# Patient Record
Sex: Female | Born: 1955 | Marital: Married | Smoking: Former smoker
Health system: Southern US, Community
[De-identification: ages and names within clinical notes are randomized; demographics above are authoritative.]

## PROBLEM LIST (undated history)

## (undated) DIAGNOSIS — A159 Respiratory tuberculosis unspecified: Secondary | ICD-10-CM

## (undated) DIAGNOSIS — K219 Gastro-esophageal reflux disease without esophagitis: Secondary | ICD-10-CM

## (undated) DIAGNOSIS — K649 Unspecified hemorrhoids: Secondary | ICD-10-CM

## (undated) DIAGNOSIS — E119 Type 2 diabetes mellitus without complications: Secondary | ICD-10-CM

## (undated) DIAGNOSIS — D509 Iron deficiency anemia, unspecified: Secondary | ICD-10-CM

## (undated) DIAGNOSIS — IMO0002 Reserved for concepts with insufficient information to code with codable children: Secondary | ICD-10-CM

## (undated) DIAGNOSIS — G4730 Sleep apnea, unspecified: Secondary | ICD-10-CM

## (undated) DIAGNOSIS — R87619 Unspecified abnormal cytological findings in specimens from cervix uteri: Secondary | ICD-10-CM

## (undated) HISTORY — PX: OTHER SURGICAL HISTORY: SHX169

## (undated) HISTORY — DX: Reserved for concepts with insufficient information to code with codable children: IMO0002

## (undated) HISTORY — DX: Type 2 diabetes mellitus without complications: E11.9

## (undated) HISTORY — DX: Unspecified abnormal cytological findings in specimens from cervix uteri: R87.619

## (undated) HISTORY — DX: Gastro-esophageal reflux disease without esophagitis: K21.9

## (undated) HISTORY — DX: Respiratory tuberculosis unspecified: A15.9

## (undated) HISTORY — DX: Unspecified hemorrhoids: K64.9

## (undated) HISTORY — DX: Sleep apnea, unspecified: G47.30

## (undated) HISTORY — PX: ESOPHAGOGASTRODUODENOSCOPY: SHX1529

## (undated) HISTORY — DX: Iron deficiency anemia, unspecified: D50.9

---

## 2011-08-01 ENCOUNTER — Other Ambulatory Visit: Payer: Self-pay | Admitting: Infectious Diseases

## 2011-08-01 ENCOUNTER — Ambulatory Visit
Admission: RE | Admit: 2011-08-01 | Discharge: 2011-08-01 | Disposition: A | Discharge status: Home / Self Care | Payer: No Typology Code available for payment source | Source: Ambulatory Visit | Attending: Infectious Diseases | Admitting: Infectious Diseases

## 2011-08-01 DIAGNOSIS — R7611 Nonspecific reaction to tuberculin skin test without active tuberculosis: Secondary | ICD-10-CM

## 2011-08-13 ENCOUNTER — Other Ambulatory Visit (HOSPITAL_COMMUNITY)
Admission: RE | Admit: 2011-08-13 | Discharge: 2011-08-13 | Disposition: A | Discharge status: Home / Self Care | Payer: Medicaid Other | Source: Ambulatory Visit | Attending: Family Medicine | Admitting: Family Medicine

## 2011-08-13 ENCOUNTER — Ambulatory Visit (INDEPENDENT_AMBULATORY_CARE_PROVIDER_SITE_OTHER): Payer: Medicaid Other | Admitting: Sports Medicine

## 2011-08-13 VITALS — BP 113/81 | HR 96 | Temp 99.4°F | Wt 123.4 lb

## 2011-08-13 DIAGNOSIS — Z124 Encounter for screening for malignant neoplasm of cervix: Secondary | ICD-10-CM

## 2011-08-13 DIAGNOSIS — N888 Other specified noninflammatory disorders of cervix uteri: Secondary | ICD-10-CM

## 2011-08-13 DIAGNOSIS — Z0000 Encounter for general adult medical examination without abnormal findings: Secondary | ICD-10-CM

## 2011-08-13 DIAGNOSIS — Z01419 Encounter for gynecological examination (general) (routine) without abnormal findings: Secondary | ICD-10-CM | POA: Insufficient documentation

## 2011-08-13 DIAGNOSIS — R1084 Generalized abdominal pain: Secondary | ICD-10-CM

## 2011-08-13 DIAGNOSIS — Z1159 Encounter for screening for other viral diseases: Secondary | ICD-10-CM | POA: Insufficient documentation

## 2011-08-13 DIAGNOSIS — G8929 Other chronic pain: Secondary | ICD-10-CM

## 2011-08-13 MED ORDER — POLYETHYLENE GLYCOL 3350 17 GM/SCOOP PO POWD: 17.0000 g | Freq: Two times a day (BID) | ORAL | Status: AC | PRN

## 2011-08-13 MED ORDER — OMEPRAZOLE 20 MG PO CPDR: 20.0000 mg | DELAYED_RELEASE_CAPSULE | Freq: Every day | ORAL | Status: DC

## 2011-08-27 ENCOUNTER — Encounter: Payer: Self-pay | Admitting: Sports Medicine

## 2011-08-27 DIAGNOSIS — Z0000 Encounter for general adult medical examination without abnormal findings: Secondary | ICD-10-CM | POA: Insufficient documentation

## 2011-08-27 DIAGNOSIS — G8929 Other chronic pain: Secondary | ICD-10-CM | POA: Insufficient documentation

## 2011-08-27 DIAGNOSIS — N841 Polyp of cervix uteri: Secondary | ICD-10-CM | POA: Insufficient documentation

## 2011-08-27 NOTE — Progress Notes (Signed)
Patient ID: Sandra Ryan, female   DOB: August 24, 1955, 56 y.o.   MRN: [id number] HPI:  Sandra Ryan is a 56 y.o. female presenting today to establish care.  She is a recent immigrant from [place].  Pt reports difficulty with visual acuity and chronic stomach pains.  Past medical history is significant for sleep apnea per report however patient is unable to elaborate on this.  Patient is interviewed with interpretation services via [facility] interpreters.  Patient reports otherwise being healthy with no chronic medication use no prior surgeries no prior hospitalizations.   She reports that her vision changes have been progressively worsening and are worse with reading. Her abdominal pain is very nonspecific but is reported more as lower abdominal cramping pain.  She does have some associated straining with defecation.  No black tarry stools.  No hematochezia.  She is postmenopausal with no vaginal bleeding.  She has not had a Pap smear.   ROS Denies fevers chills, sick contacts.  No orthopnea no dyspnea on exertion  HISTORY Medications Reviewed & Updated, see associated section Medical Hx Reviewed: Significant for questionable sleep apnea Family History Reviewed: Per associated section and updated Social History Reviewed:  Significant for nonsmoker however exposure to domestic would burning cooking  PE: GENERAL:  Adult [place] female.  Examined in [facility].   In no discomfort; norespiratory distress.   PSYCH: Alert and appropriately interactive; H&N: AT/[place], MMM, no scleral icterus, EOMi THORAX: HEART: RRR, S1/S2 heard, no murmur LUNGS: CTA B, no wheezes, no crackles ABDOMEN:  +BS, soft, non-tender, no rigidity, no guarding, palpable stool burden but no other masses EXTREMITIES: Moves all 4 extremities spontaneously, warm well perfused, no edema, bilateral DP and PT pulses 2/4.   >PELVIC/RECTAL: Normal external female genitalia.  Vagina normal.  Cervix with cyst at the 9:00 position not friable.  No  discharge no bleeding.  Rectal exam was guaiac-negative

## 2011-08-27 NOTE — Assessment & Plan Note (Signed)
Patient reports 4 month duration of this pain.  Worse in the last 5 days.  Probable stool burden on exam.  We'll treat for both reflux presumptively as well as chronic constipation.  Patient be started on MiraLax and omeprazole.  Followup in one month

## 2011-08-27 NOTE — Assessment & Plan Note (Signed)
Screening blood work at next appointment after establishing [employer].

## 2011-08-27 NOTE — Assessment & Plan Note (Signed)
PAP pending, no bleeding, asymptomatic

## 2011-10-03 ENCOUNTER — Encounter: Payer: Self-pay | Admitting: Sports Medicine

## 2011-11-28 ENCOUNTER — Ambulatory Visit (INDEPENDENT_AMBULATORY_CARE_PROVIDER_SITE_OTHER): Payer: Medicaid Other | Admitting: Family Medicine

## 2011-11-28 ENCOUNTER — Encounter: Payer: Self-pay | Admitting: Family Medicine

## 2011-11-28 VITALS — BP 129/74 | HR 86 | Temp 98.8°F | Wt 125.0 lb

## 2011-11-28 DIAGNOSIS — R87610 Atypical squamous cells of undetermined significance on cytologic smear of cervix (ASC-US): Secondary | ICD-10-CM

## 2011-11-28 DIAGNOSIS — M719 Bursopathy, unspecified: Secondary | ICD-10-CM

## 2011-11-28 DIAGNOSIS — M75102 Unspecified rotator cuff tear or rupture of left shoulder, not specified as traumatic: Secondary | ICD-10-CM | POA: Insufficient documentation

## 2011-11-28 NOTE — Progress Notes (Signed)
Patient ID: Sandra Ryan, female   DOB: 05-Apr-1955, 56 y.o.   MRN: [id number] Patient originally scheduled for colposcopy. Last Pap smear was ASCUS. Her high risk HPV was negative. By the current guidelines she does not need colposcopy but should have repeat testing in 3 years.  I discussed this with her using her son as an interpreter. She seemed quite happy that she was not going to have to have the procedure today. She has been said to look at her left shoulder which is some bothering her more in the last 5 or 6 months. About 25 years ago she had an injury where she was trying to tie up a towel in the caliber of her one way while she was going the other. She has some left shoulder pain at that time and has had mild intermittent problems since. In the last 5-6 months the problems have been increased in severity, symptoms worse with reaching overhead or backward using her left arm. She is right-hand dominant. She's not had weakness in the arm. She has not noted any nighttime pain.  SUBJECTIVE: Vital signs are reviewed SHOULDER: Bilaterally shoulders have full range of motion. She has some pain with resisted supraspinatus testing and she has positive impingement sign as demonstrated by the Hawkins test. She is neurovascularly intact. She has no pain with internal rotation and only mild pain with external rotation.  ASSESSMENT: PLAN: #1. Abnormal Pap smear. ASCUS. The HPV test for high-risk strength was negative. She does not require colposcopy. #2. Left shoulder pain most consistent with subacromial bursitis, possibly some mild supraspinatus tendinopathy. I discussed exercise program versus corticosteroid injection with her using her son as an interpreter. She chose corticosteroid injection.  INJECTION: Patient was given informed consent, signed copy in the chart. Appropriate time out was taken. Area prepped and draped in usual sterile fashion. One cc of methylprednisolone 40 mg/ml plus  4 cc of 1%  lidocaine without epinephrine was injected into the left shoulder subacromial bursa using a(n) posterior approach. The patient tolerated the procedure well. There were no complications. Post procedure instructions were given.  #3. She also has some questions about her vision being occasionally blurry. She was seen today in women's health clinic/procedure clinic, I did not feel we could address that adequately and have referred her back to her PCP Dr. Berline Chough.

## 2011-12-24 ENCOUNTER — Ambulatory Visit (INDEPENDENT_AMBULATORY_CARE_PROVIDER_SITE_OTHER): Payer: Medicaid Other | Admitting: Sports Medicine

## 2011-12-24 ENCOUNTER — Encounter: Payer: Self-pay | Admitting: Sports Medicine

## 2011-12-24 VITALS — BP 108/64 | HR 73 | Temp 98.7°F | Wt 124.2 lb

## 2011-12-24 DIAGNOSIS — H538 Other visual disturbances: Secondary | ICD-10-CM

## 2011-12-24 DIAGNOSIS — M719 Bursopathy, unspecified: Secondary | ICD-10-CM

## 2011-12-24 DIAGNOSIS — Z0000 Encounter for general adult medical examination without abnormal findings: Secondary | ICD-10-CM

## 2011-12-24 DIAGNOSIS — M75102 Unspecified rotator cuff tear or rupture of left shoulder, not specified as traumatic: Secondary | ICD-10-CM

## 2011-12-24 DIAGNOSIS — N950 Postmenopausal bleeding: Secondary | ICD-10-CM

## 2011-12-24 MED ORDER — MELOXICAM 7.5 MG PO TABS: 7.5000 mg | ORAL_TABLET | Freq: Every day | ORAL | Status: DC

## 2011-12-24 NOTE — Patient Instructions (Addendum)
[facility] am sending [facility] for an ultrasound of your uterus to evaluate for your bleeding. I am also sending you to the eye doctor.  You will hear from their office.  Follow up with me in 1-2 weeks for your endometrial biopsy.   Pick up your prescription for your shoulder pain.

## 2011-12-26 ENCOUNTER — Other Ambulatory Visit: Payer: Self-pay | Admitting: Sports Medicine

## 2011-12-26 ENCOUNTER — Ambulatory Visit (HOSPITAL_COMMUNITY)
Admission: RE | Admit: 2011-12-26 | Discharge: 2011-12-26 | Disposition: A | Discharge status: Home / Self Care | Payer: Medicaid Other | Source: Ambulatory Visit | Attending: Family Medicine | Admitting: Family Medicine

## 2011-12-26 DIAGNOSIS — N950 Postmenopausal bleeding: Secondary | ICD-10-CM | POA: Insufficient documentation

## 2012-01-02 DIAGNOSIS — N950 Postmenopausal bleeding: Secondary | ICD-10-CM | POA: Insufficient documentation

## 2012-01-02 NOTE — Progress Notes (Signed)
  [facility]  Patient name: Sandra Ryan MRN [id number]  Date of birth: Aug 15, 1955  CC & HPI:  Sandra Ryan is a 56 y.o. female presenting today for follow up of L shoulder pain and to discuss her ongoing health care issues:  Left shoulder has been bothering her for a extremely long time.  This was evaluated by Dr. Jennette Kettle and injected previously.  It did give her some relief for approximately one month however is continuing to bother her.  She has limited range of motion due to pain is done nothing else for it other than the injection previously.  She does report that she is having some postmenopausal bleeding.  This is been occurring for approximately the past 2 years however was not volunteered during the last exam although directly asked.  She reports intermittent bleeding sometimes requiring 1-2 pads that will spontaneous stop on its own.   She does report some blurry vision.  Has not had an eye exam recently.   ROS:  No dizziness, no lightheadedness, no melena, no hematochezia  Pertinent History Reviewed:  Medical & Surgical Hx:  Reviewed: Significant for ascus with negative HPV, needs repeat Pap in 3 years. Medications: Reviewed & Updated - see associated section Social History: Reviewed - Significant for nonsmoker however history of domestic with burning cooking  Objective Findings:  Vitals:  Filed Vitals:   12/24/11 1039  BP: 108/64  Pulse: 73  Temp: 98.7 F (37.1 C)    PE: GENERAL:  Adult [place] female. In no discomfort; no respiratory distress. PSYCH: Alert and appropriately interactive   H&N: AT/[employer], trachea midline EENT:  MMM, no scleral icterus, EOMi HEART: RRR, S1/S2 heard, no murmur LUNGS: CTA B, no wheezes, no crackles EXTREMITIES: Moves all 4 extremities spontaneously, warm well perfused, no edema, bilateral DP and PT pulses 2/4.   MSK: Left shoulder positive Hawkins positive cross arm test, negative empty can, full passive range of motion,  active range of motion limited in overhead abduction, negative drop arm test    Assessment & Plan:

## 2012-01-02 NOTE — Assessment & Plan Note (Signed)
Needs blood work, screening colonoscopy, now that she has Medicaid. Patient will followup in 2-3 weeks to discuss these things

## 2012-01-02 NOTE — Assessment & Plan Note (Signed)
Will obtain transvaginal ultrasound as well as perform endometrial biopsy at next office visit. Discuss this with the patient she is agreeable and understanding.  This could likely be coming from her endocervical cyst that was noted on Pap however given her age and postmenopausal state, bleeding needs  further evaluation

## 2012-01-02 NOTE — Assessment & Plan Note (Signed)
Naprosyn Rx provided

## 2012-01-07 ENCOUNTER — Encounter: Payer: Self-pay | Admitting: Sports Medicine

## 2012-01-07 ENCOUNTER — Ambulatory Visit (INDEPENDENT_AMBULATORY_CARE_PROVIDER_SITE_OTHER): Payer: Medicaid Other | Admitting: Sports Medicine

## 2012-01-07 VITALS — BP 141/86 | HR 95 | Temp 97.7°F | Wt 124.0 lb

## 2012-01-07 DIAGNOSIS — H538 Other visual disturbances: Secondary | ICD-10-CM

## 2012-01-07 DIAGNOSIS — N8111 Cystocele, midline: Secondary | ICD-10-CM

## 2012-01-07 DIAGNOSIS — N888 Other specified noninflammatory disorders of cervix uteri: Secondary | ICD-10-CM

## 2012-01-07 DIAGNOSIS — N950 Postmenopausal bleeding: Secondary | ICD-10-CM

## 2012-01-07 DIAGNOSIS — IMO0002 Reserved for concepts with insufficient information to code with codable children: Secondary | ICD-10-CM

## 2012-01-07 NOTE — Patient Instructions (Addendum)
[facility] with a Gynecologist regarding your cystocele (bladder prolapsing).  This is likely what is contributing to her discomfort.  We will share our results with them regarding the endometrial biopsy and ultrasound.  Please follow up with them as schedule.  Return to see me as needed or in 1 months  Endometrial Biopsy This is a test in which a tissue sample (a biopsy) is taken from inside the uterus (womb). It is then looked at by a specialist under a microscope to see if the tissue is normal or abnormal. The endometrium is the lining of the uterus. This test helps determine where you are in your menstrual cycle and how hormone levels are affecting the lining of the uterus. Another use for this test is to diagnose endometrial cancer, tuberculosis, polyps, or inflammatory conditions and to evaluate uterine bleeding. PREPARATION FOR TEST No preparation or fasting is necessary. NORMAL FINDINGS No pathologic conditions. Presence of "secretory-type" endometrium 3 to 5 days before to normal menstruation. Ranges for normal findings may vary among different laboratories and hospitals. You should always check with your doctor after having lab work or other tests done to discuss the meaning of your test results and whether your values are considered within normal limits. MEANING OF TEST  Your caregiver will go over the test results with you and discuss the importance and meaning of your results, as well as treatment options and the need for additional tests if necessary. OBTAINING THE TEST RESULTS It is your responsibility to obtain your test results. Ask the lab or department performing the test when and how you will get your results. Document Released: 07/12/2004 Document Revised: 06/03/2011 Document Reviewed: 02/19/2008 [facility] Patient Information 2013 [place], [place].

## 2012-01-08 ENCOUNTER — Encounter: Payer: Self-pay | Admitting: Obstetrics & Gynecology

## 2012-01-08 DIAGNOSIS — H538 Other visual disturbances: Secondary | ICD-10-CM | POA: Insufficient documentation

## 2012-01-08 DIAGNOSIS — IMO0002 Reserved for concepts with insufficient information to code with codable children: Secondary | ICD-10-CM | POA: Insufficient documentation

## 2012-01-08 NOTE — Assessment & Plan Note (Signed)
persistent bleeding on exam today.  Cervical cyst likely contributor but thickened endometrial stripe and blood inside cervical os.   - Endometrial biopsy performed today.  Results pending

## 2012-01-08 NOTE — Assessment & Plan Note (Signed)
Likely contributing to bleeding.  Deferred removal today as prominent and would not easily release from cervix >consider removal at GYN

## 2012-01-08 NOTE — Assessment & Plan Note (Addendum)
No referal available for blurry vision through medicaid.   > discuss with patient at next visit, will likely need to be [facility] or pay Out of pocket for exam

## 2012-01-08 NOTE — Progress Notes (Signed)
  [facility]  Patient name: Sandra Ryan MRN [id number]  Date of birth: Aug 06, 1955  CC & HPI:  Sandra Ryan is a 56 y.o. female presenting today for endometrial biopsy for post menopausal bleeding.  # persistent bleeding but light  # family reports that since her last visit she has been found in the bathroom crying due to "something coming out" of her vagina following having a bowel movement.  Able to be reduced but causing pt distress.    ROS:  No dizziness or lightheadedness, no diaphoresis, no chest pain  Pertinent History Reviewed:  Medical & Surgical Hx:  Reviewed: Significant for ASCUS (HPV neg) on PAP (needs repeat in 3 years) Medications: Reviewed & Updated - see associated section Social History: Reviewed - Significant for former smoker  Objective Findings:  Vitals:  Filed Vitals:   01/07/12 0954  BP: 141/86  Pulse: 95  Temp: 97.7 F (36.5 C)    PE: GENERAL:  [place] female. In no discomfort; no respiratory distress.  Both in person and [place] interpreters used for [place] language PSYCH: Alert and appropriately interactive; Insight:Good   H&N: AT/[place], trachea midline EENT:  MMM, no scleral icterus, EOMi EXTREMITIES: Moves all 4 extremities spontaneously, warm well perfused, no edema, bilateral DP and PT pulses 2/4.   Pelvic exam: VULVA: normal appearing vulva with no masses, tenderness or lesions, VAGINA: normal appearing vagina with normal color and discharge, no lesions, CERVIX: multiparous os, endocervical polyp size 0.4 cm with some bleeding, blood in cervical os, UTERUS: enlarged to 8 week's size, ADNEXA: normal adnexa in size, nontender and no masses.   Assessment & Plan:   PROCEDURE NOTE: Endometrial Biopsy Patient given informed consent, signed copy in the chart.  Appropriate time out taken. . The patient was placed in the lithotomy position and the cervix brought into view with sterile speculum.  The portio of cervix was cleansed x 2  with betadine swabs.  A tenaculum was placed in the anterior lip of the cervix.  A pipelle was introduced  into the uterus, until resistance met, suction created,  and an endometrial sample was obtained. All equipment was removed and accounted for.   The patient tolerated the procedure well.  Minimal spotting type bleeding.  Patient given post procedure instructions. I will notify her of any pathology results.  Dr. Sarah [place] present for entirety of exam

## 2012-01-08 NOTE — Assessment & Plan Note (Signed)
Prominent cystocele on exam today - Grade 3.  ?uterine prolapse given pt history but not ilicited on exam today.  > referal to GYN for further evaluation of Cystocele, consider pessary vs sling

## 2012-01-29 ENCOUNTER — Encounter: Payer: Self-pay | Admitting: Obstetrics & Gynecology

## 2012-01-29 ENCOUNTER — Ambulatory Visit (INDEPENDENT_AMBULATORY_CARE_PROVIDER_SITE_OTHER): Payer: Medicaid Other | Admitting: Obstetrics & Gynecology

## 2012-01-29 VITALS — BP 124/90 | HR 90 | Temp 99.7°F | Resp 20 | Ht 60.5 in | Wt 124.6 lb

## 2012-01-29 DIAGNOSIS — R350 Frequency of micturition: Secondary | ICD-10-CM

## 2012-01-29 DIAGNOSIS — R300 Dysuria: Secondary | ICD-10-CM

## 2012-01-29 DIAGNOSIS — K649 Unspecified hemorrhoids: Secondary | ICD-10-CM

## 2012-01-29 LAB — HEMOGLOBIN A1C
Hgb A1c MFr Bld: 13.3 % — ABNORMAL HIGH (ref <5.7)
Mean Plasma Glucose: 335 mg/dL — ABNORMAL HIGH (ref <117)

## 2012-01-29 LAB — POCT URINALYSIS DIP (DEVICE)
Bilirubin Urine: NEGATIVE
Glucose, UA: 500 mg/dL — AB
Ketones, ur: NEGATIVE mg/dL
Leukocytes, UA: NEGATIVE
Protein, ur: NEGATIVE mg/dL
Specific Gravity, Urine: <=1.005 (ref 1.005–1.030)

## 2012-01-29 NOTE — Patient Instructions (Signed)
Metrorrhagia   Metrorrhagia is uterine bleeding at irregular intervals, especially between menstrual periods.   CAUSES    Dysfunctional uterine bleeding.   Uterine lining growing outside the uterus (endometriosis).   Embryo adhering to uterine wall (implantation).   Pregnancy growing in the fallopian tubes (ectopic pregnancy).   Miscarriage.   Menopause.   Cancer of the reproduction organs.   Certain drugs such as hormonal contraceptives.   Inherited bleeding disorders.   Trauma.   Uterine fibroids.   Sexually transmitted diseases (STDs).   Polycystic ovarian disease.  DIAGNOSIS   A history will be taken.   A physical exam will be performed.   Other tests may include:   Blood tests.   A pregnancy test.   An ultrasound of the abdomen and pelvis.   A biopsy of the uterine lining.   AMRI or CT scan of the abdomen and pelvis.  TREATMENT  Treatment will depend on the cause.  HOME CARE INSTRUCTIONS    Take all medicines as directed by your caregiver. Do not change or switch medicines without talking to your caregiver.   Take all iron supplements exactly as directed by your caregiver. Iron supplements help to replace the iron your body loses from irregular bleeding.If you become constipated, increase the amount of fiber, fruits, and vegetables in your diet.   Do not take aspirin or medicines that contain aspirin for 1 week before your menstrual period or during your menstrual period. Aspirin may increase the bleeding.   Rest as much as possible if you change your sanitary pad or tampon more than once every 2 hours.   Eat well-balanced meals including foods high in iron, such as green leafy vegetables, red meat, liver, eggs, and whole-grain breads and cereals.   Do not try to lose weight until the abnormal bleeding is controlled and your blood iron level is back to normal.  SEEK MEDICAL CARE IF:    You have nausea and vomiting, or you cannot keep foods down.   You feel dizzy or have diarrhea  while taking medicine.   You have any problems that may be related to the medicine you are taking.  SEEK IMMEDIATE MEDICAL CARE IF:    You have a fever.   You develop chills.   You become lightheaded or faint.   You need to change your sanitary pad or tampon more than once an hour.   Your bleeding becomesheavy.   You begin to pass clots or tissue.  MAKE SURE YOU:    Understand these instructions.   Will watch your condition.   Will get help right away if you are not doing well or get worse.  Document Released: 03/11/2005 Document Revised: 06/03/2011 Document Reviewed: 10/08/2010  [employer] Patient Information 2013 [employer].

## 2012-01-29 NOTE — Progress Notes (Signed)
Subjective:     Patient ID: Sandra Ryan, female   DOB: 09-Nov-1955, 56 y.o.   MRN: [id number]  HPI Pt was referred for evaluation of 'cystocele' and cyst on cervix.  Pt reports that she is her for evaluation of her hemorrhoids.  She does c/o some leakage of urine.  At end of visit it was noted that pt had a sono for 'post menopausal bleeding' sono showed thickened endometrial.   Pt reports that she has not had bleeding for 3 weeks   Review of Systems     Objective:   Physical Exam BP 124/90  Pulse 90  Temp 99.7 F (37.6 C) (Oral)  Resp 20  Ht 5' 0.5" (1.537 m)  Wt 124 lb 9.6 oz (56.518 kg)  BMI 23.93 kg/m2 Pt in NAD  Abd: soft, NT, ND GU: EGBUS: no lesions Vagina: no blood in vault; no cystocele noted Cervix: no lesion; no mucopurulent d/c; benign Nabothian cyst seen on cervix Uterus: small, mobile Adnexa: no masses; non tender   UA: >500 glc  5/13 PAP: ASCUS/ neg HR HPV [place] sono 12/26/11 for Post menopausal bleeding- thickened endometrium 5.83mm    Assessment:     Dysuria hemmorrhoids   Plan:     Miralax 1 capful bid x 2 weeks then 1 capful q day Hydrocortisone 1% + preparation H mixed to rectum tid when hemorrhoids are symptomatic rec f/u for endobx HbA1c today  Isidoro Santillana L. Harraway-Smith, M.D., [facility]

## 2012-01-29 NOTE — Progress Notes (Signed)
Initial assessment and information obtained using [place] interpreter # [phone].  Pt and daughter state that they though this appt was for evaluation of the pt's hemorrhoids. She is unaware of the prolapsed bladder.

## 2012-01-31 ENCOUNTER — Ambulatory Visit (INDEPENDENT_AMBULATORY_CARE_PROVIDER_SITE_OTHER): Payer: Medicaid Other | Admitting: Sports Medicine

## 2012-01-31 ENCOUNTER — Encounter: Payer: Self-pay | Admitting: Sports Medicine

## 2012-01-31 VITALS — BP 123/82 | HR 108 | Temp 97.4°F | Wt 123.0 lb

## 2012-01-31 DIAGNOSIS — E1140 Type 2 diabetes mellitus with diabetic neuropathy, unspecified: Secondary | ICD-10-CM

## 2012-01-31 DIAGNOSIS — Z0000 Encounter for general adult medical examination without abnormal findings: Secondary | ICD-10-CM

## 2012-01-31 DIAGNOSIS — IMO0002 Reserved for concepts with insufficient information to code with codable children: Secondary | ICD-10-CM

## 2012-01-31 DIAGNOSIS — K219 Gastro-esophageal reflux disease without esophagitis: Secondary | ICD-10-CM

## 2012-01-31 DIAGNOSIS — E1142 Type 2 diabetes mellitus with diabetic polyneuropathy: Secondary | ICD-10-CM

## 2012-01-31 DIAGNOSIS — E1165 Type 2 diabetes mellitus with hyperglycemia: Secondary | ICD-10-CM | POA: Insufficient documentation

## 2012-01-31 DIAGNOSIS — E1149 Type 2 diabetes mellitus with other diabetic neurological complication: Secondary | ICD-10-CM

## 2012-01-31 DIAGNOSIS — M719 Bursopathy, unspecified: Secondary | ICD-10-CM

## 2012-01-31 DIAGNOSIS — M75102 Unspecified rotator cuff tear or rupture of left shoulder, not specified as traumatic: Secondary | ICD-10-CM

## 2012-01-31 LAB — POCT UA - MICROALBUMIN
Creatinine, POC: 10 mg/dL
Microalbumin Ur, POC: 10 mg/dL

## 2012-01-31 MED ORDER — OMEPRAZOLE 20 MG PO CPDR: 20.0000 mg | DELAYED_RELEASE_CAPSULE | Freq: Every day | ORAL | Status: DC

## 2012-01-31 MED ORDER — METFORMIN HCL 500 MG PO TABS: 500.0000 mg | ORAL_TABLET | Freq: Two times a day (BID) | ORAL | Status: DC

## 2012-01-31 MED ORDER — INSULIN PEN NEEDLE 31G X 5 MM MISC: Status: DC

## 2012-01-31 MED ORDER — NAPROXEN 500 MG PO TABS: 500.0000 mg | ORAL_TABLET | Freq: Two times a day (BID) | ORAL | Status: DC | PRN

## 2012-01-31 MED ORDER — ACCU-CHEK NANO SMARTVIEW W/DEVICE KIT: 1.0000 | PACK | Freq: Three times a day (TID) | Status: DC

## 2012-01-31 MED ORDER — INSULIN GLARGINE 100 UNIT/ML ~~LOC~~ SOLN: 10.0000 [IU] | SUBCUTANEOUS | Status: DC

## 2012-01-31 NOTE — Assessment & Plan Note (Signed)
Pt on prilosec earlier, restarted

## 2012-01-31 NOTE — Assessment & Plan Note (Signed)
HbA1c 13.3 @ OB office.   Will start Metformin 500 bid & Lantus 10U qam Pharmacist in clinic reviewed Insulin use and s/sx of hypoglycemia >titrate metformin up  >titrate Lantus  >risk stratification labs >start CBGs at next visit >f/u in pharmacy clinic to ensure injection therapy is going well >f/u with me in 1-2 weeks

## 2012-01-31 NOTE — Assessment & Plan Note (Signed)
Pt will hold off on filling Omeprazole, Naproxen and Neurontin as insulin is more important and want to ensure compliance. Needs to fill after next visit

## 2012-01-31 NOTE — Assessment & Plan Note (Signed)
aleeve prn >reconsider injection >consider PT refer

## 2012-01-31 NOTE — Assessment & Plan Note (Signed)
>  neurontin 100mg  po tid prn

## 2012-01-31 NOTE — Patient Instructions (Addendum)
It was good to see you.  You have diabetes we are starting you on insulin take this each morning and metformin. Please ONLY FILL YOUR:  INSULIN & NEEDLES  METFORMIN  TEST STRIPS - test up to 3 times per day  Follow up with Dr. Raymondo Band next weeks Follow up with me in 1-2 weeks.

## 2012-02-01 LAB — BASIC METABOLIC PANEL
Calcium: 9.5 mg/dL (ref 8.4–10.5)
Glucose, Bld: 401 mg/dL — ABNORMAL HIGH (ref 70–99)
Potassium: 4.3 mEq/L (ref 3.5–5.3)
Sodium: 134 mEq/L — ABNORMAL LOW (ref 135–145)

## 2012-02-12 ENCOUNTER — Other Ambulatory Visit (HOSPITAL_COMMUNITY)
Admission: RE | Admit: 2012-02-12 | Discharge: 2012-02-12 | Disposition: A | Discharge status: Home / Self Care | Payer: Medicaid Other | Source: Ambulatory Visit | Attending: Obstetrics & Gynecology | Admitting: Obstetrics & Gynecology

## 2012-02-12 ENCOUNTER — Other Ambulatory Visit: Payer: Self-pay | Admitting: Sports Medicine

## 2012-02-12 ENCOUNTER — Encounter: Payer: Self-pay | Admitting: Obstetrics & Gynecology

## 2012-02-12 ENCOUNTER — Ambulatory Visit (INDEPENDENT_AMBULATORY_CARE_PROVIDER_SITE_OTHER): Payer: Medicaid Other | Admitting: Obstetrics & Gynecology

## 2012-02-12 VITALS — BP 146/92 | HR 93 | Temp 98.6°F | Resp 12 | Wt 123.4 lb

## 2012-02-12 DIAGNOSIS — N950 Postmenopausal bleeding: Secondary | ICD-10-CM

## 2012-02-12 LAB — POCT PREGNANCY, URINE: Preg Test, Ur: NEGATIVE

## 2012-02-12 MED ORDER — ACCU-CHEK AVIVA PLUS W/DEVICE KIT: 1.0000 | PACK | Freq: Three times a day (TID) | Status: DC

## 2012-02-12 NOTE — Progress Notes (Signed)
Patient ID: Sandra Ryan, female   DOB: [birth year], 56 y.o.   MRN: [id number] Pt with a h/o PMPB 1 month previously.  None sine that time.  She was recently dx'd with DM and started on meds.  She reports that she has not been checking her glucose.   The indications for endometrial biopsy were reviewed.   Risks of the biopsy including cramping, bleeding, infection, uterine perforation, inadequate specimen and need for additional procedures  were discussed. The patient states she understands and agrees to undergo procedure today. Consent was signed. Time out was performed. Urine HCG was negative. A sterile speculum was placed in the patient's vagina and the cervix was prepped with Betadine. Hurricane spray was applied to cervix.  A single-toothed tenaculum was placed on the anterior lip of the cervix to stabilize it. The 3 mm pipelle was introduced into the endometrial cavity without difficulty to a depth of 7cm, and a small amount of tissue was obtained and sent to pathology. The instruments were removed from the patient's vagina. Minimal bleeding from the cervix was noted. The patient tolerated the procedure well. Routine post-procedure instructions were given to the patient. The patient will follow up in 2-3 weeks to review the results and for further management.    Note sent to Dr. Berline Chough regarding glc monitoring.  Adelyne Marchese L. Harraway-Smith, M.D., [facility]

## 2012-02-13 ENCOUNTER — Encounter: Payer: Self-pay | Admitting: Sports Medicine

## 2012-02-13 NOTE — Progress Notes (Signed)
  [facility]  Patient name: Sandra Ryan MRN [id number]  Date of birth: 12/10/1955  CC & HPI:  Sandra Ryan is a 56 y.o. female presenting today for follow up of:  # Newly Diagnosed DM:  A1c @ OB secondary to urinalysis with glucose.   Previously symptoms consistent with Diabetes know with better understanding and improver interpretation of symptoms via in person [place] interperter Started on metformin at low dose DIABETES: further diabetic ROS: no chest pain, dyspnea or TIA's, no medication side effects noted, has noted excessive thirstiness and frequent urination, has dysesthesias in the feet, blurry vision   # Shoulder Pain: continues to bother her from prior injection with Dr. Jennette Kettle.  Some relief X3 weeks but now returned.    # LE pain: ?burning sensation in B LE, gloves and stocking distribution  # post menopausal bleeding - pt will follow up with Dr. Dolan Amen again in near future   ROS:  PER HPI  Pertinent History Reviewed:  Medical & Surgical Hx:  Reviewed: Significant for Blurry vision, chronic abdominal pain, Post menopausal bleeding Medications: Reviewed & Updated - see associated section Social History: Reviewed -  reports that she has quit smoking. She has never used smokeless tobacco.   Objective Findings:  Vitals:  Filed Vitals:   01/31/12 1516  BP: 123/82  Pulse: 108  Temp: 97.4 F (36.3 C)    PE: GENERAL:  Adult [place] female.  Appearance > actual age.  In no discomfort; no respiratory distress. Interpreter present PSYCH: Alert and appropriately interactive; Insight:Fair; low medical literacy   H&N: AT/[place], trachea midline EENT:  MMM, no scleral icterus, EOMi HEART: RRR, S1/S2 heard, no murmur LUNGS: CTA B, no wheezes, no crackles EXTREMITIES: Moves all 4 extremities spontaneously, warm well perfused, no edema, bilateral DP and PT pulses 2/4.   LUE: guarding of L shoulder,  +empty can, +cross arm, + hawkins,  Limitation in active shoulder  external rotation, flexion, extension.  ~10 in all planes passively  Assessment & Plan:

## 2012-02-14 ENCOUNTER — Encounter: Payer: Self-pay | Admitting: Pharmacist

## 2012-02-14 ENCOUNTER — Ambulatory Visit (INDEPENDENT_AMBULATORY_CARE_PROVIDER_SITE_OTHER): Payer: Medicaid Other | Admitting: Pharmacist

## 2012-02-14 VITALS — BP 134/80 | HR 99 | Ht 61.0 in | Wt 124.6 lb

## 2012-02-14 DIAGNOSIS — IMO0002 Reserved for concepts with insufficient information to code with codable children: Secondary | ICD-10-CM

## 2012-02-14 DIAGNOSIS — E1165 Type 2 diabetes mellitus with hyperglycemia: Secondary | ICD-10-CM

## 2012-02-14 MED ORDER — GLUCOSE BLOOD VI STRP: ORAL_STRIP | Status: DC

## 2012-02-14 MED ORDER — ACCU-CHEK NANO SMARTVIEW W/DEVICE KIT: 1.0000 | PACK | Freq: Once | Status: DC

## 2012-02-14 MED ORDER — METFORMIN HCL 500 MG PO TABS: 500.0000 mg | ORAL_TABLET | Freq: Three times a day (TID) | ORAL | Status: DC

## 2012-02-14 MED ORDER — LANCING DEVICE MISC: 1.0000 | Freq: Two times a day (BID) | Status: DC

## 2012-02-14 NOTE — Assessment & Plan Note (Signed)
Diabetes of unknown yrs duration (newly diagnosed November 2013)  currently under poor control of blood glucose based on   Lab Results  Component Value Date   HGBA1C 13.3* 01/29/2012    ,home fasting CBG readings of (unknown) and random CBG readings of (unknown) due to  Inability to monitor sugars as "pharmacy did not have her accu check aviva monitor". Denies hypoglycemic events.  Able to verbalize appropriate hypoglycemia management plan. Increased dose of basal insulin Lantus (insulin glargine) to 15 units every morning. Increased frequency of  Metfomin 500 mg to three times daily with meals.  Written patient instructions provided. Follow up with Dr. Berline Chough in 2weeks and follow up in  Pharmacist Clinic Visit in 4 weeks.   Total time in face to face counseling 60 minutes.  Patient seen with Maryanna Shape PharmD, Pharmacy Resident. Marland Kitchen

## 2012-02-14 NOTE — Patient Instructions (Addendum)
Thank you for coming in.  Inject lantus insulin 15 units every morning. Take metformin 500 mg  3 times daily with meals. Test blood sugar in the morning when waking up and after lunch. See you in 4 weeks.

## 2012-02-14 NOTE — Progress Notes (Signed)
  Subjective:    Patient ID: Sandra Ryan, female    DOB: [birth year], 56 y.o.   MRN: [id number]  HPI  Patient arrives today in good spirits with interpreter for follow up with diabetes monitoring. She was initiated on Metformin and Lantus on last visit (01/31/12)  but expresses was unable to check her blood sugars as the pharmacy did not have her accu check aviva monitor. Random blood glucose was measured in office and read 279 mg/dl. Patient explains symptoms of nocturia,  burning on urination and tingling in her feet  that has slightly improved since starting insulin. She denies symptoms of  hypoglycemia. Blood glucose monitoring was demonstrated with Accu Chek Nano meter and patient was instructed to test twice daily.  Review of Systems     Objective:   Physical Exam  CBG in office today 279 with patient doing demonstration of adequate technique.        Assessment & Plan:   Diabetes of unknown yrs duration (newly diagnosed November 2013)  currently under poor control of blood glucose based on   Lab Results  Component Value Date   HGBA1C 13.3* 01/29/2012    ,home fasting CBG readings of (unknown) and random CBG readings of (unknown) due to  Inability to monitor sugars as "pharmacy did not have her accu check aviva monitor". Denies hypoglycemic events.  Able to verbalize appropriate hypoglycemia management plan. Increased dose of basal insulin Lantus (insulin glargine) to 15 units every morning. Increased frequency of  Metfomin 500 mg to three times daily with meals.  Written patient instructions provided. Follow up with Dr. Berline Chough in 2weeks and follow up in  Pharmacist Clinic Visit in 4 weeks.   Total time in face to face counseling 60 minutes.  Patient seen with Maryanna Shape PharmD, Pharmacy Resident. Marland Kitchen

## 2012-02-18 NOTE — Progress Notes (Signed)
Patient ID: Sandra Ryan, female   DOB: Dec 05, 1955, 56 y.o.   MRN: [id number] Reviewed and agree with Dr. Macky Lower documentation and management.

## 2012-02-24 ENCOUNTER — Ambulatory Visit (INDEPENDENT_AMBULATORY_CARE_PROVIDER_SITE_OTHER): Payer: Medicaid Other | Admitting: Sports Medicine

## 2012-02-24 ENCOUNTER — Telehealth: Payer: Self-pay | Admitting: Obstetrics and Gynecology

## 2012-02-24 ENCOUNTER — Encounter: Payer: Self-pay | Admitting: Sports Medicine

## 2012-02-24 VITALS — BP 138/87 | HR 101 | Temp 98.9°F | Ht 61.0 in | Wt 123.1 lb

## 2012-02-24 DIAGNOSIS — E1165 Type 2 diabetes mellitus with hyperglycemia: Secondary | ICD-10-CM

## 2012-02-24 DIAGNOSIS — IMO0002 Reserved for concepts with insufficient information to code with codable children: Secondary | ICD-10-CM

## 2012-02-24 DIAGNOSIS — E1142 Type 2 diabetes mellitus with diabetic polyneuropathy: Secondary | ICD-10-CM

## 2012-02-24 DIAGNOSIS — M75102 Unspecified rotator cuff tear or rupture of left shoulder, not specified as traumatic: Secondary | ICD-10-CM

## 2012-02-24 DIAGNOSIS — Z0000 Encounter for general adult medical examination without abnormal findings: Secondary | ICD-10-CM

## 2012-02-24 DIAGNOSIS — K219 Gastro-esophageal reflux disease without esophagitis: Secondary | ICD-10-CM

## 2012-02-24 DIAGNOSIS — E1140 Type 2 diabetes mellitus with diabetic neuropathy, unspecified: Secondary | ICD-10-CM

## 2012-02-24 DIAGNOSIS — E1149 Type 2 diabetes mellitus with other diabetic neurological complication: Secondary | ICD-10-CM

## 2012-02-24 DIAGNOSIS — K921 Melena: Secondary | ICD-10-CM

## 2012-02-24 DIAGNOSIS — M719 Bursopathy, unspecified: Secondary | ICD-10-CM

## 2012-02-24 LAB — GLUCOSE, CAPILLARY: Glucose-Capillary: 242 mg/dL — ABNORMAL HIGH (ref 70–99)

## 2012-02-24 MED ORDER — OMEPRAZOLE 20 MG PO CPDR: 40.0000 mg | DELAYED_RELEASE_CAPSULE | Freq: Every day | ORAL | Status: DC

## 2012-02-24 MED ORDER — METFORMIN HCL 1000 MG PO TABS: 1000.0000 mg | ORAL_TABLET | Freq: Two times a day (BID) | ORAL | Status: DC

## 2012-02-24 MED ORDER — LANCETS 30G MISC: 1.0000 | Freq: Three times a day (TID) | Status: DC

## 2012-02-24 MED ORDER — NAPROXEN 500 MG PO TABS: 500.0000 mg | ORAL_TABLET | Freq: Two times a day (BID) | ORAL | Status: DC | PRN

## 2012-02-24 MED ORDER — GABAPENTIN 100 MG PO CAPS: 100.0000 mg | ORAL_CAPSULE | Freq: Every evening | ORAL | Status: DC | PRN

## 2012-02-24 MED ORDER — INSULIN GLARGINE 100 UNIT/ML ~~LOC~~ SOLN: 20.0000 [IU] | SUBCUTANEOUS | Status: DC

## 2012-02-24 MED ORDER — LISINOPRIL 5 MG PO TABS: 5.0000 mg | ORAL_TABLET | Freq: Every day | ORAL | Status: DC

## 2012-02-24 MED ORDER — ASPIRIN 81 MG PO TBEC: 81.0000 mg | DELAYED_RELEASE_TABLET | Freq: Every day | ORAL | Status: DC

## 2012-02-24 NOTE — Telephone Encounter (Signed)
Called patient to give result message from Dr. Dolan Amen. Spoke to Son- left message to have patient or daughter call us back. Son agreed.

## 2012-02-24 NOTE — Progress Notes (Signed)
  [facility]  Patient name: Sandra Ryan MRN [id number]  Date of birth: 03/20/1956  CC & HPI:  Sandra Ryan is a 56 y.o. female presenting today for follow up of:  # DIABETES: medication compliance: compliant most of the time, diabetic diet compliance: noncompliant much of the time, home glucose monitoring: is not performed, cannot get meter to work correct, further diabetic ROS: no chest pain, dyspnea or TIA's, no hypoglycemia, has noted excessive thirstiness and frequent urination, has dysesthesias in the feet, blurry vision,   # GI:  Reports continued abdominal pain, BM with each meal, occasional melanotic stools, no meds at this time, no hematachezia  # Shoulder Pain:  Injection did not help, not taking any meds, cannot move left shoulder very well  # Foot Pain:  Worse at night, burning, no meds  ------------------------------------------------------------------------------------------------------------------ Medication Compliance: compliant most of the time  Diet Compliance: noncompliant much of the time   ROS:  PER HPI  Pertinent History Reviewed:  Medical & Surgical Hx:  Reviewed: Significant for Blurry Vision, hemorrhoids, ASCUS, post menopausal bleeding s/p 2 negative Endometrial Bx Medications: Reviewed & Updated - see associated section Social History: Reviewed -  reports that she has never smoked. She has never used smokeless tobacco.  Objective Findings:  Vitals:  Filed Vitals:   02/24/12 1457  BP: 138/87  Pulse: 101  Temp: 98.9 F (37.2 C)    PE: GENERAL:  Adult nepalese female. In no discomfort; no respiratory distress. PSYCH: Alert and appropriately interactive; Insight:Fair and lack of medical literacy   H&N: AT/[place], trachea midline EENT:  MMM, no scleral icterus, EOMi HEART: RRR, S1/S2 heard, no murmur LUNGS: CTA B, no wheezes, no crackles EXTREMITIES: Moves all 4 extremities spontaneously, warm well perfused, no edema, bilateral DP and PT pulses  2/4.  Restricted ROM in LUE.  Limited flexion, limited internal and external rotation    Assessment & Plan:

## 2012-02-24 NOTE — Assessment & Plan Note (Addendum)
?   Melanotic stools, Was hemoccult negative in november Will refer to GI for screening colonscopy

## 2012-02-24 NOTE — Assessment & Plan Note (Addendum)
Increase Lantus to 20 Units qam Start Lisinopril 5mg  po qam Increase metformin to 1000mg  bid Start ASA Monitor qac refer to Diabetes Education Single use lancets F/U 2 WEEKS >consider addition of Novolog to regimen

## 2012-02-24 NOTE — Assessment & Plan Note (Addendum)
Start neurontin >Titrate up

## 2012-02-24 NOTE — Telephone Encounter (Signed)
Message copied by Toula Moos on Mon Feb 24, 2012  1:39 PM ------      Message from: Willodean Rosenthal      Created: Mon Feb 24, 2012 11:11 AM       Please call pt (she gave [place] permission to speak with her daughter) and notify her of the normal Endo bx.            Thx,      clh-S

## 2012-02-24 NOTE — Assessment & Plan Note (Addendum)
Naproxen bid x 2 weeks > transition to prn >consider referal to [facility]

## 2012-02-24 NOTE — Assessment & Plan Note (Signed)
Start omeprazole today

## 2012-02-24 NOTE — Patient Instructions (Addendum)
It was nice to see you today.   Today we discussed: 1. Diabetes mellitus type II, uncontrolled  Increase your Insulin to 20 Units qam  I am referring you to Diabetes Education  Check your sugars with each meal and before bedtime  I am increasing you metformin and starting a new medicine to protect your kidneys  Follow up in 2 weeks  2. GERD (gastroesophageal reflux disease)  Try taking Omeprazole to help with your symptoms  3. Rotator cuff syndrome of left shoulder  Start taking the Naproxen twice a day for the next 2 weeks  4. Diabetic neuropathy, type II diabetes mellitus  Neurontin was rx today for you  5. Melanotic stools  Referred to GI  Please plan to return to see me in 2 WEEKS.  If you need anything prior to seeing me please call the clinic.  Please Bring all medications with you to each appointment.

## 2012-02-26 NOTE — Telephone Encounter (Signed)
Called pt to inform of results of endo Bx but no answer.  Pt has clinic appt tomorrow to review results and discuss further management.

## 2012-02-27 ENCOUNTER — Ambulatory Visit (INDEPENDENT_AMBULATORY_CARE_PROVIDER_SITE_OTHER): Payer: Medicaid Other | Admitting: Advanced Practice Midwife

## 2012-02-27 VITALS — BP 130/83 | HR 101 | Temp 98.4°F | Resp 12 | Wt 124.5 lb

## 2012-02-27 DIAGNOSIS — N950 Postmenopausal bleeding: Secondary | ICD-10-CM

## 2012-02-27 NOTE — Patient Instructions (Signed)
Postmenopausal Bleeding Menopause is when a woman's period stops. It is also not having periods for a total of 12 months. Postmenopausal bleeding is any bleeding after menopause. Any type of bleeding after menopause is concerning. Talk to your doctor about this. You may need medicine, hormones, surgery, or a procedure to remove tumors or growths. HOME CARE  Stay at a healthy weight.  Keep regular pelvic exams and Pap tests. GET HELP RIGHT AWAY IF:   You have a fever.  You have chills, a headache, dizziness, muscle aches, and bleeding.  You have pain with bleeding.  You have clumps of blood (blood clots) coming from your vagina.  You have bleeding and need more than 1 pad an hour.  You feel like you are going to pass out (faint).  You have more bleeding than when you were having periods.  Your bleeding lasts for more than 1 week.  You have belly (abdominal) pain.  You have bleeding after sex (intercourse). MAKE SURE YOU:  Understand these instructions.  Will watch your condition.  Will get help right away if you are not doing well or get worse. Document Released: 12/19/2007 Document Revised: 06/03/2011 Document Reviewed: 11/15/2010 [facility] Patient Information 2013 [place], [place].

## 2012-02-27 NOTE — Progress Notes (Signed)
  Subjective:    Patient ID: Sandra Ryan, female    DOB: 11-02-1955, 56 y.o.   MRN: [id number]  HPI: Here for results from EBX. Not currently bleeding.  Review of Systems: Deferred     Objective:   Physical Exam: Deferred Endometrium, biopsy - ATROPHIC APPEARING ENDOMETRIUM. - BENIGN ENDOCERVICAL AND CERVICAL MUCOSA. - THERE NO EVIDENCE OF HYPERPLASIA OR MALIGNANCY.    Assessment & Plan:   1. Postmenopausal bleeding   F/U PRN for heavy bleeding.  [place], CNM 02/27/2012 3:04 PM

## 2012-03-05 ENCOUNTER — Encounter: Payer: Self-pay | Admitting: Gastroenterology

## 2012-03-09 ENCOUNTER — Ambulatory Visit (INDEPENDENT_AMBULATORY_CARE_PROVIDER_SITE_OTHER): Payer: Medicaid Other | Admitting: Sports Medicine

## 2012-03-09 ENCOUNTER — Ambulatory Visit (HOSPITAL_COMMUNITY)
Admission: RE | Admit: 2012-03-09 | Discharge: 2012-03-09 | Disposition: A | Discharge status: Home / Self Care | Payer: Medicaid Other | Source: Ambulatory Visit | Attending: Sports Medicine | Admitting: Sports Medicine

## 2012-03-09 ENCOUNTER — Encounter: Payer: Self-pay | Admitting: Sports Medicine

## 2012-03-09 VITALS — BP 142/86 | HR 102 | Temp 99.2°F | Wt 123.0 lb

## 2012-03-09 DIAGNOSIS — E1140 Type 2 diabetes mellitus with diabetic neuropathy, unspecified: Secondary | ICD-10-CM

## 2012-03-09 DIAGNOSIS — E1142 Type 2 diabetes mellitus with diabetic polyneuropathy: Secondary | ICD-10-CM

## 2012-03-09 DIAGNOSIS — M719 Bursopathy, unspecified: Secondary | ICD-10-CM

## 2012-03-09 DIAGNOSIS — K219 Gastro-esophageal reflux disease without esophagitis: Secondary | ICD-10-CM

## 2012-03-09 DIAGNOSIS — E1149 Type 2 diabetes mellitus with other diabetic neurological complication: Secondary | ICD-10-CM

## 2012-03-09 DIAGNOSIS — R1013 Epigastric pain: Secondary | ICD-10-CM

## 2012-03-09 DIAGNOSIS — IMO0002 Reserved for concepts with insufficient information to code with codable children: Secondary | ICD-10-CM

## 2012-03-09 DIAGNOSIS — M75102 Unspecified rotator cuff tear or rupture of left shoulder, not specified as traumatic: Secondary | ICD-10-CM

## 2012-03-09 DIAGNOSIS — Z7189 Other specified counseling: Secondary | ICD-10-CM | POA: Insufficient documentation

## 2012-03-09 DIAGNOSIS — N8111 Cystocele, midline: Secondary | ICD-10-CM

## 2012-03-09 DIAGNOSIS — E1165 Type 2 diabetes mellitus with hyperglycemia: Secondary | ICD-10-CM

## 2012-03-09 MED ORDER — GABAPENTIN 100 MG PO CAPS: 100.0000 mg | ORAL_CAPSULE | Freq: Three times a day (TID) | ORAL | Status: DC | PRN

## 2012-03-09 NOTE — Assessment & Plan Note (Signed)
Restart omeprazole.

## 2012-03-09 NOTE — Assessment & Plan Note (Signed)
Pt with frozen shoulder Refer to [facility] for US guided intracapsular injection. Worsened following prior subacromial injection Limitation in ROM in all 3 planes Will stop Naproxen

## 2012-03-09 NOTE — Assessment & Plan Note (Signed)
Has not been taking meds due to confusion Med list updated and reviewed via interperter

## 2012-03-09 NOTE — Assessment & Plan Note (Addendum)
cbg check today Continue insulin anf home checks, does not have meter today Restart metformin

## 2012-03-09 NOTE — Assessment & Plan Note (Signed)
Restart neurontin

## 2012-03-11 NOTE — Progress Notes (Signed)
  [facility]  Patient name: Sandra Ryan MRN [id number]  Date of birth: 21-Nov-1955  CC & HPI:  Sandra Ryan is a 56 y.o. female presenting today for follow up of multiple complaints:  # DIABETES: diabetic diet compliance: probably noncompliant though I cannot elicit that specific history, home glucose monitoring: is performed sporadically, did not bring meter with her today.  Further diabetic ROS: no polyuria or polydipsia, no chest pain, dyspnea or TIA's, no numbness, tingling or pain in extremities, acute symptoms are none.  # Leg Pain:  Neuropathy.  Has not been taking medicines.  Still the same  # Epigastric Discomfort:  Worse with at night when lays down, no orthopnea, PND or overt chest pain.  Worse following spicy foods.  No dyspnea on exertion.  No vomiting.  Has not been taking PPI  # Shoulder Pain:  Reports L shoulder is still bothering her.  Not improved with prior sub acromial bursa injections.  Not performing exercises regularly or working on mobility.  Was taking Mobic but no effect   ------------------------------------------------------------------------------------------------------------------ Medication Compliance: noncompliant much of the time  Diet Compliance: probably noncompliant though I cannot elicit that specific history  ROS:  PER HPI  Pertinent History Reviewed:  Medical & Surgical Hx:  Reviewed: Significant for post menopausal bleeding now s/p Endometrial Bx X2 Medications: Reviewed & Updated - see associated section Social History: Reviewed -  reports that she has been smoking.  She has never used smokeless tobacco.  Objective Findings:  Vitals:  Filed Vitals:   03/09/12 1519  BP: 142/86  Pulse: 102  Temp: 99.2 F (37.3 C)    GENERAL:  Adult nepalese female. In no discomfort; no respiratory distress. PSYCH: Alert and appropriately interactive but appears frustrated; Insight:Fair and lack of medical literacy   H&N: AT/[place], trachea  midline EENT:  MMM, no scleral icterus, EOMi HEART: RRR, S1/S2 heard, no murmur LUNGS: CTA B, no wheezes, no crackles EXTREMITIES: Moves all 4 extremities spontaneously, warm well perfused, no edema, bilateral DP and PT pulses 2/4.  Restricted ROM in LUE.  Limited flexion, limited internal and external rotation, flexion, + hawkins, + neers, + speeds  Assessment & Plan:

## 2012-03-12 ENCOUNTER — Ambulatory Visit (INDEPENDENT_AMBULATORY_CARE_PROVIDER_SITE_OTHER): Payer: Medicaid Other | Admitting: Sports Medicine

## 2012-03-12 VITALS — BP 133/84 | Ht 61.0 in | Wt 123.0 lb

## 2012-03-12 DIAGNOSIS — M7500 Adhesive capsulitis of unspecified shoulder: Secondary | ICD-10-CM

## 2012-03-12 DIAGNOSIS — M7502 Adhesive capsulitis of left shoulder: Secondary | ICD-10-CM | POA: Insufficient documentation

## 2012-03-12 NOTE — Progress Notes (Signed)
Sandra Ryan is a 56 y.o. female who presents to [facility] today for left shoulder pain.  Pain is present her left shoulder for around 6 months.  She denies any injury.  She describes her shoulder pain is achy. Additionally she notes restrictions in range of motion.  She notes pain is worse with activity and at night when laying on her side.  She notes the pain is in the lateral aspect of her shoulder, but denies any radicular symptoms weakness or numbness.  She feels well otherwise. She has had trials of oral anti-inflammatory medicines which have not been effective.   PMH reviewed. Diabetes History  Substance Use Topics  . Smoking status: Current Some Day Smoker  . Smokeless tobacco: Never Used     Comment: occasional use  . Alcohol Use: No   ROS as above otherwise neg   Exam:  BP 133/84  Ht 5\' 1"  (1.549 m)  Wt 123 lb (55.792 kg)  BMI 23.24 kg/m2 Gen: Well NAD MSK: Left shoulder.  Normal-appearing Range of motion: Severely limited external rotation to 5 beyond neutral and significantly limited abduction and forward flexion to about 80. Internal rotation is to the iliac crest. Strength 4+/5 to supraspinatus external and internal rotation. Unable to do impingement testing secondary to limited range of motion. Distal sensation and strength are intact Pulses and capillary refill intact distal Neck exam: Normal range of motion negative Spurling's test  Musculoskeletal ultrasound of the left shoulder:  Biceps tendon is normal-appearing Subscapularis tendon shows slight hypoechoic changes without tear Supraspinatus tendon shows a hypoechoic changes without tear.  Thickened joint capsule seen Infraspinatus shows no tearing.  A.c. joint normal-appearing:   Procedure note left glenohumeral injection ultrasound guidance. Consent obtained and timeout performed. Posterior glenohumeral joint located with ultrasound.  The skin was then cleaned with alcohol and the probe was sterilized and  covered with Tegaderm.  Sterile ultrasound gel was used to again identified the posterior glenohumeral joint.  A 22-gauge 1.5 inch needle was used to access the posterior glenohumeral joint.    40 mg of Depo-Medrol and 5 mL 05% Marcaine without epinephrine were injected into the glenohumeral joint under ultrasound guidance. The capsule was seen to distend.  Patient tolerated the procedure well with no numbness or bleeding.  No results found.

## 2012-03-12 NOTE — Assessment & Plan Note (Signed)
Seen on exam today with an intact appearing rotator cuff.  Glenohumeral injection under ultrasound guidance.  Home exercise program focusing on passive range of motion.  Followup in 6 weeks

## 2012-03-12 NOTE — Patient Instructions (Addendum)
Thank you for coming in today. Come back in 4 weeks.  Call or go to the ER if you develop a large red swollen joint with extreme pain or oozing puss.  Do the exercises.

## 2012-03-19 ENCOUNTER — Other Ambulatory Visit (INDEPENDENT_AMBULATORY_CARE_PROVIDER_SITE_OTHER): Payer: Medicaid Other

## 2012-03-19 ENCOUNTER — Ambulatory Visit (INDEPENDENT_AMBULATORY_CARE_PROVIDER_SITE_OTHER): Payer: Medicaid Other | Admitting: Gastroenterology

## 2012-03-19 ENCOUNTER — Encounter: Payer: Self-pay | Admitting: Gastroenterology

## 2012-03-19 VITALS — BP 134/80 | HR 72 | Ht 59.0 in | Wt 121.4 lb

## 2012-03-19 DIAGNOSIS — K625 Hemorrhage of anus and rectum: Secondary | ICD-10-CM

## 2012-03-19 LAB — CBC WITH DIFFERENTIAL/PLATELET
Basophils Absolute: 0 10*3/uL (ref 0.0–0.1)
Basophils Relative: 0.4 % (ref 0.0–3.0)
Eosinophils Absolute: 0.2 10*3/uL (ref 0.0–0.7)
Hemoglobin: 12.9 g/dL (ref 12.0–15.0)
MCHC: 33.2 g/dL (ref 30.0–36.0)
MCV: 89.1 fl (ref 78.0–100.0)
Monocytes Absolute: 0.5 10*3/uL (ref 0.1–1.0)
Neutro Abs: 5.9 10*3/uL (ref 1.4–7.7)
Neutrophils Relative %: 64.9 % (ref 43.0–77.0)
RBC: 4.37 Mil/uL (ref 3.87–5.11)
RDW: 12.8 % (ref 11.5–14.6)

## 2012-03-19 MED ORDER — PEG-KCL-NACL-NASULF-NA ASC-C 100 G PO SOLR: 1.0000 | Freq: Once | ORAL | Status: DC

## 2012-03-19 NOTE — Patient Instructions (Addendum)
You will be set up for a colonoscopy for rectal bleeding. Moderate sedation, LEC.  Interpreter will need to be present again. You will have labs checked today in the basement lab.  Please head down after you check out with the front desk  (cbc). You have been scheduled for a colonoscopy . Please follow written instructions given to you at your visit today.  Please pick up your prep kit at the pharmacy within the next 1-3 days. If you use inhalers (even only as needed) or a CPAP machine, please bring them with you on the day of your procedure.

## 2012-03-19 NOTE — Progress Notes (Signed)
HPI: This is a     very pleasant 56 year old woman whom I am meeting for the first time today. She was here with her daughter. There is also an interpreter here today with her.  [place] native.    Has been having bleeding. Vaginal, seen by gynecology for post menapausal bleeidng.    Also rectal bleeding, "anal" per patient.  1-2 times a month, only with BMs.  Chronic.  Blood is red only.   Constipation never occurs.   Never had colonoscopy,  No colon cancer in her family.   Review of systems: Pertinent positive and negative review of systems were noted in the above HPI section. Complete review of systems was performed and was otherwise normal.    Past Medical History  Diagnosis Date  . Sleep apnea   . GERD (gastroesophageal reflux disease)   . Cystocele   . Abnormal Pap smear   . Hemorrhoid   . Diabetes mellitus without complication     Past Surgical History  Procedure Date  . No signficant history     Current Outpatient Prescriptions  Medication Sig Dispense Refill  . aspirin 81 MG EC tablet Take 1 tablet (81 mg total) by mouth daily. Swallow whole.  30 tablet  12  . Blood Glucose Monitoring Suppl (ACCU-CHEK AVIVA PLUS) W/DEVICE KIT 1 Device by Does not apply route 3 (three) times daily before meals.  1 kit  0  . Blood Glucose Monitoring Suppl (ACCU-CHEK NANO SMARTVIEW) W/DEVICE KIT 1 kit by Does not apply route once.  1 kit  0  . gabapentin (NEURONTIN) 100 MG capsule Take 1 capsule (100 mg total) by mouth 3 (three) times daily as needed. Take everynight  90 capsule  3  . glucose blood (ACCU-CHEK SMARTVIEW) test strip Sufficient for twice daily testing  100 each  12  . insulin glargine (LANTUS) 100 UNIT/ML injection Inject 20 Units into the skin every morning.  10 mL  11  . Insulin Pen Needle 31G X 5 MM MISC Inject Insulin qam using insulin pen  250 each  3  . Lancets 30G MISC 1 Device by Does not apply route 4 (four) times daily - after meals and at bedtime.  100 each  11    . lisinopril (PRINIVIL,ZESTRIL) 5 MG tablet Take 1 tablet (5 mg total) by mouth daily.  90 tablet  3  . metFORMIN (GLUCOPHAGE) 1000 MG tablet Take 1 tablet (1,000 mg total) by mouth 2 (two) times daily with a meal.  90 tablet  11  . omeprazole (PRILOSEC) 20 MG capsule Take 2 capsules (40 mg total) by mouth daily.  30 capsule  3    Allergies as of 03/19/2012  . (No Known Allergies)    Family History  Problem Relation Age of Onset  . Asthma Father   . Heart disease Father     History   Social History  . Marital Status: Married    Spouse Name: N/A    Number of Children: 6  . Years of Education: N/A   Occupational History  . Retired    Social History Main Topics  . Smoking status: Former Games developer  . Smokeless tobacco: Never Used     Comment: occasional use  . Alcohol Use: No  . Drug Use: No  . Sexually Active: No   Other Topics Concern  . Not on file   Social History Narrative  . No narrative on file       Physical Exam: BP 134/80  Pulse 72  Ht 4\' 11"  (1.499 m)  Wt 121 lb 6.4 oz (55.067 kg)  BMI 24.52 kg/m2 Constitutional: generally well-appearing Psychiatric: alert and oriented x3 Eyes: extraocular movements intact Mouth: oral pharynx moist, no lesions Neck: supple no lymphadenopathy Cardiovascular: heart regular rate and rhythm Lungs: clear to auscultation bilaterally Abdomen: soft, nontender, nondistended, no obvious ascites, no peritoneal signs, normal bowel sounds Extremities: no lower extremity edema bilaterally Skin: no lesions on visible extremities Rectal exam deferred for upcoming colonoscopy   Assessment and plan: 56 y.o. female with  minor rectal bleeding  She will have labs today to see if she is anemic, CBC. I will proceed with colonoscopy at her soonest convenience for her minor rectal bleeding.

## 2012-03-26 ENCOUNTER — Ambulatory Visit: Payer: Medicaid Other | Admitting: *Deleted

## 2012-04-02 ENCOUNTER — Encounter: Payer: Medicaid Other | Attending: Sports Medicine | Admitting: *Deleted

## 2012-04-02 VITALS — Ht 61.0 in | Wt 118.0 lb

## 2012-04-02 DIAGNOSIS — IMO0001 Reserved for inherently not codable concepts without codable children: Secondary | ICD-10-CM | POA: Insufficient documentation

## 2012-04-02 DIAGNOSIS — E1165 Type 2 diabetes mellitus with hyperglycemia: Secondary | ICD-10-CM

## 2012-04-02 DIAGNOSIS — Z713 Dietary counseling and surveillance: Secondary | ICD-10-CM | POA: Insufficient documentation

## 2012-04-02 NOTE — Progress Notes (Signed)
  Medical Nutrition Therapy:  Appt start time: 1630 end time:  1800.  Assessment:  Primary concerns today: patient here with her grown daughter and interpetor for diabetes education. She states she SMBG twice a day with a BG range of 69-208. She showed me her injection sites which tend to be in the same area of her abdomen on both sides of her stomach. Diet history obtained which has little variation in types of foods. Weight appears to be stable. She does not like plain water so she has been adding salt to her glass to flavor it. She also complains of dry mouth, but no thirst.  MEDICATIONS: see list. Diabetes medications are 20 units Lantus in evening and 500 mg Metformin twice a day with food   DIETARY INTAKE:  Usual eating pattern includes 3 meals and 0 snacks per day.  Everyday foods include rice, bread, lean meats.  Avoided foods include sweets, grapes, banana, apple oranges now - dry mouth.    24-hr recall:  B ( AM): bread OR rice occasionally with protein, salt water or red tea with salt Snk ( AM): none  L ( PM): bread, rice, vegetables, salt water to drink Snk ( PM): none D ( PM): bread, rice, but doesn't feel like eating very often Snk ( PM): none Beverages: salt water as she doesn't like plain water  Usual physical activity: walks twice every day  Estimated energy needs: 1400 calories 158 g carbohydrates 105 g protein 39 g fat  Progress Towards Goal(s):  In progress.   Nutritional Diagnosis:  NB-1.1 Food and nutrition-related knowledge deficit As related to management of diabetes.  As evidenced by A1c of 13.3% on 01/29/2012.    Intervention:  Nutrition counseling and diabetes education initiated with assistance of interpretor and the daughter. Discussed basic physiology of diabetes, SMBG, A1c, rotation of insulin injection sites, Carb Counting using food models to communicate carb content of various food groups that she eats. Daughter states she could read some English so  handouts provided in [place]. Priority of visit was to educate on carb content of her typical foods and suggest appropriate serving sizes to help improve BG management. Also encouraged continued walking as able. Plan:  Aim for 3 Carb Choices per meal (45 grams) +/- 1 either way  Aim for 0-1 Carbs per snack if hungry  Consider reading food labels for Total Carbohydrate of foods Consider checking BG twice a day at alternate times per day as directed by MD  Consider rotating your insulin injection sites as we discussed per MD instructions  Handouts given during visit include: Living Well with Diabetes Carb Counting and Food Label handouts Meal Plan Card Insulin Injection Site handout  Monitoring/Evaluation:  Dietary intake, exercise, rotate insulin sites, and body weight prn. Patient or family to make follow up appointment after next A1c is obtained as needed.

## 2012-04-07 ENCOUNTER — Ambulatory Visit (INDEPENDENT_AMBULATORY_CARE_PROVIDER_SITE_OTHER): Payer: Medicaid Other | Admitting: Sports Medicine

## 2012-04-07 ENCOUNTER — Encounter: Payer: Self-pay | Admitting: Sports Medicine

## 2012-04-07 VITALS — BP 114/81 | HR 103 | Temp 99.1°F | Wt 117.9 lb

## 2012-04-07 DIAGNOSIS — E1140 Type 2 diabetes mellitus with diabetic neuropathy, unspecified: Secondary | ICD-10-CM

## 2012-04-07 DIAGNOSIS — IMO0001 Reserved for inherently not codable concepts without codable children: Secondary | ICD-10-CM

## 2012-04-07 DIAGNOSIS — E1165 Type 2 diabetes mellitus with hyperglycemia: Secondary | ICD-10-CM

## 2012-04-07 DIAGNOSIS — Z0000 Encounter for general adult medical examination without abnormal findings: Secondary | ICD-10-CM

## 2012-04-07 DIAGNOSIS — E1149 Type 2 diabetes mellitus with other diabetic neurological complication: Secondary | ICD-10-CM

## 2012-04-07 DIAGNOSIS — M7502 Adhesive capsulitis of left shoulder: Secondary | ICD-10-CM

## 2012-04-07 DIAGNOSIS — E1142 Type 2 diabetes mellitus with diabetic polyneuropathy: Secondary | ICD-10-CM

## 2012-04-07 DIAGNOSIS — M7500 Adhesive capsulitis of unspecified shoulder: Secondary | ICD-10-CM

## 2012-04-09 MED ORDER — METFORMIN HCL 1000 MG PO TABS: 500.0000 mg | ORAL_TABLET | Freq: Two times a day (BID) | ORAL | Status: DC

## 2012-04-09 NOTE — Assessment & Plan Note (Signed)
Improving.  Appreciate evaluation by [facility]

## 2012-04-09 NOTE — Assessment & Plan Note (Signed)
Improved on neurontin.  Continue dose >consider titrating upwards if symptoms worsen.

## 2012-04-09 NOTE — Assessment & Plan Note (Signed)
Will decrease metformin to 500 bid given GI upset Sugars appropriate. Will recheck a1c in 6 weeks

## 2012-04-09 NOTE — Progress Notes (Signed)
  [facility]  Patient name: Sandra Ryan MRN [id number]  Date of birth: Jul 27, 1955  CC & HPI:  Sandra Ryan is a 57 y.o. female presenting today for follow up of:  #DIABETES: , home glucose monitoring: is performed sporadically, fasting and non-fasting values range 100s-200, further diabetic ROS: no polyuria or polydipsia, no chest pain, dyspnea or TIA's, no numbness, tingling or pain in extremities, acute symptoms are none.  Has seen diabetes educators.  Feels she is doing much better.    Having some abdominal discomfort since starting metformin.  Abdominal cramping.  No diarrhea or constipation.  No hematachezia/melana  # Neuropathy is doing better on neurontin.  Less pain especially at night.  Burning sensation improved.  # Coordination of care: Shoulder is doing better following steroid injection in SM.  Scheduled for Colonoscopy.  Has not had eye exam yet  ------------------------------------------------------------------------------------------------------------------ Medication Compliance: compliant all of the time  Diet Compliance: compliant all of the time  ------------------------------------------------------------------------------------------------------------------ New Concerns:  None   ROS:  PER HPI  Pertinent History Reviewed:  Medical & Surgical Hx:  Reviewed: Significant for Rotator Cuff syndome.  DM2, GERD Medications: Reviewed & Updated - see associated section Social History: Reviewed -  reports that she has quit smoking. She has never used smokeless tobacco.   Objective Findings:  Vitals:  Filed Vitals:   04/07/12 1524  BP: 114/81  Pulse: 103  Temp: 99.1 F (37.3 C)    GENERAL:  Adult nepalese female. In no discomfort; no respiratory distress. PSYCH: Alert and appropriately interactive but appears frustrated; Insight:Fair and lack of medical literacy   H&N: AT/[place], trachea midline EENT:  MMM, no scleral icterus, EOMi HEART: RRR, S1/S2 heard,  no murmur LUNGS: CTA B, no wheezes, no crackles EXTREMITIES: Moves all 4 extremities spontaneously, warm well perfused, no edema, bilateral DP and PT pulses 2/4.  Assessment & Plan:

## 2012-04-09 NOTE — Assessment & Plan Note (Addendum)
Scheduled for colonoscopy Still needs to have eye exam, plans to do so On ACEi due to microalbumin, BP appropriate

## 2012-04-11 ENCOUNTER — Encounter: Payer: Self-pay | Admitting: *Deleted

## 2012-04-11 NOTE — Patient Instructions (Signed)
Plan:  Aim for 3 Carb Choices per meal (45 grams) +/- 1 either way  Aim for 0-1 Carbs per snack if hungry  Consider reading food labels for Total Carbohydrate of foods Consider checking BG twice a day at alternate times per day as directed by MD  Consider rotating your insulin injection sites as we discussed per MD instructions

## 2012-04-21 ENCOUNTER — Ambulatory Visit (AMBULATORY_SURGERY_CENTER): Payer: Medicaid Other | Admitting: Gastroenterology

## 2012-04-21 ENCOUNTER — Encounter: Payer: Self-pay | Admitting: Gastroenterology

## 2012-04-21 VITALS — BP 107/75 | HR 84 | Temp 98.0°F | Resp 13 | Ht 59.0 in | Wt 121.0 lb

## 2012-04-21 DIAGNOSIS — D126 Benign neoplasm of colon, unspecified: Secondary | ICD-10-CM

## 2012-04-21 DIAGNOSIS — K648 Other hemorrhoids: Secondary | ICD-10-CM

## 2012-04-21 DIAGNOSIS — K625 Hemorrhage of anus and rectum: Secondary | ICD-10-CM

## 2012-04-21 LAB — GLUCOSE, CAPILLARY: Glucose-Capillary: 140 mg/dL — ABNORMAL HIGH (ref 70–99)

## 2012-04-21 MED ORDER — SODIUM CHLORIDE 0.9 % IV SOLN: 500.0000 mL | INTRAVENOUS | Status: DC

## 2012-04-21 NOTE — Op Note (Signed)
[facility] [address] [place], [address]   COLONOSCOPY PROCEDURE REPORT  PATIENT: Sandra Ryan, Sandra Ryan  MR#: [id number] BIRTHDATE: 23-Nov-1955 , 57  yrs. old GENDER: Female ENDOSCOPIST: Rachael Fee, MD REFERRED GE:[id number] Berline Chough, MD PROCEDURE DATE:  04/21/2012 PROCEDURE:   Colonoscopy with snare polypectomy ASA CLASS:   Class II INDICATIONS:minor rectal bleeding. MEDICATIONS: Fentanyl 50 mcg IV, Versed 4 mg IV, and These medications were titrated to patient response per physician's verbal order  DESCRIPTION OF PROCEDURE:   After the risks benefits and alternatives of the procedure were thoroughly explained, informed consent was obtained.  A digital rectal exam revealed no abnormalities of the rectum.   The LB [id number]  endoscope was introduced through the anus and advanced to the cecum, which was identified by both the appendix and ileocecal valve. No adverse events experienced.   The quality of the prep was good, using MoviPrep  The instrument was then slowly withdrawn as the colon was fully examined.  COLON FINDINGS: There were two small polyps, both were removed, one was retrieved and sent to pathology.  These were sessile, located in ascending colon, 2-77mm across, removed with cold snare.  There were small internal hemorrhoids.  The examination was otherwise normal.  Retroflexed views revealed no abnormalities. The time to cecum=2 minutes 24 seconds.  Withdrawal time=8 minutes 40 seconds. The scope was withdrawn and the procedure completed. COMPLICATIONS: There were no complications.  ENDOSCOPIC IMPRESSION: There were two small polyps, both were removed, one was retrieved and sent to pathology. There were small internal hemorrhoids. The examination was otherwise normal.  RECOMMENDATIONS: If the polyp(s) removed today are proven to be adenomatous (pre-cancerous) polyps, you will need a repeat colonoscopy in 5 years.  Otherwise you should  continue to follow colorectal cancer screening guidelines for "routine risk" patients with colonoscopy in 10 years.  You will receive a letter within 1-2 weeks with the results of your biopsy as well as final recommendations.  Please call my office if you have not received a letter after 3 weeks.   eSigned:  Rachael Fee, MD 04/21/2012 9:26 AM

## 2012-04-21 NOTE — Progress Notes (Signed)
Patient did not experience any of the following events: a burn prior to discharge; a fall within the facility; wrong site/side/patient/procedure/implant event; or a hospital transfer or hospital admission upon discharge from the facility. [phone]) Patient did not have preoperative order for IV antibiotic SSI prophylaxis. [phone])  ewm  Interpreter and family at bedside to help with translation. ewm

## 2012-04-21 NOTE — Progress Notes (Signed)
Pt has an interpreter, Despina Arias, from the language service.  He participated in the time out with the pt.  Once the pt's questions were asswered, the interpreter was escorted back to the waiting area. Maw  The pt tolerated the colonoscopy very well. Maw

## 2012-04-21 NOTE — Patient Instructions (Addendum)
YOU HAD AN ENDOSCOPIC PROCEDURE TODAY AT THE [facility]: Refer to the procedure report that was given to you for any specific questions about what was found during the examination.  If the procedure report does not answer your questions, please call your gastroenterologist to clarify.  If you requested that your care partner not be given the details of your procedure findings, then the procedure report has been included in a sealed envelope for you to review at your convenience later.  YOU SHOULD EXPECT: Some feelings of bloating in the abdomen. Passage of more gas than usual.  Walking can help get rid of the air that was put into your GI tract during the procedure and reduce the bloating. If you had a lower endoscopy (such as a colonoscopy or flexible sigmoidoscopy) you may notice spotting of blood in your stool or on the toilet paper. If you underwent a bowel prep for your procedure, then you may not have a normal bowel movement for a few days.  DIET: Your first meal following the procedure should be a light meal and then it is ok to progress to your normal diet.  A half-sandwich or bowl of soup is an example of a good first meal.  Heavy or fried foods are harder to digest and may make you feel nauseous or bloated.  Likewise meals heavy in dairy and vegetables can cause extra gas to form and this can also increase the bloating.  Drink plenty of fluids but you should avoid alcoholic beverages for 24 hours.  ACTIVITY: Your care partner should take you home directly after the procedure.  You should plan to take it easy, moving slowly for the rest of the day.  You can resume normal activity the day after the procedure however you should NOT DRIVE or use heavy machinery for 24 hours (because of the sedation medicines used during the test).    SYMPTOMS TO REPORT IMMEDIATELY: A gastroenterologist can be reached at any hour.  During normal business hours, 8:30 AM to 5:00 PM Monday through Friday,  call [phone].  After hours and on weekends, please call the GI answering service at [phone]  Emergency number who will take a message and have the physician on call contact you.   Following lower endoscopy (colonoscopy or flexible sigmoidoscopy):  Excessive amounts of blood in the stool  Significant tenderness or worsening of abdominal pains  Swelling of the abdomen that is new, acute  Fever of 100F or higher FFOLLOW UP: If any biopsies were taken you will be contacted by phone or by letter within the next 1-3 weeks.  Call your gastroenterologist if you have not heard about the biopsies in 3 weeks.  Our staff will call the home number listed on your records the next business day following your procedure to check on you and address any questions or concerns that you may have at that time regarding the information given to you following your procedure. This is a courtesy call and so if there is no answer at the home number and we have not heard from you through the emergency physician on call, we will assume that you have returned to your regular daily activities without incident.  SIGNATURES/CONFIDENTIALITY: You and/or your care partner have signed paperwork which will be entered into your electronic medical record.  These signatures attest to the fact that that the information above on your After Visit Summary has been reviewed and is understood.  Full responsibility of the confidentiality  of this discharge information lies with you and/or your care-partner.   Handout on polyps and hemorrhoids

## 2012-04-22 ENCOUNTER — Telehealth: Payer: Self-pay | Admitting: *Deleted

## 2012-04-22 ENCOUNTER — Ambulatory Visit (INDEPENDENT_AMBULATORY_CARE_PROVIDER_SITE_OTHER): Payer: Medicaid Other | Admitting: Sports Medicine

## 2012-04-22 ENCOUNTER — Other Ambulatory Visit: Payer: Medicaid Other | Admitting: Sports Medicine

## 2012-04-22 VITALS — BP 110/60 | Ht 63.0 in | Wt 145.0 lb

## 2012-04-22 DIAGNOSIS — M25519 Pain in unspecified shoulder: Secondary | ICD-10-CM

## 2012-04-22 DIAGNOSIS — M25512 Pain in left shoulder: Secondary | ICD-10-CM

## 2012-04-22 DIAGNOSIS — M7500 Adhesive capsulitis of unspecified shoulder: Secondary | ICD-10-CM

## 2012-04-22 DIAGNOSIS — M7502 Adhesive capsulitis of left shoulder: Secondary | ICD-10-CM

## 2012-04-22 MED ORDER — MELOXICAM 7.5 MG PO TABS: 7.5000 mg | ORAL_TABLET | Freq: Every day | ORAL | Status: DC

## 2012-04-22 NOTE — Addendum Note (Signed)
Addended by: Jacki Cones C on: 04/22/2012 02:21 PM   Modules accepted: Orders

## 2012-04-22 NOTE — Telephone Encounter (Signed)
  Follow up Call-  Call back number 04/21/2012  Post procedure Call Back phone  # [phone]  Permission to leave phone message Yes     Patient questions:  Do you have a fever, pain , or abdominal swelling? no Pain Score  0 *  Have you tolerated food without any problems? yes  Have you been able to return to your normal activities? yes  Do you have any questions about your discharge instructions: Diet   no Medications  no Follow up visit  no  Do you have questions or concerns about your Care? no  Actions: * If pain score is 4 or above: No action needed, pain <4.  Spoke with patient's son.

## 2012-04-22 NOTE — Assessment & Plan Note (Signed)
Overall actually patient has made improvement with range of motion compared to previous visit. Patient given a one-week dose of meloxicam. Patient will start formal physical therapy for range of motion exercises over the course of the next 8 weeks. Patient will followup in 8 weeks' time and if she continues to have trouble we will consider doing another intra-articular joint injection. All questions were answered with the aid of an interpreter today.

## 2012-04-22 NOTE — Progress Notes (Signed)
Chief complaint: Left shoulder pain  History of present illness: This is a patient is following up for left shoulder pain. Patient was seen previously one month ago and was diagnosed with adhesive capsulitis. Patient states she had a crit of steroid injection at last visit and this did give her relief for approximately 2 weeks. Patient then started the exercises and had worsening pain so she stopped. Patient states since then her arm has started to have decreased range of motion again. Patient states that she has still significant pain at night but she denies any weakness more just cannot move it on a regular basis. Patient has not tried any prescriptive anti-inflammatories but have been over-the-counter anti-inflammatories previously.  Physical exam Blood pressure 110/60, height 5\' 3"  (1.6 m), weight 145 lb (65.772 kg). General: No apparent distress alert and oriented x3 mood and affect normal. Patient is accompanied with an interpreter today. Respiratory: Patient's speak in full sentences and does not appear short of breath Skin: Warm dry intact with no signs of infection or rash Neuro: Cranial nerves II through XII are intact, neurovascularly intact in all extremities with 2+ DTRs and 2+ pulses. MSK: Left shoulder.  Normal-appearing  Range of motion: limited external rotation to 15 beyond neutral which is improved from 5 degrees. significantly limited abduction and forward flexion to about 120.  Internal rotation is to the iliac crest.  Strength 4+/5 to supraspinatus external and internal rotation.  Impingement test negative Distal sensation and strength are intact  Pulses and capillary refill intact distal  Neck exam: Normal range of motion negative Spurling's test

## 2012-04-22 NOTE — Patient Instructions (Addendum)
Very good to see you. I am going to get to him to physical therapy. I when she to go 2 times a week for the next 8 weeks. He will help you with your range of motion of her left shoulder. I'm also giving you a medicine I when she to take daily for the next week. This will help with some of the pain. I when she to come back in 2 months and at that time we will re\re ultrasound you as well as do a another injection if needed.

## 2012-04-28 ENCOUNTER — Encounter: Payer: Self-pay | Admitting: Gastroenterology

## 2012-05-06 ENCOUNTER — Ambulatory Visit: Payer: Medicaid Other | Attending: Sports Medicine

## 2012-05-06 DIAGNOSIS — R293 Abnormal posture: Secondary | ICD-10-CM | POA: Insufficient documentation

## 2012-05-06 DIAGNOSIS — M25619 Stiffness of unspecified shoulder, not elsewhere classified: Secondary | ICD-10-CM | POA: Insufficient documentation

## 2012-05-06 DIAGNOSIS — M25519 Pain in unspecified shoulder: Secondary | ICD-10-CM | POA: Insufficient documentation

## 2012-05-06 DIAGNOSIS — IMO0001 Reserved for inherently not codable concepts without codable children: Secondary | ICD-10-CM | POA: Insufficient documentation

## 2012-05-14 ENCOUNTER — Ambulatory Visit: Payer: Medicaid Other | Admitting: Sports Medicine

## 2012-05-19 ENCOUNTER — Encounter: Payer: Self-pay | Admitting: Sports Medicine

## 2012-05-19 ENCOUNTER — Ambulatory Visit (INDEPENDENT_AMBULATORY_CARE_PROVIDER_SITE_OTHER): Payer: Medicaid Other | Admitting: Sports Medicine

## 2012-05-19 VITALS — BP 132/78 | HR 87 | Temp 99.4°F | Ht 61.0 in | Wt 115.0 lb

## 2012-05-19 DIAGNOSIS — Z0000 Encounter for general adult medical examination without abnormal findings: Secondary | ICD-10-CM

## 2012-05-19 DIAGNOSIS — E1165 Type 2 diabetes mellitus with hyperglycemia: Secondary | ICD-10-CM

## 2012-05-19 DIAGNOSIS — E1140 Type 2 diabetes mellitus with diabetic neuropathy, unspecified: Secondary | ICD-10-CM

## 2012-05-19 DIAGNOSIS — M7502 Adhesive capsulitis of left shoulder: Secondary | ICD-10-CM

## 2012-05-19 MED ORDER — GABAPENTIN 300 MG PO CAPS: 300.0000 mg | ORAL_CAPSULE | Freq: Three times a day (TID) | ORAL | Status: DC | PRN

## 2012-05-19 MED ORDER — ASPIRIN 81 MG PO TBEC: 81.0000 mg | DELAYED_RELEASE_TABLET | Freq: Every day | ORAL | Status: DC

## 2012-05-19 NOTE — Assessment & Plan Note (Signed)
PAP, Colonoscopy, UpToDate Mammogram needed, information given

## 2012-05-19 NOTE — Assessment & Plan Note (Addendum)
Increased Neurontin to 300 tid Foot exam performed today >monitor for Side effects

## 2012-05-19 NOTE — Progress Notes (Signed)
  [facility]  Patient name: Sandra Ryan MRN [id number]  Date of birth: [birth year]  CC & HPI:  Sandra Ryan is a 57 y.o. female presenting today for follow up of:  #  Hypertension - chronic problem, well controlled.    no orthostasis  no chest pain, no dyspnea on exertion, no orthopnea/PND, no peripheral edema,   no episodes of unilateral weakness, dysarthria or acute visual changes  #  Diabetes - chronic problem, well controlled.   Blood Sugar checks/ranges: 60s to 160s normally 2 320s.  mild hypoglycemic symptoms/episodes. But able to correct well  no poluria, no polydipsia,  no new visual problems,  LE dysesthesias: Mild - improved on gabapentin  self foot checks performed: Weekly  Not on an aspirin  #  Hyperlipidemia - chronic problem,  Unclear control  Reports no RUQ pain, no Muscle Aches    ------------------------------------------------------------------------------------------------------------------ Medication Compliance: compliant all of the time  Diet Compliance: compliant all of the time ------------------------------------------------------------------------------------------------------------------ New Concerns:  None   ROS:  PER HPI  Pertinent History Reviewed:  Medical & Surgical Hx:  Reviewed: Significant for ASCUS, Cervical Polyp.  Normal Colonoscopy    Medications: Reviewed & Updated - See associated section in EMR Social History: Reviewed -  reports that she has quit smoking. She has never used smokeless tobacco.   Objective Findings:  Vitals: BP 132/78  Pulse 87  Temp(Src) 99.4 F (37.4 C) (Oral)  Ht 5\' 1"  (1.549 m)  Wt 115 lb (52.164 kg)  BMI 21.74 kg/m2  PE: GENERAL:  Adult nepaleses  female. In no discomfort; no respiratory distress.  Interpreter present for entire visit PSYCH: Alert and appropriately interactive; Insight:Good   H&N: AT/Bricelyn, trachea midline EENT:  MMM, no scleral icterus, EOMi HEART: RRR, S1/S2 heard, no  murmur LUNGS: CTA B, no wheezes, no crackles EXTREMITIES: Moves all 4 extremities spontaneously, warm well perfused, no edema, bilateral DP and PT pulses 2/4.      Assessment & Plan:

## 2012-05-19 NOTE — Assessment & Plan Note (Signed)
Significantly improved.  No changes Still not on ACEi, needs to be started at next visit but confusion over current medication regimen and will need slow changes to regimen. BP controlled today > A1c in 3 months

## 2012-05-19 NOTE — Patient Instructions (Signed)
It was nice to see you today.   Today we discussed: 1. Diabetes Your A1c today was 6.7!  That is excellent Keep working on not missing doses PLEASE START AN ASPIRIN - HgB A1c - aspirin 81 MG EC tablet; Take 1 tablet (81 mg total) by mouth daily. Swallow whole.  Dispense: 30 tablet; Refill: 12 - gabapentin (NEURONTIN) 300 MG capsule; Take 1 capsule (300 mg total) by mouth 3 (three) times daily as needed. Take everynight  Dispense: 90 capsule; Refill: 3  3. Diabetic neuropathy, type II diabetes mellitus I have increased your: - gabapentin (NEURONTIN) 300 MG capsule; Take 1 capsule (300 mg total) by mouth 3 (three) times daily as needed. Take everynight  Dispense: 90 capsule; Refill: 3  4. Health care maintenance Please get a mammogram   Please plan to return to see me in 3 months.  If you need anything prior to seeing me please call the clinic.  Please Bring all medications with you to each appointment.

## 2012-05-20 ENCOUNTER — Ambulatory Visit: Payer: Medicaid Other | Admitting: Physical Therapy

## 2012-05-21 ENCOUNTER — Other Ambulatory Visit: Payer: Self-pay

## 2012-05-24 NOTE — Assessment & Plan Note (Signed)
On ACEi for renal protection  Well controlled

## 2012-05-24 NOTE — Assessment & Plan Note (Signed)
Somewhat improved with injection. Has PT coming up.  F/u with [facility].

## 2012-05-27 ENCOUNTER — Ambulatory Visit: Payer: Medicaid Other | Attending: Sports Medicine | Admitting: Physical Therapy

## 2012-05-27 DIAGNOSIS — M25619 Stiffness of unspecified shoulder, not elsewhere classified: Secondary | ICD-10-CM | POA: Insufficient documentation

## 2012-05-27 DIAGNOSIS — R293 Abnormal posture: Secondary | ICD-10-CM | POA: Insufficient documentation

## 2012-05-27 DIAGNOSIS — M25519 Pain in unspecified shoulder: Secondary | ICD-10-CM | POA: Insufficient documentation

## 2012-05-27 DIAGNOSIS — IMO0001 Reserved for inherently not codable concepts without codable children: Secondary | ICD-10-CM | POA: Insufficient documentation

## 2012-06-09 ENCOUNTER — Ambulatory Visit: Payer: Medicaid Other | Admitting: Physical Therapy

## 2012-06-18 ENCOUNTER — Ambulatory Visit
Admission: RE | Admit: 2012-06-18 | Discharge: 2012-06-18 | Disposition: A | Discharge status: Home / Self Care | Payer: Medicaid Other | Source: Ambulatory Visit

## 2012-06-18 DIAGNOSIS — Z1231 Encounter for screening mammogram for malignant neoplasm of breast: Secondary | ICD-10-CM

## 2012-08-05 ENCOUNTER — Other Ambulatory Visit: Payer: Self-pay | Admitting: Sports Medicine

## 2012-09-17 ENCOUNTER — Other Ambulatory Visit: Payer: Self-pay | Admitting: *Deleted

## 2012-09-17 DIAGNOSIS — E1140 Type 2 diabetes mellitus with diabetic neuropathy, unspecified: Secondary | ICD-10-CM

## 2012-09-17 DIAGNOSIS — E1165 Type 2 diabetes mellitus with hyperglycemia: Secondary | ICD-10-CM

## 2012-09-17 MED ORDER — GABAPENTIN 300 MG PO CAPS: 300.0000 mg | ORAL_CAPSULE | Freq: Three times a day (TID) | ORAL | Status: DC | PRN

## 2012-10-30 ENCOUNTER — Ambulatory Visit (INDEPENDENT_AMBULATORY_CARE_PROVIDER_SITE_OTHER): Payer: Medicaid Other | Admitting: Sports Medicine

## 2012-10-30 ENCOUNTER — Encounter: Payer: Self-pay | Admitting: Sports Medicine

## 2012-10-30 VITALS — BP 101/69 | HR 81 | Temp 98.4°F | Wt 102.1 lb

## 2012-10-30 DIAGNOSIS — E1140 Type 2 diabetes mellitus with diabetic neuropathy, unspecified: Secondary | ICD-10-CM

## 2012-10-30 DIAGNOSIS — E1142 Type 2 diabetes mellitus with diabetic polyneuropathy: Secondary | ICD-10-CM

## 2012-10-30 DIAGNOSIS — E1159 Type 2 diabetes mellitus with other circulatory complications: Secondary | ICD-10-CM

## 2012-10-30 DIAGNOSIS — Z0000 Encounter for general adult medical examination without abnormal findings: Secondary | ICD-10-CM

## 2012-10-30 DIAGNOSIS — I10 Essential (primary) hypertension: Secondary | ICD-10-CM

## 2012-10-30 DIAGNOSIS — E1149 Type 2 diabetes mellitus with other diabetic neurological complication: Secondary | ICD-10-CM

## 2012-10-30 DIAGNOSIS — E1165 Type 2 diabetes mellitus with hyperglycemia: Secondary | ICD-10-CM

## 2012-10-30 DIAGNOSIS — E1169 Type 2 diabetes mellitus with other specified complication: Secondary | ICD-10-CM

## 2012-10-30 DIAGNOSIS — Z23 Encounter for immunization: Secondary | ICD-10-CM

## 2012-10-30 MED ORDER — LISINOPRIL 5 MG PO TABS: 2.5000 mg | ORAL_TABLET | Freq: Every day | ORAL | Status: DC

## 2012-10-30 MED ORDER — GLIPIZIDE 5 MG PO TABS: 5.0000 mg | ORAL_TABLET | Freq: Two times a day (BID) | ORAL | Status: DC

## 2012-10-30 MED ORDER — GABAPENTIN 100 MG PO CAPS: 100.0000 mg | ORAL_CAPSULE | Freq: Three times a day (TID) | ORAL | Status: DC

## 2012-10-30 MED ORDER — ATORVASTATIN CALCIUM 10 MG PO TABS: 10.0000 mg | ORAL_TABLET | Freq: Every day | ORAL | Status: DC

## 2012-10-30 NOTE — Assessment & Plan Note (Signed)
Lisinopril changed to 2.5 given ?orthostasis and great control

## 2012-10-30 NOTE — Assessment & Plan Note (Signed)
Neurontin changed to 100mg  tid, can use 300mg  tablets at night if not too symptomatic and given pain relief No lesions

## 2012-10-30 NOTE — Patient Instructions (Signed)
Referal to ophthalmology for an eye screening Stop Lantus - Start glipizide Neurontin three times daily Continue ASA

## 2012-10-30 NOTE — Progress Notes (Signed)
  [facility]  Patient name: Sandra Ryan MRN [id number]  Date of birth: 09-Jan-1956  CC & HPI:  Sandra Ryan is a 57 y.o. female presenting to clinic.  Chief Complaint  Patient presents with  . Medical Managment of Chronic Issues #  Hypertension - chronic problem, very well controlled.    Home BP checks/ranges:    Early morning orthostasis,   no peripheral edema  no chest pain, no dyspnea on exertion, no orthopnea/PND  no episodes of unilateral weakness, dysarthria or acute visual changes  #  Diabetes - chronic problem, very well controlled.   Blood Sugar checks/ranges: 120s-180s q am   no hypoglycemic symptoms/episodes.   no poluria, no polydipsia,  blurriness new visual problems,blurriness with reading  last eye exam: needs referall  LE dysesthesias: Yes - B gloove and stocking  self foot checks performed: Weekly  #  Neuropathy - chronic problem, marginally controlled.    Taking Neurontin only once at night    TLC Compliance Diet: compliant most of the time  Exercise: compliant most of the time  MEDS: compliant all of the time        ROS:  PER HPI  Pertinent History Reviewed:  Medical & Surgical Hx:  Reviewed: Significant for shoulder pain seen by [facility] Medications: Reviewed & Updated - see associated section Social History: Reviewed -  reports that she has quit smoking. She has never used smokeless tobacco.  Objective Findings:  Vitals: BP 101/69  Pulse 81  Temp(Src) 98.4 F (36.9 C) (Oral)  Wt 102 lb 1.6 oz (46.312 kg)  BMI 19.3 kg/m2 PE: GENERAL: Adult nepalese  female. In no discomfort; no respiratory distress  PSYCH:  alert and appropriate, good insight   HNEENT:    CARDIO:  RRR, S1/S2 heard, no murmur  LUNGS:  CTA B, no wheezes, no crackles  ABDOMEN:    EXTREM:  warm well perfused, No edema , No lesions   GU:   SKIN:   NEUROMSK:     Assessment & Plan:  See problem associated charting

## 2012-10-30 NOTE — Assessment & Plan Note (Signed)
Tetnus given today

## 2012-10-30 NOTE — Assessment & Plan Note (Addendum)
Refer to ophthalmology for DM screening and blurry vision A1c is 6.4 - STOP Lantus, start Glipizide F/u A1c in 3 months - stop home checking Start Statin

## 2012-11-11 ENCOUNTER — Encounter: Payer: Self-pay | Admitting: Sports Medicine

## 2012-11-11 DIAGNOSIS — E11319 Type 2 diabetes mellitus with unspecified diabetic retinopathy without macular edema: Secondary | ICD-10-CM | POA: Insufficient documentation

## 2013-03-05 ENCOUNTER — Encounter: Payer: Self-pay | Admitting: Sports Medicine

## 2013-03-05 ENCOUNTER — Ambulatory Visit (INDEPENDENT_AMBULATORY_CARE_PROVIDER_SITE_OTHER): Payer: Medicaid Other | Admitting: Sports Medicine

## 2013-03-05 VITALS — BP 113/76 | HR 93 | Temp 98.2°F | Ht 61.0 in | Wt 105.0 lb

## 2013-03-05 DIAGNOSIS — M25569 Pain in unspecified knee: Secondary | ICD-10-CM

## 2013-03-05 DIAGNOSIS — Z0000 Encounter for general adult medical examination without abnormal findings: Secondary | ICD-10-CM

## 2013-03-05 DIAGNOSIS — K219 Gastro-esophageal reflux disease without esophagitis: Secondary | ICD-10-CM

## 2013-03-05 DIAGNOSIS — E1169 Type 2 diabetes mellitus with other specified complication: Secondary | ICD-10-CM

## 2013-03-05 DIAGNOSIS — I10 Essential (primary) hypertension: Secondary | ICD-10-CM

## 2013-03-05 DIAGNOSIS — E1159 Type 2 diabetes mellitus with other circulatory complications: Secondary | ICD-10-CM

## 2013-03-05 DIAGNOSIS — E1165 Type 2 diabetes mellitus with hyperglycemia: Secondary | ICD-10-CM

## 2013-03-05 DIAGNOSIS — E1142 Type 2 diabetes mellitus with diabetic polyneuropathy: Secondary | ICD-10-CM

## 2013-03-05 DIAGNOSIS — M25561 Pain in right knee: Secondary | ICD-10-CM

## 2013-03-05 DIAGNOSIS — I152 Hypertension secondary to endocrine disorders: Secondary | ICD-10-CM

## 2013-03-05 DIAGNOSIS — E1149 Type 2 diabetes mellitus with other diabetic neurological complication: Secondary | ICD-10-CM

## 2013-03-05 DIAGNOSIS — IMO0002 Reserved for concepts with insufficient information to code with codable children: Secondary | ICD-10-CM

## 2013-03-05 DIAGNOSIS — E1140 Type 2 diabetes mellitus with diabetic neuropathy, unspecified: Secondary | ICD-10-CM

## 2013-03-05 LAB — POCT GLYCOSYLATED HEMOGLOBIN (HGB A1C): Hemoglobin A1C: 6.7

## 2013-03-05 MED ORDER — GLIPIZIDE 5 MG PO TABS: 5.0000 mg | ORAL_TABLET | Freq: Two times a day (BID) | ORAL | Status: DC

## 2013-03-05 MED ORDER — LISINOPRIL 2.5 MG PO TABS
2.5000 mg | ORAL_TABLET | Freq: Every day | ORAL | Status: DC
Start: 2013-03-05 — End: 2013-12-22

## 2013-03-05 MED ORDER — OMEPRAZOLE 40 MG PO CPDR: 40.0000 mg | DELAYED_RELEASE_CAPSULE | Freq: Every day | ORAL | Status: DC

## 2013-03-05 MED ORDER — ASPIRIN 81 MG PO TBEC: 81.0000 mg | DELAYED_RELEASE_TABLET | Freq: Every day | ORAL | Status: DC

## 2013-03-05 MED ORDER — GABAPENTIN 100 MG PO CAPS: 100.0000 mg | ORAL_CAPSULE | Freq: Three times a day (TID) | ORAL | Status: DC

## 2013-03-05 MED ORDER — METFORMIN HCL 1000 MG PO TABS: 1000.0000 mg | ORAL_TABLET | Freq: Two times a day (BID) | ORAL | Status: DC

## 2013-03-05 MED ORDER — ATORVASTATIN CALCIUM 40 MG PO TABS: 40.0000 mg | ORAL_TABLET | Freq: Every day | ORAL | Status: DC

## 2013-03-05 NOTE — Patient Instructions (Signed)
   Exercises as prescribed  No changes to diabetes meds    If you need anything prior to your next visit please call the clinic. Please Bring all medications or accurate medication list with you to each appointment; an accurate medication list is essential in providing you the best care possible.

## 2013-03-05 NOTE — Assessment & Plan Note (Signed)
Patient reports recurrence of reflux symptoms. Refill omeprazole for 3 months

## 2013-03-05 NOTE — Progress Notes (Signed)
  Sandra Ryan - 58 y.o. female MRN [id number]  Date of birth: Mar 29, 1955  CC, HPI, Interval History & ROS  Sandra Ryan is here today to followup on her chronic medical conditions including:  DM, HTN, Peripheral Neuropathy, GERD   She reports overall had been doing well before running out of medications 1 week ago.  Reports neurotin helped  Shoulder is better not doing exercises  Patient denies any facial asymmetry, unilateral weakness, or dysarthria.  Pt denies chest pain, dyspnea at rest or exertion, PND, lower extremity edema.  Pt denies hypoglycemic symptoms/episodes.  No reported polyuria/polydipsia. Pt does not perform self foot exams however denies any new foot lesions or new sensory changes/dysesthesias.  Was better on Neurontin previously  Other acute problems include:  R Knee pain - R Knee is bothering her, difficulty with straightening her knee after sitting for prolonged period. Heart Burn - 1-2 X per week; occurs after meals.  Was better on omeprazole.    Pertinent History & Care Coordination  Requires Nepali Interpreter   History  Smoking status  . Former Smoker  Smokeless tobacco  . Never Used    Comment: occasional use   No health maintenance topics applied.  Recent Labs  05/19/12 0300 10/30/12 0910 03/05/13 0941  HGBA1C 6.7 6.4 6.7     Otherwise past Medical, Surgical, Social, and Family History Reviewed per EMR Medications and Allergies reviewed and all updated if necessary. Objective Findings  VITALS: HR: 93 bpm  BP: 113/76 mmHg  TEMP: 98.2 F (36.8 C) (Oral)  RESP:    HT: 5\' 1"  (154.9 cm)  WT: 105 lb (47.628 kg)  BMI: 19.9   BP Readings from Last 3 Encounters:  03/05/13 113/76  10/30/12 101/69  05/19/12 132/78   Wt Readings from Last 3 Encounters:  03/05/13 105 lb (47.628 kg)  10/30/12 102 lb 1.6 oz (46.312 kg)  05/19/12 115 lb (52.164 kg)     PHYSICAL EXAM: GENERAL: Adult [place]  female. In no discomfort; no respiratory distress  PSYCH:  alert and appropriate, good insight   HNEENT:   CARDIO: RRR, S1/S2 heard, no murmur  LUNGS: CTA B, no wheezes, no crackles  ABDOMEN:   EXTREM:  Warm, well perfused.  Moves all 4 extremities spontaneously; no lateralization.  No noted foot lesions; sensation is intact to monofilament testing throughout both feet..  Distal pulses 2+/4.  No pretibial edema.  GU:   SKIN:    right knee Exam: Appear:  thin, good alignment no obvious defect.    Palp:  significant crepitation with patellar grind   ROM:  some loss of terminal extension and flexion   NV:   sensation grossly intact but diffusely weak   Testing:  antalgic gait.      Assessment & Plan   Problems addressed today: General Plan & Pt Instructions:  1. Diabetes mellitus type II, uncontrolled   2. Health care maintenance   3. Diabetic neuropathy, type II diabetes mellitus   4. GERD (gastroesophageal reflux disease)   5. High blood pressure associated with diabetes   6. Right knee pain       Exercises as prescribed  No changes to diabetes meds     For further discussion of A/P and for follow up issues see problem based charting.

## 2013-03-05 NOTE — Assessment & Plan Note (Addendum)
Reportedly up to date Titrated Lipitor to 40 mg > Follow up fasting labs at next visit.  Requested to come in fasting in 3 months.

## 2013-03-05 NOTE — Assessment & Plan Note (Addendum)
Remains to be moderately well controlled on oral regimen only.  Increased metformin to 1000mg  bid; glipizide 5mg  bid No evidence of further complication.

## 2013-03-05 NOTE — Assessment & Plan Note (Signed)
Blood pressure well controlled in spite of being off medications.  Small dose ACE inhibitor for renal protection given microalbumin

## 2013-03-05 NOTE — Assessment & Plan Note (Signed)
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  Encouraged exercise and ice. If not improved consider x-ray if followed by consideration of CSI

## 2013-03-05 NOTE — Assessment & Plan Note (Signed)
Refill Neurontin.

## 2013-03-08 ENCOUNTER — Other Ambulatory Visit: Payer: Self-pay | Admitting: Sports Medicine

## 2013-03-08 DIAGNOSIS — E1165 Type 2 diabetes mellitus with hyperglycemia: Secondary | ICD-10-CM

## 2013-03-08 MED ORDER — METFORMIN HCL 1000 MG PO TABS: 1000.0000 mg | ORAL_TABLET | Freq: Two times a day (BID) | ORAL | Status: DC

## 2013-03-15 ENCOUNTER — Other Ambulatory Visit: Payer: Self-pay | Admitting: Sports Medicine

## 2013-03-15 DIAGNOSIS — E1165 Type 2 diabetes mellitus with hyperglycemia: Secondary | ICD-10-CM

## 2013-03-15 DIAGNOSIS — IMO0002 Reserved for concepts with insufficient information to code with codable children: Secondary | ICD-10-CM

## 2013-03-15 MED ORDER — METFORMIN HCL 1000 MG PO TABS: 1000.0000 mg | ORAL_TABLET | Freq: Two times a day (BID) | ORAL | Status: DC

## 2013-08-26 ENCOUNTER — Ambulatory Visit: Payer: Self-pay | Admitting: Sports Medicine

## 2013-12-22 ENCOUNTER — Encounter: Payer: Self-pay | Admitting: Family Medicine

## 2013-12-22 ENCOUNTER — Ambulatory Visit (INDEPENDENT_AMBULATORY_CARE_PROVIDER_SITE_OTHER): Payer: Medicaid Other | Admitting: Family Medicine

## 2013-12-22 VITALS — BP 135/101 | HR 71 | Temp 98.4°F | Wt 111.4 lb

## 2013-12-22 DIAGNOSIS — M7502 Adhesive capsulitis of left shoulder: Secondary | ICD-10-CM

## 2013-12-22 DIAGNOSIS — G609 Hereditary and idiopathic neuropathy, unspecified: Secondary | ICD-10-CM

## 2013-12-22 DIAGNOSIS — G629 Polyneuropathy, unspecified: Secondary | ICD-10-CM | POA: Insufficient documentation

## 2013-12-22 DIAGNOSIS — E1149 Type 2 diabetes mellitus with other diabetic neurological complication: Secondary | ICD-10-CM

## 2013-12-22 DIAGNOSIS — E1159 Type 2 diabetes mellitus with other circulatory complications: Secondary | ICD-10-CM

## 2013-12-22 DIAGNOSIS — K219 Gastro-esophageal reflux disease without esophagitis: Secondary | ICD-10-CM

## 2013-12-22 DIAGNOSIS — M7500 Adhesive capsulitis of unspecified shoulder: Secondary | ICD-10-CM

## 2013-12-22 DIAGNOSIS — E1169 Type 2 diabetes mellitus with other specified complication: Secondary | ICD-10-CM

## 2013-12-22 DIAGNOSIS — IMO0001 Reserved for inherently not codable concepts without codable children: Secondary | ICD-10-CM

## 2013-12-22 DIAGNOSIS — E1165 Type 2 diabetes mellitus with hyperglycemia: Secondary | ICD-10-CM

## 2013-12-22 DIAGNOSIS — E1142 Type 2 diabetes mellitus with diabetic polyneuropathy: Secondary | ICD-10-CM

## 2013-12-22 DIAGNOSIS — I152 Hypertension secondary to endocrine disorders: Secondary | ICD-10-CM

## 2013-12-22 DIAGNOSIS — IMO0002 Reserved for concepts with insufficient information to code with codable children: Secondary | ICD-10-CM

## 2013-12-22 DIAGNOSIS — E1140 Type 2 diabetes mellitus with diabetic neuropathy, unspecified: Secondary | ICD-10-CM

## 2013-12-22 DIAGNOSIS — I10 Essential (primary) hypertension: Secondary | ICD-10-CM

## 2013-12-22 DIAGNOSIS — Z0000 Encounter for general adult medical examination without abnormal findings: Secondary | ICD-10-CM

## 2013-12-22 LAB — POCT GLYCOSYLATED HEMOGLOBIN (HGB A1C): HEMOGLOBIN A1C: 6.8

## 2013-12-22 MED ORDER — ASPIRIN 81 MG PO TBEC: 81.0000 mg | DELAYED_RELEASE_TABLET | Freq: Every day | ORAL | Status: DC

## 2013-12-22 MED ORDER — GLIPIZIDE 5 MG PO TABS: 5.0000 mg | ORAL_TABLET | Freq: Two times a day (BID) | ORAL | Status: DC

## 2013-12-22 MED ORDER — METFORMIN HCL 1000 MG PO TABS: 1000.0000 mg | ORAL_TABLET | Freq: Two times a day (BID) | ORAL | Status: DC

## 2013-12-22 MED ORDER — LISINOPRIL 2.5 MG PO TABS: 2.5000 mg | ORAL_TABLET | Freq: Every day | ORAL | Status: DC

## 2013-12-22 MED ORDER — ATORVASTATIN CALCIUM 40 MG PO TABS: 40.0000 mg | ORAL_TABLET | Freq: Every day | ORAL | Status: DC

## 2013-12-22 MED ORDER — MELOXICAM 7.5 MG PO TABS: 7.5000 mg | ORAL_TABLET | Freq: Every day | ORAL | Status: DC

## 2013-12-22 NOTE — Progress Notes (Signed)
Patient ID: Sandra Ryan, female   DOB: 03-Jul-1955, 58 y.o.   MRN: [id number]   Subjective:  Sandra Ryan is a 58 y.o. [place] female with a history of T2DM with retinopathy and neuropathy here for medication refill.  Shoulder pain, bilateral. Worse on the right side (dominant side): Physical therapy x3 sessions which helped and had an injection which actually worsened the pain. It did improve with using home exercises. She has not recently performed any of these home exercises or taken any medication for this pain. Morning walks and stretches for the past 7-8 months.   She also has pain in her extremities that feels like "burning numbness, hot pain" She is a vegetarian.   Mammography: Bi-RADS 1 06/18/2012 Colonoscopy: January 2014: Sessile polyp x2; 5 year follow up Pap smear: ASCUS 08/13/2011  Review of Systems:  Per HPI. All other systems reviewed and are negative. Objective:  BP 135/101  Pulse 71  Temp(Src) 98.4 F (36.9 C) (Oral)  Wt 111 lb 6.4 oz (50.531 kg)  Gen: Alert 58 y.o. female in NAD HEENT: MMM, EOMI, PERRL, anicteric sclerae CV: RRR, no murmur, no JVD Resp: Non-labored, CTAB, no wheezes noted Abd: Soft, NTND, BS present, no guarding or organomegaly MSK: No edema noted, full ROM Neuro: Alert and oriented, speech normal Shoulder: No deformities or dislocation; Full AROM in left shoulder without impingement. Right shoulder ROM limited     Chemistry      Component Value Date/Time   NA 134* 01/31/2012 1634   K 4.3 01/31/2012 1634   CL 100 01/31/2012 1634   CO2 23 01/31/2012 1634   BUN 10 01/31/2012 1634   CREATININE 0.70 01/31/2012 1634      Component Value Date/Time   CALCIUM 9.5 01/31/2012 1634     Lab Results  Component Value Date   HGBA1C 6.7 03/05/2013      Component Value Date/Time   LDLDIRECT 139* 01/31/2012 1634   Assessment:     [name] is a 58 y.o. female here for medication refill.    Plan:   Problem List Items Addressed This Visit     Cardiovascular and Mediastinum   High blood pressure associated with diabetes - Primary     Elevated on first check but 116/76 on recheck. Continue renoprotective ACE.     Relevant Medications      metFORMIN (GLUCOPHAGE) tablet      lisinopril (PRINIVIL,ZESTRIL) tablet      aspirin 81 MG EC tablet      atorvastatin (LIPITOR) tablet      glipiZIDE (GLUCOTROL) tablet   Other Relevant Orders      Lipid panel     Digestive   GERD (gastroesophageal reflux disease)     Endocrine   Diabetic neuropathy, type II diabetes mellitus   Relevant Medications      metFORMIN (GLUCOPHAGE) tablet      lisinopril (PRINIVIL,ZESTRIL) tablet      aspirin 81 MG EC tablet      atorvastatin (LIPITOR) tablet      glipiZIDE (GLUCOTROL) tablet     Nervous and Auditory   Peripheral neuropathy     Possibly from longstanding diabetes (previously on insulin) but will check B12 as pt is vegetarian and distribution is polyneuropathic. Pt not taking neurontin as this did not help and seems resistant to taking more medications so will not force this issue.     Relevant Orders      Vitamin B12     Musculoskeletal and Integument  Adhesive capsulitis of left shoulder     Restricted ROM on dominant arm. Rotator cuff intact. Previous injection unhelpful. Will refer back to [facility] clinic to get more advise on stretches exercises and resistance straps.  - Mobic 7.5mg  prn    Relevant Medications      meloxicam (MOBIC) tablet      aspirin 81 MG EC tablet     Other   Diabetes mellitus type II, uncontrolled     Chronic, with neuropathy. Foot exam wnl today. Continue current medications pending Hb A1c today.     Relevant Medications      metFORMIN (GLUCOPHAGE) tablet      lisinopril (PRINIVIL,ZESTRIL) tablet      aspirin 81 MG EC tablet      atorvastatin (LIPITOR) tablet      glipiZIDE (GLUCOTROL) tablet   Other Relevant Orders      HgB A1c      Basic Metabolic Panel      Lipid panel   Healthcare maintenance      Pap smear: ASCUS 08/13/2011 - Due for repeat pap with cotesting

## 2013-12-22 NOTE — Assessment & Plan Note (Signed)
Elevated on first check but 116/76 on recheck. Continue renoprotective ACE.

## 2013-12-22 NOTE — Assessment & Plan Note (Signed)
Pap smear: ASCUS 08/13/2011 - Due for repeat pap with cotesting

## 2013-12-22 NOTE — Assessment & Plan Note (Signed)
Possibly from longstanding diabetes (previously on insulin) but will check B12 as pt is vegetarian and distribution is polyneuropathic. Pt not taking neurontin as this did not help and seems resistant to taking more medications so will not force this issue.

## 2013-12-22 NOTE — Patient Instructions (Signed)
-   Please continue taking all the medications you brought today AND aspirin 81mg  once daily everyday.  - You can take mobic as needed for pain - You need a pap smear soon. You can schedule this with a female provider in the women's health clinic on your way out.   It was very nice to meet you,  - Dr. Bonner Puna

## 2013-12-22 NOTE — Addendum Note (Signed)
Addended by: Vance Gather B on: 12/22/2013 05:24 PM   Modules accepted: Orders

## 2013-12-22 NOTE — Assessment & Plan Note (Signed)
Restricted ROM on dominant arm. Rotator cuff intact. Previous injection unhelpful. Will refer back to [facility] clinic to get more advise on stretches exercises and resistance straps.  - Mobic 7.5mg  prn

## 2013-12-22 NOTE — Assessment & Plan Note (Signed)
Chronic, with neuropathy. Foot exam wnl today. Continue current medications pending Hb A1c today.

## 2013-12-23 LAB — VITAMIN B12: Vitamin B-12: 233 pg/mL (ref 211–911)

## 2013-12-23 LAB — LIPID PANEL
CHOL/HDL RATIO: 4 ratio
Cholesterol: 148 mg/dL (ref 0–200)
HDL: 37 mg/dL — AB (ref >39)
LDL CALC: 76 mg/dL (ref 0–99)
TRIGLYCERIDES: 174 mg/dL — AB (ref <150)
VLDL: 35 mg/dL (ref 0–40)

## 2013-12-23 LAB — BASIC METABOLIC PANEL WITH GFR
BUN: 7 mg/dL (ref 6–23)
CO2: 28 meq/L (ref 19–32)
Calcium: 9.8 mg/dL (ref 8.4–10.5)
Chloride: 105 meq/L (ref 96–112)
Creat: 0.63 mg/dL (ref 0.50–1.10)
Glucose, Bld: 131 mg/dL — ABNORMAL HIGH (ref 70–99)
Potassium: 4 meq/L (ref 3.5–5.3)
Sodium: 140 meq/L (ref 135–145)

## 2013-12-24 ENCOUNTER — Encounter: Payer: Self-pay | Admitting: Family Medicine

## 2013-12-24 DIAGNOSIS — E538 Deficiency of other specified B group vitamins: Secondary | ICD-10-CM | POA: Insufficient documentation

## 2013-12-24 MED ORDER — VITAMIN B-12 1000 MCG PO TABS: 1000.0000 ug | ORAL_TABLET | Freq: Every day | ORAL | Status: DC

## 2014-01-05 ENCOUNTER — Encounter: Payer: Self-pay | Admitting: Sports Medicine

## 2014-01-05 ENCOUNTER — Ambulatory Visit (INDEPENDENT_AMBULATORY_CARE_PROVIDER_SITE_OTHER): Payer: Medicaid Other | Admitting: Sports Medicine

## 2014-01-05 VITALS — BP 149/86 | Ht <= 58 in | Wt 110.0 lb

## 2014-01-05 DIAGNOSIS — M7521 Bicipital tendinitis, right shoulder: Secondary | ICD-10-CM

## 2014-01-05 MED ORDER — NITROGLYCERIN 0.2 MG/HR TD PT24: MEDICATED_PATCH | TRANSDERMAL | Status: DC

## 2014-01-05 NOTE — Progress Notes (Signed)
  Sandra Ryan - 58 y.o. female MRN [id number]  Date of birth: 1955/06/16  CC & HPI:  The patient presents as new patient for evaluation of: Right Shoulder Pain:  Location:  right anterior shoulder pain   Onset/duration:  2 months, insidious onset   Character:  moderate to severe, throbbing and tightening/aching of the right biceps and lateral arm worse with lifting and overhead reaching.   Alleviating:  rest is helpful   Therapies Tried:  anti-inflammatories by mouth   Associated Sx:  denies numbness, tingling or significant weakness in the right upper empty. No significant loss of range of motion but pain with terminal motion.     ROS:  Per HPI.   HISTORY: Past Medical, Surgical, Social, and Family History Reviewed & Updated per EMR.  Pertinent Historical Findings include: Prior left adhesive capsulitis with worsening symptoms following intra-articular injection. She does not want a repeat injection today  Type 2 diabetes, gastric esophageal reflux disease, history of tuberculosis Nepalese, requires interpreter  OBJECTIVE FINDINGS:  VS:   HT:4\' 10"  (147.3 cm)   WT:110 lb (49.896 kg)  BMI:23          BP:149/86 mmHg  HR: bpm  TEMP: ( )  RESP:   PHYSICAL EXAM: GENERAL:  adult [place] female. In no discomfort; no respiratory distress . Nep[place]e interpreter present throughout the entirety of exam   PSYCH: alert and appropriate, good insight   NEURO: Sensation is intact to light touch in upper extremities   VASCULAR: No significant upper extremity edema.    Right shoulder EXAM: Appearance:  overall normal-appearing, no significant swelling or effusion. Slightly prominent bilateral a.c. joints   Skin: No overlying erythema/ecchymosis.  Palpation: TTP over: Long head of the biceps  No TTP over: Brachial plexus, a.c. joint, pectoralis minor insertion   Strength & ROM: 5/5 Strength and full active ROM in: Internal rotation, external rotation, shoulder flexion, elbow flexion, supination.     Special Tests:  painful but normal strength with empty can, resisted supination and resisted elbow flexion. Positive speeds test, Bennie Hind, [place]      Limited MSK Ultrasound of right shoulder: Findings:  hypoechoic halo sign around the long head of the biceps with evidence of splitting of the biceps tendon at the bicipital groove. No fluid past pectoralis insertion. There is chronic thickening of the subscapularis and supraspinatus but no significant tears or retractions.   Impression: The above findings are consistent with split of the long head of the biceps with associated tendinitis      ASSESSMENT: 1. Biceps tendonitis, right   Failed conservative therapy including oral anti-inflammatories and patient is injection due to last experience  PLAN: See problem based charting & AVS for additional documentation. - Nitroglycerin protocol - HEP: Bicep curls, supination, internal/external rotation with Thera-Band. Thera-Band provided today. > Return in about 6 weeks (around 02/16/2014) for repeat ultrasound.   And consideration of continuing vs d/c of Nitro protocol

## 2014-01-06 ENCOUNTER — Other Ambulatory Visit: Payer: Self-pay | Admitting: *Deleted

## 2014-01-06 DIAGNOSIS — M7521 Bicipital tendinitis, right shoulder: Secondary | ICD-10-CM

## 2014-01-06 MED ORDER — NITROGLYCERIN 0.2 MG/HR TD PT24: MEDICATED_PATCH | TRANSDERMAL | Status: DC

## 2014-01-20 ENCOUNTER — Ambulatory Visit (INDEPENDENT_AMBULATORY_CARE_PROVIDER_SITE_OTHER): Payer: Medicaid Other | Admitting: Family Medicine

## 2014-01-20 ENCOUNTER — Other Ambulatory Visit (HOSPITAL_COMMUNITY)
Admission: RE | Admit: 2014-01-20 | Discharge: 2014-01-20 | Disposition: A | Discharge status: Home / Self Care | Payer: Medicaid Other | Source: Ambulatory Visit | Attending: Family Medicine | Admitting: Family Medicine

## 2014-01-20 VITALS — BP 137/104 | HR 111 | Temp 98.1°F | Wt 109.0 lb

## 2014-01-20 DIAGNOSIS — Z01411 Encounter for gynecological examination (general) (routine) with abnormal findings: Secondary | ICD-10-CM | POA: Insufficient documentation

## 2014-01-20 DIAGNOSIS — Z01419 Encounter for gynecological examination (general) (routine) without abnormal findings: Secondary | ICD-10-CM

## 2014-01-20 DIAGNOSIS — Z124 Encounter for screening for malignant neoplasm of cervix: Secondary | ICD-10-CM

## 2014-01-20 NOTE — Patient Instructions (Signed)
  It was nice seeing you today, your PAP went well, you do have small polyps which looks benign. I will call you with PAP result.   Pap Test A Pap test is a procedure done in a clinic office to evaluate cells that are on the surface of the cervix. The cervix is the lower portion of the uterus and upper portion of the vagina. For some women, the cervical region has the potential to form cancer. With consistent evaluations by your caregiver, this type of cancer can be prevented.  If a Pap test is abnormal, it is most often a result of a previous exposure to human papillomavirus (HPV). HPV is a virus that can infect the cells of the cervix and cause dysplasia. Dysplasia is where the cells no longer look normal. If a woman has been diagnosed with high-grade or severe dysplasia, they are at higher risk of developing cervical cancer. People diagnosed with low-grade dysplasia should still be seen by their caregiver because there is a small chance that low-grade dysplasia could develop into cancer.  LET YOUR CAREGIVER KNOW ABOUT:  Recent sexually transmitted infection (STI) you have had.  Any new sex partners you have had.  History of previous abnormal Pap tests results.  History of previous cervical procedures you have had (colposcopy, biopsy, loop electrosurgical excision procedure [LEEP]).  Concerns you have had regarding unusual vaginal discharge.  History of pelvic pain.  Your use of birth control. BEFORE THE PROCEDURE  Ask your caregiver when to schedule your Pap test. It is best not to be on your period if your caregiver uses a wooden spatula to collect cells or applies cells to a glass slide. Newer techniques are not so sensitive to the timing of a menstrual cycle.  Do not douche or have sexual intercourse for 24 hours before the test.   Do not use vaginal creams or tampons for 24 hours before the test.   Empty your bladder just before the test to lessen any discomfort.   PROCEDURE You will lie on an exam table with your feet in stirrups. A warm metal or plastic instrument (speculum) is placed in your vagina. This instrument allows your caregiver to see the inside of your vagina and look at your cervix. A small, plastic brush or wooden spatula is then used to collect cervical cells. These cells are placed in a lab specimen container. The cells are looked at under a microscope. A specialist will determine if the cells are normal.  AFTER THE PROCEDURE Make sure to get your test results.If your results come back abnormal, you may need further testing.  Document Released: 06/01/2002 Document Revised: 06/03/2011 Document Reviewed: 03/07/2011 [facility] Patient Information 2015 [place], [place]. This information is not intended to replace advice given to you by your health care provider. Make sure you discuss any questions you have with your health care provider.

## 2014-01-20 NOTE — Progress Notes (Addendum)
Patient ID: Sandra Ryan, female   DOB: March 03, 1956, 58 y.o.   MRN: [id number] Interpreter: Language resource: Bishnu Paudel   Subjective:     Sandra Ryan is a 58 y.o. female here for a routine exam.  Current complaints: None.  Personal health questionnaire reviewed: no.   Gynecologic History No LMP recorded. Patient is postmenopausal. Contraception: none Last Pap: 2013. Results were: abnormal Last mammogram: 2014. Results were: normal  Obstetric History OB History  Gravida Para Term Preterm AB SAB TAB Ectopic Multiple Living  6 6 6       6     # Outcome Date GA Lbr Len/2nd Weight Sex Delivery Anes PTL Lv  6 TRM           5 TRM           4 TRM           3 TRM           2 TRM           1 TRM                The following portions of the patient's history were reviewed and updated as appropriate: allergies, current medications, past family history, past medical history, past social history, past surgical history and problem list.  Review of Systems Pertinent items are noted in HPI.    Objective:    BP 137/104  Pulse 111  Temp(Src) 98.1 F (36.7 C) (Oral)  Wt 109 lb (49.442 kg) General appearance: alert Lungs: clear to auscultation bilaterally Heart: regular rate and rhythm, S1, S2 normal, no murmur, click, rub or gallop Abdomen: soft, non-tender; bowel sounds normal; no masses,  no organomegaly Pelvic: cervix normal in appearance, external genitalia normal, no adnexal masses or tenderness, no cervical motion tenderness, rectovaginal septum normal, uterus normal size, shape, and consistency, vagina normal without discharge and small polyps on cervix at about 11 o'clock    Addendum: Present of pelvic/uterine organ prolapse Assessment:    Healthy female exam.    Plan:    Education reviewed: Breast cancer screening recommended but she declined.. I will call with her PAP test result.

## 2014-01-21 LAB — CYTOLOGY - PAP

## 2014-01-25 ENCOUNTER — Telehealth: Payer: Self-pay | Admitting: Family Medicine

## 2014-01-25 ENCOUNTER — Encounter: Payer: Self-pay | Admitting: Family Medicine

## 2014-01-25 NOTE — Telephone Encounter (Signed)
Advised to call back about PAP test which was normal. I will mail result letter to her home address as well.

## 2014-02-16 ENCOUNTER — Ambulatory Visit: Payer: Medicaid Other | Admitting: Sports Medicine

## 2014-03-07 ENCOUNTER — Ambulatory Visit (INDEPENDENT_AMBULATORY_CARE_PROVIDER_SITE_OTHER): Payer: Medicaid Other | Admitting: Sports Medicine

## 2014-03-07 VITALS — BP 122/72

## 2014-03-07 DIAGNOSIS — M7501 Adhesive capsulitis of right shoulder: Secondary | ICD-10-CM

## 2014-03-07 DIAGNOSIS — M25511 Pain in right shoulder: Secondary | ICD-10-CM

## 2014-03-07 MED ORDER — METHYLPREDNISOLONE ACETATE 40 MG/ML IJ SUSP
40.0000 mg | Freq: Once | INTRAMUSCULAR | Status: AC
  Administered 2014-03-07: 40 mg via INTRA_ARTICULAR

## 2014-03-07 NOTE — Progress Notes (Signed)
   Subjective:    Patient ID: Sandra Ryan, female    DOB: 04-21-1955, 58 y.o.   MRN: [id number]  HPI   Patient comes in today for follow-up on right shoulder pain. She is now beginning to experience pain in the left shoulder as well. She was unable to get her nitroglycerin patches as they were not approved by her Medicaid. She continues to complain of diffuse pain and limited motion in the right shoulder. Pain in the left shoulder is similar but not as pronounced. Ultrasound done at the last visit showed fluid around the biceps tendon. She has previously had an intra-articular cortisone injection which did not help. Repeat cortisone injection was discussed at her last visit but the patient was not interested. Today however she would like to try a repeat injection. She is here today with an interpreter.    Review of Systems     Objective:   Physical Exam Well-developed, well-nourished. No acute distress. Awake alert and oriented 3. Vital signs reviewed.  Right shoulder: Patient has limited range of motion in all planes both actively and passively. Passive external rotation is to only about 45. She has pain and weakness with rotator cuff stressing and mild atrophy in the supraspinatus and infraspinatus fossas. No tenderness over the ac joint. No tenderness over the bicipital groove. Neurovascularly intact distally.  Left shoulder: Full range of motion. Positive painful arc. Full passive external rotation. Rotator cuff strength is 5/5 but reproducible of slight pain with resisted supraspinatus. No tenderness over the ac joint nor over the bicipital groove. Neurovascular intact distally.       Assessment & Plan:  Right shoulder pain secondary to adhesive capsulitis-question chronic rotator cuff tendinopathy/rotator cuff tearing Left shoulder pain likely secondary to rotator cuff tendinopathy versus early adhesive capsulitis  I'm going to inject the patient's right shoulder today with a  subacromial cortisone injection (no response to previous intra-articular injection) and have the patient follow-up with me in about 4 weeks. Although her previous ultrasound was noted not to show any obvious rotator cuff tear I would consider repeating her ultrasound at follow-up if she continues to have pain in her right shoulder. This may ultimately be followed by x-rays and an MRI. I am not convinced that this is an isolated biceps tendon issue. If she receives a positive response to today's injection I would consider injecting the left shoulder follow-up.  Consent obtained and verified. Time-out conducted. Noted no overlying erythema, induration, or other signs of local infection. Skin prepped in a sterile fashion. Topical analgesic spray: Ethyl chloride. Joint: right shoulder (subacromial) Needle: 25g 1.5 inch Completed without difficulty. Meds: 3cc 1% xylocaine, 1cc (40mg ) depomedrol  Advised to call if fevers/chills, erythema, induration, drainage, or persistent bleeding.

## 2014-04-04 ENCOUNTER — Ambulatory Visit (INDEPENDENT_AMBULATORY_CARE_PROVIDER_SITE_OTHER): Payer: Medicaid Other | Admitting: Sports Medicine

## 2014-04-04 ENCOUNTER — Ambulatory Visit
Admission: RE | Admit: 2014-04-04 | Discharge: 2014-04-04 | Disposition: A | Discharge status: Home / Self Care | Payer: Medicaid Other | Source: Ambulatory Visit | Attending: Sports Medicine | Admitting: Sports Medicine

## 2014-04-04 ENCOUNTER — Other Ambulatory Visit: Payer: Self-pay | Admitting: *Deleted

## 2014-04-04 VITALS — BP 131/85 | HR 93 | Ht 63.0 in | Wt 110.0 lb

## 2014-04-04 DIAGNOSIS — M25511 Pain in right shoulder: Secondary | ICD-10-CM

## 2014-04-04 DIAGNOSIS — M7501 Adhesive capsulitis of right shoulder: Secondary | ICD-10-CM

## 2014-04-04 NOTE — Patient Instructions (Signed)
Interpreter for patient this visit is [employer]

## 2014-04-04 NOTE — Progress Notes (Addendum)
   Subjective:    Patient ID: Sandra Ryan, female    DOB: December 08, 1955, 59 y.o.   MRN: [id number]  HPI  Patient comes in today for follow-up on right shoulder adhesive capsulitis. She is still having pain and severely limited range of motion despite both a previous intra-articular and subacromial cortisone injection. She is also beginning to experience similar symptoms in the left shoulder. She is here today with an interpreter.    Review of Systems     Objective:   Physical Exam Well-developed, well-nourished. No acute distress.  Right shoulder: Patient has severely limited active and passive range of motion in all planes including passive external rotation where I can only get her to about 15-20. It is difficult to evaluate rotator cuff strength due to her limited motion. She does have mild atrophy in the supraspinatus and infraspinous fossas. Neurovascularly intact distally.  Left shoulder: Good range of motion with a positive painful arc. Good passive range of motion. Neurovascular intact distally.       Assessment & Plan:  Persistent right shoulder pain secondary to adhesive capsulitis-rule out rotator cuff tear Left shoulder pain likely secondary to early adhesive capsulitis vs RC impingement  Patient has failed conservative treatment in regards to the right shoulder. Her range of motion is severely limited and I think an ultrasound would be limited as well. Therefore, I'm going to send her for x-rays of her right shoulder followed by an MRI. Patient will follow-up with me in the days following that study. We briefly discussed the possibility of manipulation under anesthesia and arthroscopy but we'll discuss that in further detail after her MRI.  Addendum: X-rays reviewed. Right shoulder films are unremarkable. Proceed with MRI scan as discussed above. Follow-up with me in the office a couple of days after that study to delineate further treatment.

## 2014-04-11 ENCOUNTER — Ambulatory Visit
Admission: RE | Admit: 2014-04-11 | Discharge: 2014-04-11 | Disposition: A | Discharge status: Home / Self Care | Payer: Medicaid Other | Source: Ambulatory Visit | Attending: Sports Medicine | Admitting: Sports Medicine

## 2014-04-11 DIAGNOSIS — M25511 Pain in right shoulder: Secondary | ICD-10-CM

## 2014-04-18 ENCOUNTER — Ambulatory Visit: Payer: Medicaid Other | Admitting: Sports Medicine

## 2014-04-25 ENCOUNTER — Ambulatory Visit (INDEPENDENT_AMBULATORY_CARE_PROVIDER_SITE_OTHER): Payer: Medicaid Other | Admitting: Sports Medicine

## 2014-04-25 ENCOUNTER — Encounter: Payer: Self-pay | Admitting: Sports Medicine

## 2014-04-25 VITALS — BP 110/74 | HR 96 | Ht 63.0 in | Wt 110.0 lb

## 2014-04-25 DIAGNOSIS — M7501 Adhesive capsulitis of right shoulder: Secondary | ICD-10-CM

## 2014-04-25 DIAGNOSIS — M25511 Pain in right shoulder: Secondary | ICD-10-CM

## 2014-04-25 MED ORDER — METHYLPREDNISOLONE ACETATE 40 MG/ML IJ SUSP
40.0000 mg | Freq: Once | INTRAMUSCULAR | Status: AC
  Administered 2014-04-25: 40 mg via INTRA_ARTICULAR

## 2014-04-25 MED ORDER — AMITRIPTYLINE HCL 10 MG PO TABS: 10.0000 mg | ORAL_TABLET | Freq: Every day | ORAL | Status: DC

## 2014-04-25 NOTE — Patient Instructions (Signed)
You have adhesive capsulitis (a frozen shoulder)  I've given you a cortisone injection today and I will send you back to physical therapy  We are going to try a medicine at night (amitriptyline)  See me again in 4 weeks

## 2014-04-25 NOTE — Progress Notes (Signed)
   Subjective:    Patient ID: Sandra Ryan, female    DOB: 1956/03/13, 59 y.o.   MRN: [id number]  HPI  Patient comes in today to discuss MRI findings of her right shoulder. MRI shows AC joint osteoarthritis, subacromial bursitis, rotator cuff tendinopathy but no discrete tear. She is still severely limited with her function in the shoulder. She is here today with her son as well as an interpreter.    Review of Systems     Objective:   Physical Exam Well-developed, well-nourished. No acute distress.  Right shoulder: Severely limited range of motion in all planes. Passive external rotation is to only about 15. Passive abduction is to 40. Strength was not tested. Neurovascularly intact distally.  Left shoulder: Full range of motion. Good strength.       Assessment & Plan:  Right shoulder pain secondary to severe adhesive capsulitis  Given the length of her symptoms and failure with conservative treatment to date I recommended surgical consultation to discuss merits of manipulation under anesthesia. However, the patient is hesitant to proceed with surgery. Therefore, we will try a repeat intra-articular cortisone injection and I will send her back to physical therapy. We will also try some amitriptyline at night. She will follow-up with me in 4 weeks. I did explain that adhesive capsulitis can resolve spontaneously but it may take as long as 18-24 months for that to happen.  Consent obtained and verified. Time-out conducted. Noted no overlying erythema, induration, or other signs of local infection. Skin prepped in a sterile fashion. Topical analgesic spray: Ethyl chloride. Joint: right shoulder (intraarticular) Needle: 25g 1.5 inch Completed without difficulty. Meds: 3cc 1% xylocaine, 1cc (40mg ) depomedrol  Advised to call if fevers/chills, erythema, induration, drainage, or persistent bleeding.

## 2014-05-09 ENCOUNTER — Ambulatory Visit: Payer: Medicaid Other | Admitting: Physical Therapy

## 2014-05-30 ENCOUNTER — Ambulatory Visit: Payer: Medicaid Other | Admitting: Sports Medicine

## 2014-06-06 ENCOUNTER — Ambulatory Visit (INDEPENDENT_AMBULATORY_CARE_PROVIDER_SITE_OTHER): Payer: Medicaid Other | Admitting: Sports Medicine

## 2014-06-06 ENCOUNTER — Encounter: Payer: Self-pay | Admitting: Sports Medicine

## 2014-06-06 VITALS — BP 133/84 | HR 92 | Ht 63.0 in | Wt 110.0 lb

## 2014-06-06 DIAGNOSIS — M7501 Adhesive capsulitis of right shoulder: Secondary | ICD-10-CM

## 2014-06-06 NOTE — Progress Notes (Signed)
   Subjective:    Patient ID: Sandra Ryan, female    DOB: 1955-09-20, 59 y.o.   MRN: [id number]  HPI  Patient comes in today for follow-up on right shoulder adhesive capsulitis. A second glenohumeral injection did not provide her with much relief. She still has tremendous stiffness and pain. She is also beginning to get compensatory pain across her chest, neck, and other shoulder. She has had 2 intra-articular injections without any relief. Symptoms have been present now for several months.    Review of Systems     Objective:   Physical Exam Well-developed, no acute distress  Right shoulder: Severely limited active and passive range of motion. Passive external rotation is only about 20. Left shoulder demonstrates good passive range of motion including good external rotation. Neurovascular intact distally.       Assessment & Plan:  Right shoulder pain secondary to severe adhesive capsulitis  Previous MRI scan showed no evidence of rotator cuff tear but she does have subacromial bursitis, ac joint osteoarthritis, and rotator cuff tendinopathy. However, I think her main issue is adhesive capsulitis. She has failed conservative treatment to date. I think she would do well with a manipulation under anesthesia. I will refer her to Dr. Mardelle Matte to discuss this further. Further workup and treatment will be per Dr. Luanna Cole discretion. Patient will follow-up with me when necessary.

## 2014-06-06 NOTE — Progress Notes (Signed)
Patient ID: Sandra Ryan, female   DOB: Aug 16, 1955, 59 y.o.   MRN: [id number]  Interpreter for visit is Taran Thapa

## 2014-09-27 ENCOUNTER — Ambulatory Visit (INDEPENDENT_AMBULATORY_CARE_PROVIDER_SITE_OTHER): Payer: Medicaid Other | Admitting: Family Medicine

## 2014-09-27 ENCOUNTER — Encounter: Payer: Self-pay | Admitting: Family Medicine

## 2014-09-27 VITALS — BP 121/82 | HR 90 | Temp 98.6°F | Wt 108.0 lb

## 2014-09-27 DIAGNOSIS — E1165 Type 2 diabetes mellitus with hyperglycemia: Secondary | ICD-10-CM

## 2014-09-27 DIAGNOSIS — Z1239 Encounter for other screening for malignant neoplasm of breast: Secondary | ICD-10-CM

## 2014-09-27 DIAGNOSIS — I10 Essential (primary) hypertension: Secondary | ICD-10-CM | POA: Diagnosis not present

## 2014-09-27 DIAGNOSIS — E1140 Type 2 diabetes mellitus with diabetic neuropathy, unspecified: Secondary | ICD-10-CM

## 2014-09-27 DIAGNOSIS — IMO0002 Reserved for concepts with insufficient information to code with codable children: Secondary | ICD-10-CM

## 2014-09-27 DIAGNOSIS — Z23 Encounter for immunization: Secondary | ICD-10-CM | POA: Diagnosis not present

## 2014-09-27 DIAGNOSIS — E1169 Type 2 diabetes mellitus with other specified complication: Secondary | ICD-10-CM | POA: Diagnosis not present

## 2014-09-27 DIAGNOSIS — E1159 Type 2 diabetes mellitus with other circulatory complications: Secondary | ICD-10-CM

## 2014-09-27 DIAGNOSIS — Z0000 Encounter for general adult medical examination without abnormal findings: Secondary | ICD-10-CM | POA: Diagnosis present

## 2014-09-27 LAB — POCT GLYCOSYLATED HEMOGLOBIN (HGB A1C): Hemoglobin A1C: 7.7

## 2014-09-27 MED ORDER — CETIRIZINE HCL 10 MG PO TABS: 10.0000 mg | ORAL_TABLET | Freq: Every day | ORAL | Status: DC

## 2014-09-27 MED ORDER — ATORVASTATIN CALCIUM 40 MG PO TABS: 40.0000 mg | ORAL_TABLET | Freq: Every day | ORAL | Status: DC

## 2014-09-27 NOTE — Progress Notes (Signed)
Subjective: Sandra Ryan is a 59 y.o. female with a history of HTN, T2DM with neuropathy here for chronic medical conditions.  She reports red, itchy burning eyes mostly for 2-3 months, always gets this seasonally. Has not tried anything. No discharge, congestion, rhinorrhea, cough, sore throat, fever.   She checks BP at home and they have been "good" all below 140/90. No CP, SOB.   Checks sugars sparingly in the morning, has difficulty remembering numbers but mentions mostly 200s. Lowest 120, highest 340.  Last ophtho > 1 yr ago   Health Maintenance:  - History of ASCUS w/neg high risk HPV 2013 followed by negative pap 2015.  - Due for mammogram - Former smoker - Review of Systems: Per HPI.   Objective: BP 121/82 mmHg  Pulse 90  Temp(Src) 98.6 F (37 C) (Oral)  Wt 108 lb (48.988 kg) Gen: Nepalese 59 y.o. female in no distress HEENT: MMM, EOMI, PERRL, anicteric sclerae CV: Regular rate, no murmur, rub or gallop, no JVD Resp: Non-labored, CTAB, no wheezes noted Abd: Soft, NT, ND, BS present, no guarding or organomegaly MSK: No edema noted, full ROM Neuro: Alert and oriented, gait normal  BMP Latest Ref Rng 12/22/2013 01/31/2012  Glucose 70 - 99 mg/dL 131(H) 401(H)  BUN 6 - 23 mg/dL 7 10  Creatinine 0.50 - 1.10 mg/dL 0.63 0.70  Sodium 135 - 145 mEq/L 140 134(L)  Potassium 3.5 - 5.3 mEq/L 4.0 4.3  Chloride 96 - 112 mEq/L 105 100  CO2 19 - 32 mEq/L 28 23  Calcium 8.4 - 10.5 mg/dL 9.8 9.5   Lab Results  Component Value Date   WBC 9.1 03/19/2012   HGB 12.9 03/19/2012   HCT 38.9 03/19/2012   MCV 89.1 03/19/2012   PLT 287.0 03/19/2012   No results found for: TSH Lab Results  Component Value Date   HGBA1C 7.7 09/27/2014  from 6.8  Lab Results  Component Value Date   CHOL 148 12/22/2013   HDL 37* 12/22/2013   LDLCALC 76 12/22/2013   LDLDIRECT 139* 01/31/2012   TRIG 174* 12/22/2013   CHOLHDL 4.0 12/22/2013    Assessment/Plan: Sandra Ryan is a 59 y.o. female here  for HTN, DM, chronic health maintenance.   See problem list.

## 2014-09-27 NOTE — Patient Instructions (Addendum)
Make sure you get your mammogram.  Take zyrtec everyday for your eye itching/burning Take all medications that are listed on this paper. If you don't have one, please call the pharmacy and have them call us for a refill.  I have refilled atorvastatin (cholesterol medication) You will be contacted about a referral to an eye doctor.  You will need a pap smear in 2018 We will have to call you about your blood sugars.

## 2014-09-28 NOTE — Assessment & Plan Note (Signed)
A1c has crept back up to 7.7. Formerly on insulin but recently very well controlled on metformin 2000mg  total daily dose, [facility] typical dose. We spoke at length about dietary changes as she endorses very frequent carbohydrates. I believe this changing significantly can easily get her to goal so we will try this and follow up in 3 months. Personally, next step would be [place] or invokana. On ASA, statin, ACE. Ophtho referral made. Foot exam today was normal.

## 2014-09-28 NOTE — Assessment & Plan Note (Signed)
Well controlled. Continue regimen. Will need recheck of BMP at next OV.

## 2014-10-11 ENCOUNTER — Encounter: Payer: Self-pay | Admitting: Family Medicine

## 2014-10-11 ENCOUNTER — Ambulatory Visit (INDEPENDENT_AMBULATORY_CARE_PROVIDER_SITE_OTHER): Payer: Medicaid Other | Admitting: Family Medicine

## 2014-10-11 VITALS — BP 134/72 | HR 114 | Temp 98.9°F | Ht 63.0 in | Wt 109.3 lb

## 2014-10-11 DIAGNOSIS — K219 Gastro-esophageal reflux disease without esophagitis: Secondary | ICD-10-CM

## 2014-10-11 DIAGNOSIS — R0789 Other chest pain: Secondary | ICD-10-CM | POA: Diagnosis not present

## 2014-10-11 DIAGNOSIS — M940 Chondrocostal junction syndrome [Tietze]: Secondary | ICD-10-CM | POA: Insufficient documentation

## 2014-10-11 MED ORDER — TRAMADOL HCL 50 MG PO TABS: 50.0000 mg | ORAL_TABLET | Freq: Three times a day (TID) | ORAL | Status: DC | PRN

## 2014-10-11 MED ORDER — OMEPRAZOLE 40 MG PO CPDR: 40.0000 mg | DELAYED_RELEASE_CAPSULE | Freq: Every day | ORAL | Status: DC

## 2014-10-11 NOTE — Patient Instructions (Signed)
Nice to meet you today. Please go to [facility] imaging, follow map, to get a chest x-ray to make sure you don't have any rib fractures. Someone will call you with the results of this chest x-ray when we have the results. You can take tramadol 1 pill every 8 hours as needed for pain. Please also resume taking omeprazole daily for acid reflux.  Come back to see her primary care doctor in 1 week if your symptoms have not resolved.  Take care, Dr. Jacinto Reap
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
## 2014-10-11 NOTE — Assessment & Plan Note (Signed)
Suspect epigastric burning is related to GERD Patient not taking omeprazole per report Resume omeprazole 40 mg daily

## 2014-10-12 ENCOUNTER — Emergency Department (HOSPITAL_COMMUNITY)
Admission: EM | Admit: 2014-10-12 | Discharge: 2014-10-12 | Disposition: A | Discharge status: Home / Self Care | Payer: Medicaid Other | Attending: Emergency Medicine | Admitting: Emergency Medicine

## 2014-10-12 ENCOUNTER — Emergency Department (HOSPITAL_COMMUNITY): Payer: Medicaid Other

## 2014-10-12 ENCOUNTER — Emergency Department (HOSPITAL_COMMUNITY)
Admission: EM | Admit: 2014-10-12 | Discharge: 2014-10-12 | Disposition: A | Discharge status: Nursing Home (Includes ICF) | Payer: Medicaid Other | Source: Home / Self Care | Attending: Family Medicine | Admitting: Family Medicine

## 2014-10-12 ENCOUNTER — Other Ambulatory Visit: Payer: Self-pay

## 2014-10-12 ENCOUNTER — Ambulatory Visit
Admission: RE | Admit: 2014-10-12 | Discharge: 2014-10-12 | Disposition: A | Discharge status: Home / Self Care | Payer: Medicaid Other | Source: Ambulatory Visit | Attending: Family Medicine | Admitting: Family Medicine

## 2014-10-12 ENCOUNTER — Encounter (HOSPITAL_COMMUNITY): Payer: Self-pay | Admitting: General Practice

## 2014-10-12 ENCOUNTER — Encounter (HOSPITAL_COMMUNITY): Payer: Self-pay | Admitting: Emergency Medicine

## 2014-10-12 DIAGNOSIS — K219 Gastro-esophageal reflux disease without esophagitis: Secondary | ICD-10-CM | POA: Diagnosis not present

## 2014-10-12 DIAGNOSIS — Z87891 Personal history of nicotine dependence: Secondary | ICD-10-CM | POA: Insufficient documentation

## 2014-10-12 DIAGNOSIS — M791 Myalgia: Secondary | ICD-10-CM | POA: Diagnosis not present

## 2014-10-12 DIAGNOSIS — S0990XA Unspecified injury of head, initial encounter: Secondary | ICD-10-CM | POA: Diagnosis not present

## 2014-10-12 DIAGNOSIS — Y998 Other external cause status: Secondary | ICD-10-CM | POA: Insufficient documentation

## 2014-10-12 DIAGNOSIS — Z8611 Personal history of tuberculosis: Secondary | ICD-10-CM | POA: Insufficient documentation

## 2014-10-12 DIAGNOSIS — Z791 Long term (current) use of non-steroidal anti-inflammatories (NSAID): Secondary | ICD-10-CM | POA: Insufficient documentation

## 2014-10-12 DIAGNOSIS — E119 Type 2 diabetes mellitus without complications: Secondary | ICD-10-CM | POA: Insufficient documentation

## 2014-10-12 DIAGNOSIS — S29011A Strain of muscle and tendon of front wall of thorax, initial encounter: Secondary | ICD-10-CM | POA: Diagnosis not present

## 2014-10-12 DIAGNOSIS — Z7982 Long term (current) use of aspirin: Secondary | ICD-10-CM | POA: Insufficient documentation

## 2014-10-12 DIAGNOSIS — Y9389 Activity, other specified: Secondary | ICD-10-CM | POA: Diagnosis not present

## 2014-10-12 DIAGNOSIS — M7918 Myalgia, other site: Secondary | ICD-10-CM

## 2014-10-12 DIAGNOSIS — G44031 Episodic paroxysmal hemicrania, intractable: Secondary | ICD-10-CM

## 2014-10-12 DIAGNOSIS — R112 Nausea with vomiting, unspecified: Secondary | ICD-10-CM | POA: Diagnosis not present

## 2014-10-12 DIAGNOSIS — R51 Headache: Secondary | ICD-10-CM

## 2014-10-12 DIAGNOSIS — Z79899 Other long term (current) drug therapy: Secondary | ICD-10-CM | POA: Insufficient documentation

## 2014-10-12 DIAGNOSIS — R519 Headache, unspecified: Secondary | ICD-10-CM

## 2014-10-12 DIAGNOSIS — Y92410 Unspecified street and highway as the place of occurrence of the external cause: Secondary | ICD-10-CM | POA: Diagnosis not present

## 2014-10-12 DIAGNOSIS — S29001A Unspecified injury of muscle and tendon of front wall of thorax, initial encounter: Secondary | ICD-10-CM | POA: Diagnosis present

## 2014-10-12 DIAGNOSIS — R0789 Other chest pain: Secondary | ICD-10-CM

## 2014-10-12 LAB — COMPREHENSIVE METABOLIC PANEL
ALK PHOS: 67 U/L (ref 38–126)
ALT: 26 U/L (ref 14–54)
AST: 22 U/L (ref 15–41)
Albumin: 3.7 g/dL (ref 3.5–5.0)
Anion gap: 10 (ref 5–15)
BILIRUBIN TOTAL: 0.7 mg/dL (ref 0.3–1.2)
BUN: 6 mg/dL (ref 6–20)
CHLORIDE: 99 mmol/L — AB (ref 101–111)
CO2: 25 mmol/L (ref 22–32)
Calcium: 8.8 mg/dL — ABNORMAL LOW (ref 8.9–10.3)
Creatinine, Ser: 0.62 mg/dL (ref 0.44–1.00)
GFR calc Af Amer: >60 mL/min (ref >60)
Glucose, Bld: 151 mg/dL — ABNORMAL HIGH (ref 65–99)
POTASSIUM: 4 mmol/L (ref 3.5–5.1)
Sodium: 134 mmol/L — ABNORMAL LOW (ref 135–145)
Total Protein: 7.8 g/dL (ref 6.5–8.1)

## 2014-10-12 LAB — I-STAT TROPONIN, ED: Troponin i, poc: 0 ng/mL (ref 0.00–0.08)

## 2014-10-12 LAB — CBC
HEMATOCRIT: 34.2 % — AB (ref 36.0–46.0)
Hemoglobin: 11.4 g/dL — ABNORMAL LOW (ref 12.0–15.0)
MCH: 29.4 pg (ref 26.0–34.0)
MCHC: 33.3 g/dL (ref 30.0–36.0)
MCV: 88.1 fL (ref 78.0–100.0)
PLATELETS: 280 10*3/uL (ref 150–400)
RBC: 3.88 MIL/uL (ref 3.87–5.11)
RDW: 12.5 % (ref 11.5–15.5)
WBC: 10.6 10*3/uL — ABNORMAL HIGH (ref 4.0–10.5)

## 2014-10-12 MED ORDER — KETOROLAC TROMETHAMINE 30 MG/ML IJ SOLN
30.0000 mg | Freq: Once | INTRAMUSCULAR | Status: AC
  Administered 2014-10-12: 30 mg via INTRAVENOUS
  Filled 2014-10-12: qty 1

## 2014-10-12 MED ORDER — SODIUM CHLORIDE 0.9 % IV BOLUS (SEPSIS)
1000.0000 mL | Freq: Once | INTRAVENOUS | Status: AC
  Administered 2014-10-12: 1000 mL via INTRAVENOUS

## 2014-10-12 MED ORDER — SODIUM CHLORIDE 0.9 % IV SOLN
1000.0000 mL | Freq: Once | INTRAVENOUS | Status: AC
  Administered 2014-10-12: 1000 mL via INTRAVENOUS

## 2014-10-12 MED ORDER — ONDANSETRON HCL 4 MG/2ML IJ SOLN
4.0000 mg | Freq: Once | INTRAMUSCULAR | Status: AC
  Administered 2014-10-12: 4 mg via INTRAVENOUS
  Filled 2014-10-12: qty 2

## 2014-10-12 MED ORDER — IBUPROFEN 400 MG PO TABS: 400.0000 mg | ORAL_TABLET | Freq: Four times a day (QID) | ORAL | Status: DC | PRN

## 2014-10-12 MED ORDER — ORPHENADRINE CITRATE ER 100 MG PO TB12: 100.0000 mg | ORAL_TABLET | Freq: Two times a day (BID) | ORAL | Status: DC

## 2014-10-12 NOTE — ED Notes (Signed)
Pt able to ambulate in hall to restroom without incident.  Gait steady and even.  Pt denies any dizziness.
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
## 2014-10-12 NOTE — Discharge Instructions (Signed)
Chest Wall Pain Chest wall pain is pain in or around the bones and muscles of your chest. It may take up to 6 weeks to get better. It may take longer if you must stay physically active in your work and activities.  CAUSES  Chest wall pain may happen on its own. However, it may be caused by:  A viral illness like the flu.  Injury.  Coughing.  Exercise.  Arthritis.  Fibromyalgia.  Shingles. HOME CARE INSTRUCTIONS   Avoid overtiring physical activity. Try not to strain or perform activities that cause pain. This includes any activities using your chest or your abdominal and side muscles, especially if heavy weights are used.  Put ice on the sore area.  Put ice in a plastic bag.  Place a towel between your skin and the bag.  Leave the ice on for 15-20 minutes per hour while awake for the first 2 days.  Only take over-the-counter or prescription medicines for pain, discomfort, or fever as directed by your caregiver. SEEK IMMEDIATE MEDICAL CARE IF:   Your pain increases, or you are very uncomfortable.  You have a fever.  Your chest pain becomes worse.  You have new, unexplained symptoms.  You have nausea or vomiting.  You feel sweaty or lightheaded.  You have a cough with phlegm (sputum), or you cough up blood. MAKE SURE YOU:   Understand these instructions.  Will watch your condition.  Will get help right away if you are not doing well or get worse. Document Released: 03/11/2005 Document Revised: 06/03/2011 Document Reviewed: 11/05/2010 [facility] Patient Information 2015 [place], [place]. This information is not intended to replace advice given to you by your health care provider. Make sure you discuss any questions you have with your health care provider. General Headache Without Cause A headache is pain or discomfort felt around the head or neck area. The specific cause of a headache may not be found. There are many causes and types of headaches. A few common ones  are:  Tension headaches.  Migraine headaches.  Cluster headaches.  Chronic daily headaches. HOME CARE INSTRUCTIONS   Keep all follow-up appointments with your caregiver or any specialist referral.  Only take over-the-counter or prescription medicines for pain or discomfort as directed by your caregiver.  Lie down in a dark, quiet room when you have a headache.  Keep a headache journal to find out what may trigger your migraine headaches. For example, write down:  What you eat and drink.  How much sleep you get.  Any change to your diet or medicines.  Try massage or other relaxation techniques.  Put ice packs or heat on the head and neck. Use these 3 to 4 times per day for 15 to 20 minutes each time, or as needed.  Limit stress.  Sit up straight, and do not tense your muscles.  Quit smoking if you smoke.  Limit alcohol use.  Decrease the amount of caffeine you drink, or stop drinking caffeine.  Eat and sleep on a regular schedule.  Get 7 to 9 hours of sleep, or as recommended by your caregiver.  Keep lights dim if bright lights bother you and make your headaches worse. SEEK MEDICAL CARE IF:   You have problems with the medicines you were prescribed.  Your medicines are not working.  You have a change from the usual headache.  You have nausea or vomiting. SEEK IMMEDIATE MEDICAL CARE IF:   Your headache becomes severe.  You have a fever.  You have a stiff neck.  You have loss of vision.  You have muscular weakness or loss of muscle control.  You start losing your balance or have trouble walking.  You feel faint or pass out.  You have severe symptoms that are different from your first symptoms. MAKE SURE YOU:   Understand these instructions.  Will watch your condition.  Will get help right away if you are not doing well or get worse. Document Released: 03/11/2005 Document Revised: 06/03/2011 Document Reviewed: 03/27/2011 [facility] Patient  Information 2015 [place], [place]. This information is not intended to replace advice given to you by your health care provider. Make sure you discuss any questions you have with your health care provider.
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
## 2014-10-17 ENCOUNTER — Telehealth: Payer: Self-pay | Admitting: Family Medicine

## 2014-10-17 NOTE — Telephone Encounter (Signed)
[facility]: Was referred for diabetic eye care. Because of language barrier, needs medical records Please advise

## 2014-10-18 NOTE — Telephone Encounter (Signed)
Juliann Pulse, are you able to help with this?

## 2014-10-21 ENCOUNTER — Other Ambulatory Visit: Payer: Self-pay | Admitting: Family Medicine

## 2014-10-21 DIAGNOSIS — Z1231 Encounter for screening mammogram for malignant neoplasm of breast: Secondary | ICD-10-CM

## 2014-10-24 ENCOUNTER — Ambulatory Visit: Payer: Medicaid Other

## 2014-10-24 ENCOUNTER — Ambulatory Visit
Admission: RE | Admit: 2014-10-24 | Discharge: 2014-10-24 | Disposition: A | Discharge status: Home / Self Care | Payer: Medicaid Other | Source: Ambulatory Visit | Attending: Family Medicine | Admitting: Family Medicine

## 2014-10-24 DIAGNOSIS — Z1231 Encounter for screening mammogram for malignant neoplasm of breast: Secondary | ICD-10-CM

## 2014-10-27 ENCOUNTER — Encounter: Payer: Self-pay | Admitting: Family Medicine

## 2014-10-27 ENCOUNTER — Ambulatory Visit (INDEPENDENT_AMBULATORY_CARE_PROVIDER_SITE_OTHER): Payer: Medicaid Other | Admitting: Family Medicine

## 2014-10-27 VITALS — BP 135/78 | HR 84 | Temp 98.1°F | Ht 60.0 in | Wt 108.2 lb

## 2014-10-27 DIAGNOSIS — S29019A Strain of muscle and tendon of unspecified wall of thorax, initial encounter: Secondary | ICD-10-CM | POA: Diagnosis not present

## 2014-10-27 DIAGNOSIS — S2341XA Sprain of ribs, initial encounter: Secondary | ICD-10-CM

## 2014-10-27 DIAGNOSIS — M940 Chondrocostal junction syndrome [Tietze]: Secondary | ICD-10-CM

## 2014-10-27 MED ORDER — IBUPROFEN 400 MG PO TABS: 400.0000 mg | ORAL_TABLET | Freq: Four times a day (QID) | ORAL | Status: DC | PRN
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
## 2014-10-27 NOTE — Patient Instructions (Signed)
Costochondritis Costochondritis, sometimes called Tietze syndrome, is a swelling and irritation (inflammation) of the tissue (cartilage) that connects your ribs with your breastbone (sternum). It causes pain in the chest and rib area. Costochondritis usually goes away on its own over time. It can take up to 6 weeks or longer to get better, especially if you are unable to limit your activities. CAUSES  Some cases of costochondritis have no known cause. Possible causes include:  Injury (trauma).  Exercise or activity such as lifting.  Severe coughing. SIGNS AND SYMPTOMS  Pain and tenderness in the chest and rib area.  Pain that gets worse when coughing or taking deep breaths.  Pain that gets worse with specific movements. DIAGNOSIS  Your health care provider will do a physical exam and ask about your symptoms. Chest X-rays or other tests may be done to rule out other problems. TREATMENT  Costochondritis usually goes away on its own over time. Your health care provider may prescribe medicine to help relieve pain. HOME CARE INSTRUCTIONS   Avoid exhausting physical activity. Try not to strain your ribs during normal activity. This would include any activities using chest, abdominal, and side muscles, especially if heavy weights are used.  Apply ice to the affected area for the first 2 days after the pain begins.  Put ice in a plastic bag.  Place a towel between your skin and the bag.  Leave the ice on for 20 minutes, 2-3 times a day.  Only take over-the-counter or prescription medicines as directed by your health care provider. SEEK MEDICAL CARE IF:  You have redness or swelling at the rib joints. These are signs of infection.  Your pain does not go away despite rest or medicine. SEEK IMMEDIATE MEDICAL CARE IF:   Your pain increases or you are very uncomfortable.  You have shortness of breath or difficulty breathing.  You cough up blood.  You have worse chest pains,  sweating, or vomiting.  You have a fever or persistent symptoms for more than 2-3 days.  You have a fever and your symptoms suddenly get worse. MAKE SURE YOU:   Understand these instructions.  Will watch your condition.  Will get help right away if you are not doing well or get worse.

## 2014-12-02 ENCOUNTER — Encounter: Payer: Self-pay | Admitting: Family Medicine

## 2014-12-02 ENCOUNTER — Ambulatory Visit (INDEPENDENT_AMBULATORY_CARE_PROVIDER_SITE_OTHER): Payer: Medicaid Other | Admitting: Family Medicine

## 2014-12-02 VITALS — BP 120/76 | HR 87 | Temp 98.2°F | Wt 113.0 lb

## 2014-12-02 DIAGNOSIS — R52 Pain, unspecified: Secondary | ICD-10-CM

## 2014-12-02 LAB — RHEUMATOID FACTOR: Rhuematoid fact SerPl-aCnc: <10 IU/mL (ref <=14)

## 2014-12-02 LAB — C-REACTIVE PROTEIN: CRP: <0.5 mg/dL (ref <0.60)

## 2014-12-02 LAB — SEDIMENTATION RATE: SED RATE: 10 mm/h (ref 0–30)

## 2014-12-02 NOTE — Patient Instructions (Signed)
Checking labwork today We will work on personal care services forms Follow up with Dr. Bonner Puna in a few weeks I'll call you with lab results Okay to continue ibuprofen until we have the labs  Be well, Dr. Ardelia Mems
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
## 2014-12-05 LAB — ANA: ANA: NEGATIVE

## 2014-12-06 ENCOUNTER — Telehealth: Payer: Self-pay | Admitting: Clinical

## 2014-12-06 NOTE — Telephone Encounter (Signed)
PCS form has been placed in Dr. Ellie Lunch box for completion. Form to be faxed to the number listed once completed.  Hunt Oris, MSW, [place]

## 2014-12-06 NOTE — Telephone Encounter (Signed)
-----   Message from Leeanne Rio, MD sent at 12/02/2014  1:08 PM EDT ----- Lucrezia Europe,  This pt wants personal care services and is a good candidate. Can you help get the paperwork started? I'll be happy to sign off on it.  Thanks! [place]

## 2014-12-09 NOTE — Telephone Encounter (Signed)
Form completed & placed in "to fax" pile. Leeanne Rio, MD

## 2014-12-16 ENCOUNTER — Ambulatory Visit: Payer: Medicaid Other | Admitting: Family Medicine

## 2014-12-20 ENCOUNTER — Ambulatory Visit: Payer: Medicaid Other | Admitting: Family Medicine

## 2014-12-30 ENCOUNTER — Ambulatory Visit (INDEPENDENT_AMBULATORY_CARE_PROVIDER_SITE_OTHER): Payer: Medicaid Other | Admitting: Family Medicine

## 2014-12-30 ENCOUNTER — Encounter: Payer: Self-pay | Admitting: Family Medicine

## 2014-12-30 VITALS — BP 128/84 | HR 88 | Temp 98.4°F | Ht 60.0 in | Wt 113.6 lb

## 2014-12-30 DIAGNOSIS — K219 Gastro-esophageal reflux disease without esophagitis: Secondary | ICD-10-CM

## 2014-12-30 DIAGNOSIS — I10 Essential (primary) hypertension: Secondary | ICD-10-CM | POA: Diagnosis not present

## 2014-12-30 DIAGNOSIS — E1159 Type 2 diabetes mellitus with other circulatory complications: Secondary | ICD-10-CM | POA: Diagnosis not present

## 2014-12-30 DIAGNOSIS — E1165 Type 2 diabetes mellitus with hyperglycemia: Secondary | ICD-10-CM | POA: Diagnosis not present

## 2014-12-30 DIAGNOSIS — IMO0001 Reserved for inherently not codable concepts without codable children: Secondary | ICD-10-CM

## 2014-12-30 DIAGNOSIS — Z23 Encounter for immunization: Secondary | ICD-10-CM

## 2014-12-30 DIAGNOSIS — E118 Type 2 diabetes mellitus with unspecified complications: Secondary | ICD-10-CM

## 2014-12-30 DIAGNOSIS — M940 Chondrocostal junction syndrome [Tietze]: Secondary | ICD-10-CM | POA: Diagnosis not present

## 2014-12-30 LAB — POCT GLYCOSYLATED HEMOGLOBIN (HGB A1C): HEMOGLOBIN A1C: 7.7

## 2014-12-30 MED ORDER — LISINOPRIL 2.5 MG PO TABS: 2.5000 mg | ORAL_TABLET | Freq: Every day | ORAL | Status: DC

## 2014-12-30 MED ORDER — OMEPRAZOLE 40 MG PO CPDR: 40.0000 mg | DELAYED_RELEASE_CAPSULE | Freq: Every day | ORAL | Status: DC

## 2014-12-30 MED ORDER — ATORVASTATIN CALCIUM 40 MG PO TABS: 40.0000 mg | ORAL_TABLET | Freq: Every day | ORAL | Status: DC

## 2014-12-30 MED ORDER — GLIPIZIDE 5 MG PO TABS: 5.0000 mg | ORAL_TABLET | Freq: Two times a day (BID) | ORAL | Status: DC

## 2014-12-30 MED ORDER — METFORMIN HCL 1000 MG PO TABS: 1000.0000 mg | ORAL_TABLET | Freq: Two times a day (BID) | ORAL | Status: DC

## 2014-12-30 NOTE — Assessment & Plan Note (Signed)
Continued, better with NSAID which she has not been taking very regularly (has most of ibuprofen left in pill bottle at visit today). Urged to continue to use this.
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
## 2014-12-30 NOTE — Assessment & Plan Note (Signed)
Remains well controlled. Continue current therapy.

## 2014-12-30 NOTE — Patient Instructions (Addendum)
I have refilled your medications including lipitor for cholesterol, lisinopril for blood pressure, metformin and glipizide for diabetes, and prilosec for heartburn/reflux. You can continue to take ibuprofen every 6 hours as needed for the pain in your chest.

## 2014-12-30 NOTE — Assessment & Plan Note (Signed)
Well controlled, continue current therapy. 

## 2014-12-30 NOTE — Assessment & Plan Note (Signed)
Stable at 7.7% also on ACE, ASA, statin. Reviewed lifestyle modifications and discussed addition of medications which she is strongly opposed to. Emphasized that this may be required at follow up.

## 2015-05-04 ENCOUNTER — Encounter: Payer: Self-pay | Admitting: Family Medicine

## 2015-05-04 ENCOUNTER — Ambulatory Visit (INDEPENDENT_AMBULATORY_CARE_PROVIDER_SITE_OTHER): Payer: Medicaid Other | Admitting: Family Medicine

## 2015-05-04 VITALS — BP 121/70 | HR 94 | Temp 98.3°F | Ht 60.0 in | Wt 115.1 lb

## 2015-05-04 DIAGNOSIS — Z114 Encounter for screening for human immunodeficiency virus [HIV]: Secondary | ICD-10-CM | POA: Diagnosis not present

## 2015-05-04 DIAGNOSIS — Z1159 Encounter for screening for other viral diseases: Secondary | ICD-10-CM | POA: Diagnosis not present

## 2015-05-04 DIAGNOSIS — M1990 Unspecified osteoarthritis, unspecified site: Secondary | ICD-10-CM | POA: Insufficient documentation

## 2015-05-04 DIAGNOSIS — M159 Polyosteoarthritis, unspecified: Secondary | ICD-10-CM

## 2015-05-04 DIAGNOSIS — E1159 Type 2 diabetes mellitus with other circulatory complications: Secondary | ICD-10-CM

## 2015-05-04 DIAGNOSIS — E1165 Type 2 diabetes mellitus with hyperglycemia: Secondary | ICD-10-CM | POA: Diagnosis not present

## 2015-05-04 DIAGNOSIS — I10 Essential (primary) hypertension: Secondary | ICD-10-CM | POA: Diagnosis not present

## 2015-05-04 DIAGNOSIS — M2550 Pain in unspecified joint: Secondary | ICD-10-CM

## 2015-05-04 DIAGNOSIS — E118 Type 2 diabetes mellitus with unspecified complications: Secondary | ICD-10-CM

## 2015-05-04 LAB — LIPID PANEL
CHOL/HDL RATIO: 4.7 ratio (ref <=5.0)
CHOLESTEROL: 222 mg/dL — AB (ref 125–200)
HDL: 47 mg/dL (ref >=46)
LDL Cholesterol: 128 mg/dL (ref <130)
TRIGLYCERIDES: 236 mg/dL — AB (ref <150)
VLDL: 47 mg/dL — AB (ref <30)

## 2015-05-04 LAB — POCT GLYCOSYLATED HEMOGLOBIN (HGB A1C): Hemoglobin A1C: 8

## 2015-05-04 MED ORDER — NAPROXEN 500 MG PO TABS: 500.0000 mg | ORAL_TABLET | Freq: Two times a day (BID) | ORAL | Status: DC

## 2015-05-04 MED ORDER — ACCU-CHEK AVIVA DEVI: Status: AC

## 2015-05-04 MED ORDER — DICLOFENAC SODIUM 1 % TD GEL: 2.0000 g | Freq: Four times a day (QID) | TRANSDERMAL | Status: DC

## 2015-05-04 MED ORDER — GLUCOSE BLOOD VI STRP: ORAL_STRIP | Status: DC

## 2015-05-04 MED ORDER — ACCU-CHEK SOFTCLIX LANCETS MISC: Status: DC

## 2015-05-04 NOTE — Patient Instructions (Addendum)
- Apply voltaren to the joints that hurt up to 4 times per day - Take naproxen twice a day for up to 1 month - Use the glucose meter twice a day to check your blood sugar - We are checking some blood tests today and we'll send you a letter with the results. - Follow up diabetes in 3 months.   MyPlate from USDA The general, healthful diet is based on the 2010 Dietary Guidelines for Americans. The amount of food you need to eat from each food group depends on your age, sex, and level of physical activity and can be individualized by a dietitian. Go to [email or web address] for more information. WHAT DO I NEED TO KNOW ABOUT THE [place] PLAN?  Enjoy your food, but eat less.   Avoid oversized portions.    of your plate should include fruits and vegetables.   of your plate should be grains.   of your plate should be protein. Grains  Make at least half of your grains whole grains.  For a 2,000 calorie daily food plan, eat 6 oz every day.  1 oz is about 1 slice bread, 1 cup cereal, or  cup cooked rice, cereal, or pasta. Vegetables  Make half your plate fruits and vegetables.  For a 2,000 calorie daily food plan, eat 2 cups every day.  1 cup is about 1 cup raw or cooked vegetables or vegetable juice or 2 cups raw leafy greens. Fruits  Make half your plate fruits and vegetables.  For a 2,000 calorie daily food plan, eat 2 cups every day.  1 cup is about 1 cup fruit or 100% fruit juice or  cup dried fruit. Protein  For a 2,000 calorie daily food plan, eat 5 oz every day.  1 oz is about 1 oz meat, poultry, or fish,  cup cooked beans, 1 egg, 1 Tbsp peanut butter, or  oz nuts or seeds. Dairy  Switch to fat-free or low-fat (1%) milk.  For a 2,000 calorie daily food plan, eat 3 cups every day.  1 cup is about 1 cup milk or yogurt or soy milk (soy beverage), 1 oz natural cheese, or 2 oz processed cheese. Fats, Oils, and Empty Calories  Only small amounts of oils are  recommended.  Empty calories are calories from solid fats or added sugars.  Compare sodium in foods like soup, bread, and frozen meals. Choose the foods with lower numbers.  Drink water instead of sugary drinks. WHAT FOODS CAN I EAT? Grains Whole grains such as whole wheat, quinoa, millet, and bulgur. Bread, rolls, and pasta made from whole grains. Brown or wild rice. Hot or cold cereals made from whole grains and without added sugar. Vegetables All fresh vegetables, especially fresh red, dark green, or orange vegetables. Peas and beans. Low-sodium frozen or canned vegetables prepared without added salt. Low-sodium vegetable juices. Fruits All fresh, frozen, and dried fruits. Canned fruit packed in water or fruit juice without added sugar. Fruit juices without added sugar. Meats and Other Protein Sources Boiled, baked, or grilled lean meat trimmed of fat. Skinless poultry. Fresh seafood and shellfish. Canned seafood packed in water. Unsalted nuts and unsalted nut butters. Tofu. Dried beans and pea. Eggs. Dairy Low-fat or fat-free milk, yogurt, and cheeses.  Sweets and Desserts Frozen desserts made from low-fat milk. Fats and Oils Olive, peanut, and canola oils and margarine. Salad dressing and mayonnaise made from these oils. Other Soups and casseroles made from allowed ingredients and without added fat or  salt. The items listed above may not be a complete list of recommended foods or beverages. Contact your dietitian for more options.  WHAT FOODS ARE NOT RECOMMENDED? Grains Sweetened, low-fiber cereals. Packaged baked goods. Snack crackers and chips. Cheese crackers, butter crackers, and biscuits. Frozen waffles, sweet breads, doughnuts, pastries, packaged baking mixes, pancakes, cakes, and cookies. Vegetables Regular canned or frozen vegetables or vegetables prepared with salt. Canned tomatoes. Canned tomato sauce. Fried vegetables. Vegetables in cream sauce or cheese  sauce. Fruits Fruits packed in syrup or made with added sugar.  Meats and Other Protein Sources Marbled or fatty meats such as ribs. Poultry with skin. Fried meats, poultry, eggs, or fish. Sausages, hot dogs, and deli meats such as pastrami, bologna, or salami. Dairy Whole milk, cream, cheeses made from whole milk, sour cream. Ice cream or yogurt made from whole milk or with added sugar. Beverages For adults, no more than one alcoholic drink per day. Regular soft drinks or other sugary beverages. Juice drinks. Sweets and Desserts Sugary or fatty desserts, candy, and other sweets. Fats and Oils Solid shortening or partially hydrogenated oils. Solid margarine. Margarine that contains trans fats. Butter. The items listed above may not be a complete list of foods and beverages to avoid. Contact your dietitian for more information.   This information is not intended to replace advice given to you by your health care provider. Make sure you discuss any questions you have with your health care provider.   Document Released: 03/31/2007 Document Revised: 04/01/2014 Document Reviewed: 02/17/2013 Elsevier Interactive Patient Education [employer].

## 2015-05-04 NOTE — Progress Notes (Signed)
Subjective: Sandra Ryan is a 60 y.o. female here for diabetes follow up.  [employer] video Nepali interpretor Sandra Ryan [phone] was used throughout this encounter.   She reports whole body aches for 2 - 3 years, getting worse, at first mild. Main areas of pain are hands, fingers. No swelling, fever, redness, warmth, injury.   Takes metformin and [facility], reporting 100% compliance. She has not checked blood sugars in the past few months because her glucometer broke. She admits eating more carbs over the recent holidays  - ROS: Denies fever, chills, weight loss, dizziness, vision changes, syncope, polyuria, nocturia, numbness, polydipsia, chest pain, new wounds.   Objective: BP 121/70 mmHg  Pulse 94  Temp(Src) 98.3 F (36.8 C) (Oral)  Ht 5' (1.524 m)  Wt 115 lb 1.6 oz (52.209 kg)  BMI 22.48 kg/m2 Gen: Well-appearing 60 y.o.female in no distress HEENT: Normocephalic, sclerae/conjunctivae clear, PERRL, MMM, posterior oropharynx clear, fair dentition Neck: Neck supple, no masses or lymphadenopathy; thyroid not enlarged  Pulm: Non-labored; CTAB, no wheezes  CV: Regular rate, no murmur appreciated; no LE edema, no JVD GI: Normoactive BS; soft, non-tender, non-distended, no HSM Skin: No wounds or rashes, no acanthosis nigricans See diabetic foot exam - simple  Neuro: CN II-XII without deficits, sensation intact to light touch, steady gait MSK: Hands with prominent joints without effusion, redness, tenderness to palpation.   Assessment & Plan: Sandra Ryan is a 59 y.o. female here for diabetes, worsening control, and osteoarthritis.    Diabetes mellitus type II, uncontrolled Hb A1c today is 8.0%, creeping very slowly upward from 7.7% in July 2016. On max dose metformin and [facility]. Discussed lifestyle modifications including specifically limitation of carbohydrates. Will need to add 3rd oral agent and would prefer not to increase hypoglycemia risk, would start Januvia, no hx of pancreatitis or FH medullary  thyroid CA, if not < 7% in 3 months. She would prefer not to start any more medications at this time. Has been on insulin in the past. - Creatinine stable, continue low dose ACE - Continue ASA, lipitor 47m - Prescribe glucometer covered by medicaid, to check blood sugars morning and evening.  - Missed last eye appointment, will reschedule.  - Follow up 3 months  High blood pressure associated with diabetes Well controlled, tolerating medications. Continue therapy.   Osteoarthritis Previous negative work up included RF, ANA, ESR, CRP. Rx short-trial NSAID po and topical, stretching/exercises.   Due for HIV, Hep C screening

## 2015-05-04 NOTE — Assessment & Plan Note (Signed)
Well controlled, tolerating medications. Continue therapy.

## 2015-05-04 NOTE — Assessment & Plan Note (Signed)
Hb A1c today is 8.0%, creeping very slowly upward from 7.7% in July 2016. On max dose metformin and [facility]. Discussed lifestyle modifications including specifically limitation of carbohydrates. Will need to add 3rd oral agent and would prefer not to increase hypoglycemia risk, would start Januvia, no hx of pancreatitis or FH medullary thyroid CA, if not < 7% in 3 months. She would prefer not to start any more medications at this time. Has been on insulin in the past. - Creatinine stable, continue low dose ACE - Continue ASA, lipitor 40mg  - Prescribe glucometer covered by medicaid, to check blood sugars morning and evening.  - Missed last eye appointment, will reschedule.  - Follow up 3 months

## 2015-05-04 NOTE — Assessment & Plan Note (Signed)
Previous negative work up included RF, ANA, ESR, CRP. Rx short-trial NSAID po and topical, stretching/exercises.

## 2015-05-05 ENCOUNTER — Encounter: Payer: Self-pay | Admitting: Family Medicine

## 2015-05-05 DIAGNOSIS — E1165 Type 2 diabetes mellitus with hyperglycemia: Principal | ICD-10-CM

## 2015-05-05 DIAGNOSIS — IMO0001 Reserved for inherently not codable concepts without codable children: Secondary | ICD-10-CM

## 2015-05-05 LAB — HEPATITIS C ANTIBODY: HCV Ab: NEGATIVE

## 2015-05-05 LAB — HIV ANTIBODY (ROUTINE TESTING W REFLEX): HIV 1&2 Ab, 4th Generation: NONREACTIVE

## 2015-05-05 MED ORDER — ATORVASTATIN CALCIUM 80 MG PO TABS: 80.0000 mg | ORAL_TABLET | Freq: Every day | ORAL | Status: DC

## 2015-05-05 NOTE — Progress Notes (Signed)
Quick Note:  Lipid profile while on lipitor 40mg , which she is tolerating, still puts her at ~10% 10-year ASCVD risk, so will increase lipitor to 80mg . Can we please call to let her know that she can take 2 tablets of her 40mg  prescription until she runs out and then she will need to start taking the 80mg  prescription I've sent in? Thank you! ______

## 2015-07-27 ENCOUNTER — Ambulatory Visit (INDEPENDENT_AMBULATORY_CARE_PROVIDER_SITE_OTHER): Payer: Medicaid Other | Admitting: Family Medicine

## 2015-07-27 ENCOUNTER — Encounter: Payer: Self-pay | Admitting: Family Medicine

## 2015-07-27 VITALS — BP 111/73 | HR 93 | Temp 98.5°F | Ht 60.0 in | Wt 114.5 lb

## 2015-07-27 DIAGNOSIS — E1165 Type 2 diabetes mellitus with hyperglycemia: Secondary | ICD-10-CM | POA: Diagnosis not present

## 2015-07-27 DIAGNOSIS — M159 Polyosteoarthritis, unspecified: Secondary | ICD-10-CM

## 2015-07-27 DIAGNOSIS — E118 Type 2 diabetes mellitus with unspecified complications: Secondary | ICD-10-CM

## 2015-07-27 DIAGNOSIS — I10 Essential (primary) hypertension: Secondary | ICD-10-CM | POA: Diagnosis not present

## 2015-07-27 DIAGNOSIS — E1159 Type 2 diabetes mellitus with other circulatory complications: Secondary | ICD-10-CM

## 2015-07-27 LAB — POCT GLYCOSYLATED HEMOGLOBIN (HGB A1C): Hemoglobin A1C: 7.7

## 2015-07-27 NOTE — Progress Notes (Signed)
Subjective: Sandra Ryan is a 60 y.o. female with a history of uncontrolled T2DM, HTN here for follow up.  - [employer] Nepali video interpretor used throughout encounter.  - Diabetes: She reports taking metformin 1g BID, glipizide 5mg  with complete adherence without hypoglycemic symptoms, though she at times feels light-headed. When she checks her CBG at these times it is usually 200 - 250mg /dl. Otherwise she check CBGs once in the morning with range, as read on glucometer by myself, 99 - 140 fasting. She has scheduled but not made it to the ophthalmologist yet.   - Arthritis: Still has unchanged pain without swelling in many joints of her hands, shoulders, hips and knees with morning stiffness lasting about 15 min. Better after a few minutes of activity. No redness or warmth of an joints.   - ROS: Denies fever, chills, weight loss, vision changes, syncope, polyuria, nocturia, numbness, polydipsia, chest pain, new wounds.  - Former smoker.   Objective: BP 111/73 mmHg  Pulse 93  Temp(Src) 98.5 F (36.9 C) (Oral)  Ht 5' (1.524 m)  Wt 114 lb 8 oz (51.937 kg)  BMI 22.36 kg/m2 Gen:  60 y.o. female in no distress HEENT: MMM, EOMI, PERRL, anicteric sclerae CV: Regular rate, no murmur, rub or gallop, no JVD Resp: Non-labored, CTAB, no wheezes noted Abd: Soft, NT, ND, BS present, no guarding or organomegaly MSK: No edema noted, full ROM Neuro: Alert and oriented, speech normal  Assessment/Plan: Sandra Ryan is a 60 y.o. female here for diabetes, HTN, OA.  High blood pressure associated with diabetes At goal. Continue baby dose lisinopril  Osteoarthritis Recommend continuing APAP, prescribed today. Provided reassurance and reinforced that ongoing activity is the mainstay of therapy.   Diabetes mellitus type II, uncontrolled Still not at goal, continuing in high 7% A1c. She is amenable to adding [place]. Will start at low dose and see in 3 months how this has affected her. May increase this  at that time.   Health Maintenance:  - Had ASCUS on pap with negative HPV cotesting in 2013. Repeat pap without cotesting 01/20/2014 negative cytology.  - Mammogram 01/20/2014 Bi-RADS 1 - Zostavax discussed: Declined by pt today.

## 2015-07-27 NOTE — Assessment & Plan Note (Signed)
At goal. Continue baby dose lisinopril

## 2015-07-28 MED ORDER — ACETAMINOPHEN 500 MG PO TABS: 500.0000 mg | ORAL_TABLET | Freq: Four times a day (QID) | ORAL | Status: DC | PRN

## 2015-07-28 MED ORDER — SITAGLIPTIN PHOSPHATE 50 MG PO TABS: 50.0000 mg | ORAL_TABLET | Freq: Every day | ORAL | Status: DC

## 2015-07-28 NOTE — Assessment & Plan Note (Signed)
Recommend continuing APAP, prescribed today. Provided reassurance and reinforced that ongoing activity is the mainstay of therapy.

## 2015-07-28 NOTE — Assessment & Plan Note (Signed)
Still not at goal, continuing in high 7% A1c. She is amenable to adding [place]. Will start at low dose and see in 3 months how this has affected her. May increase this at that time.

## 2015-10-25 IMAGING — MG MM SCREEN MAMMOGRAM BILATERAL
4 series · 4 of 4 positions shown · non-contrast
Comparison: Previous exam(s).

CLINICAL DATA: Screening.

EXAM:
DIGITAL SCREENING BILATERAL MAMMOGRAM WITH CAD

[R CC]
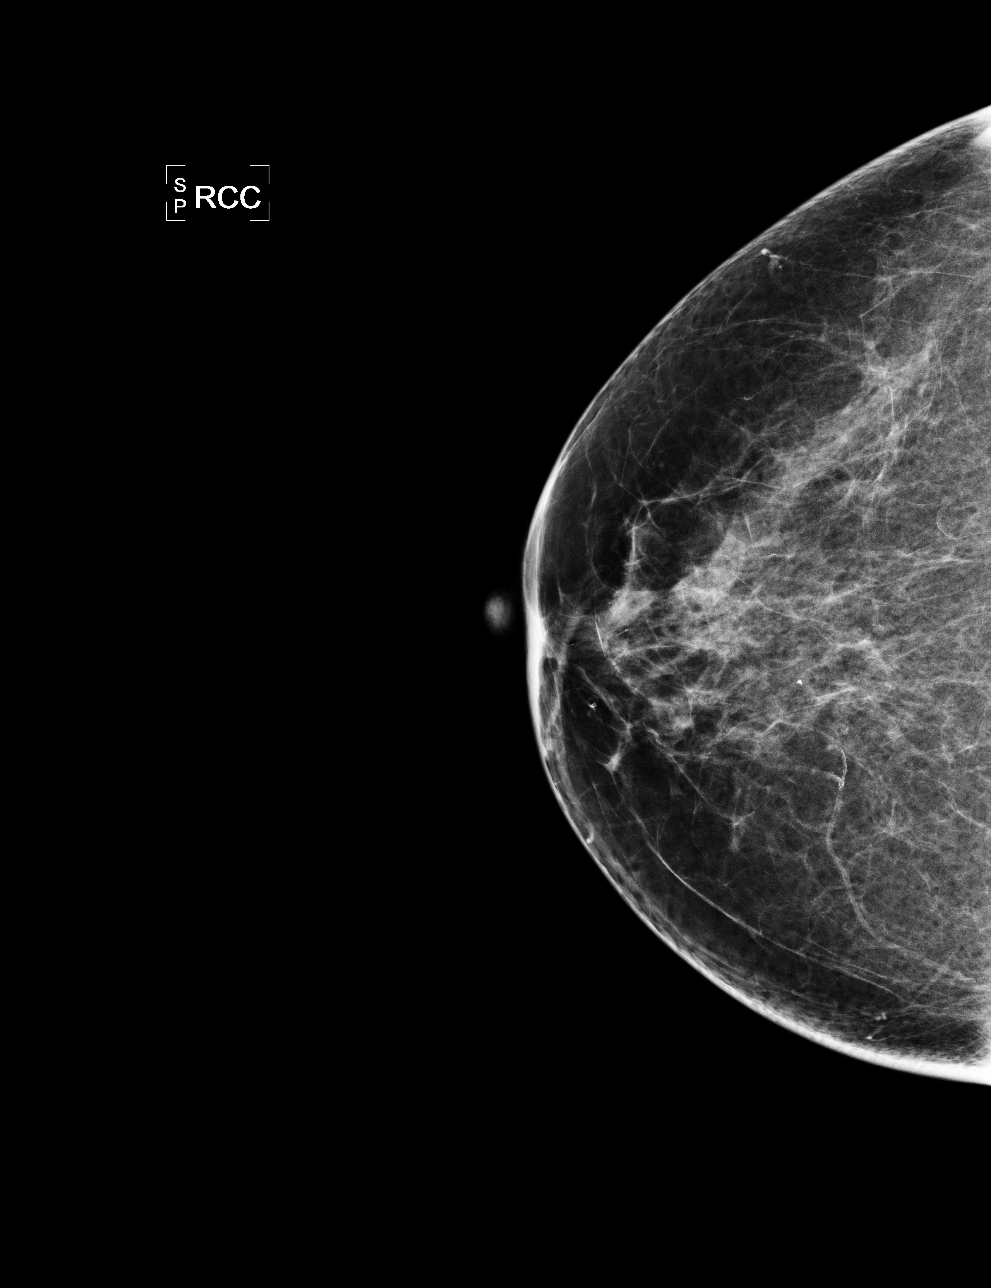

[L CC]
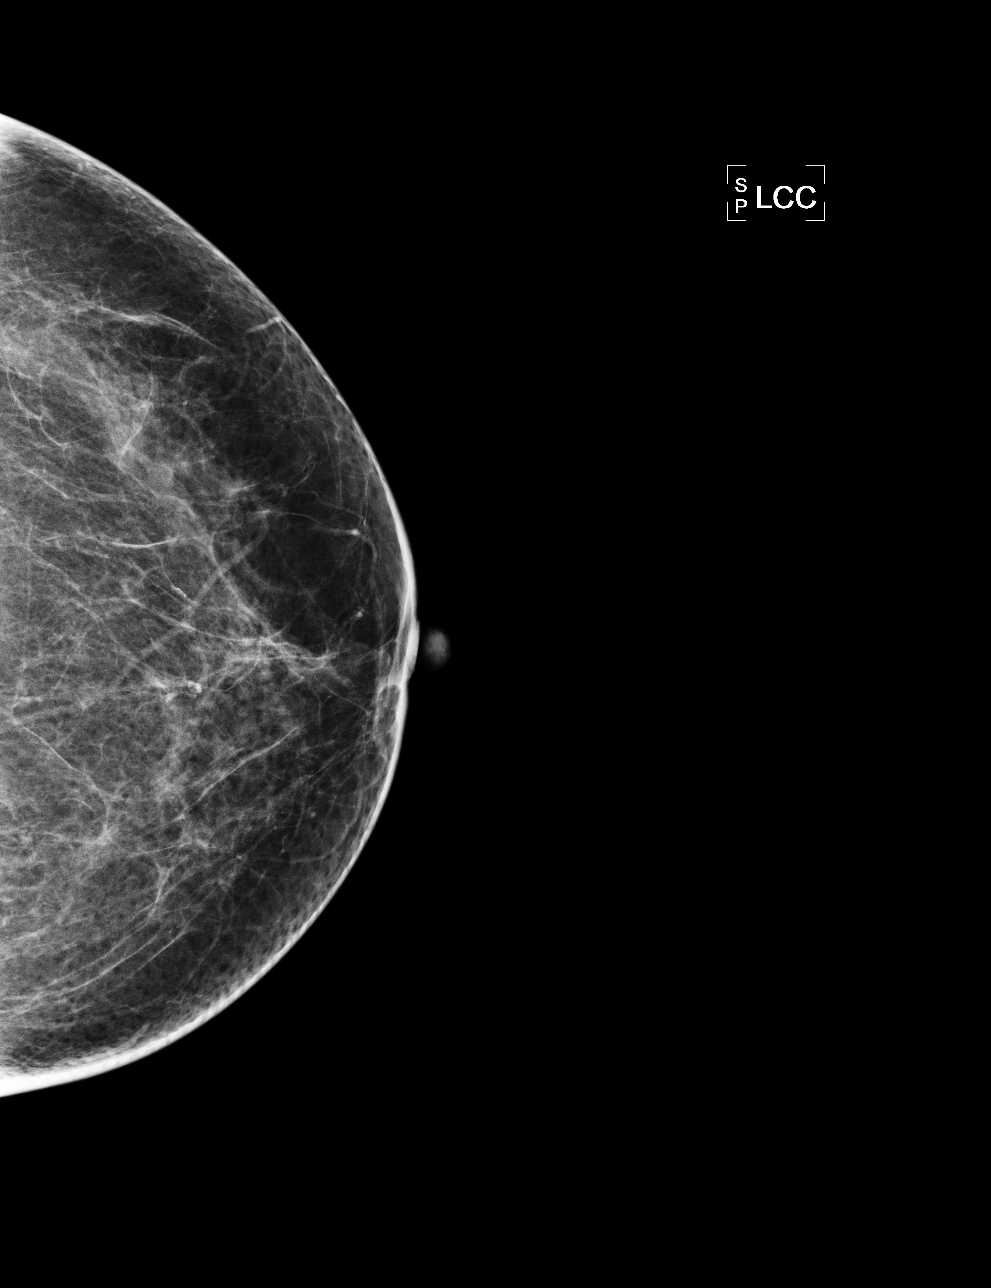

[L MLO]
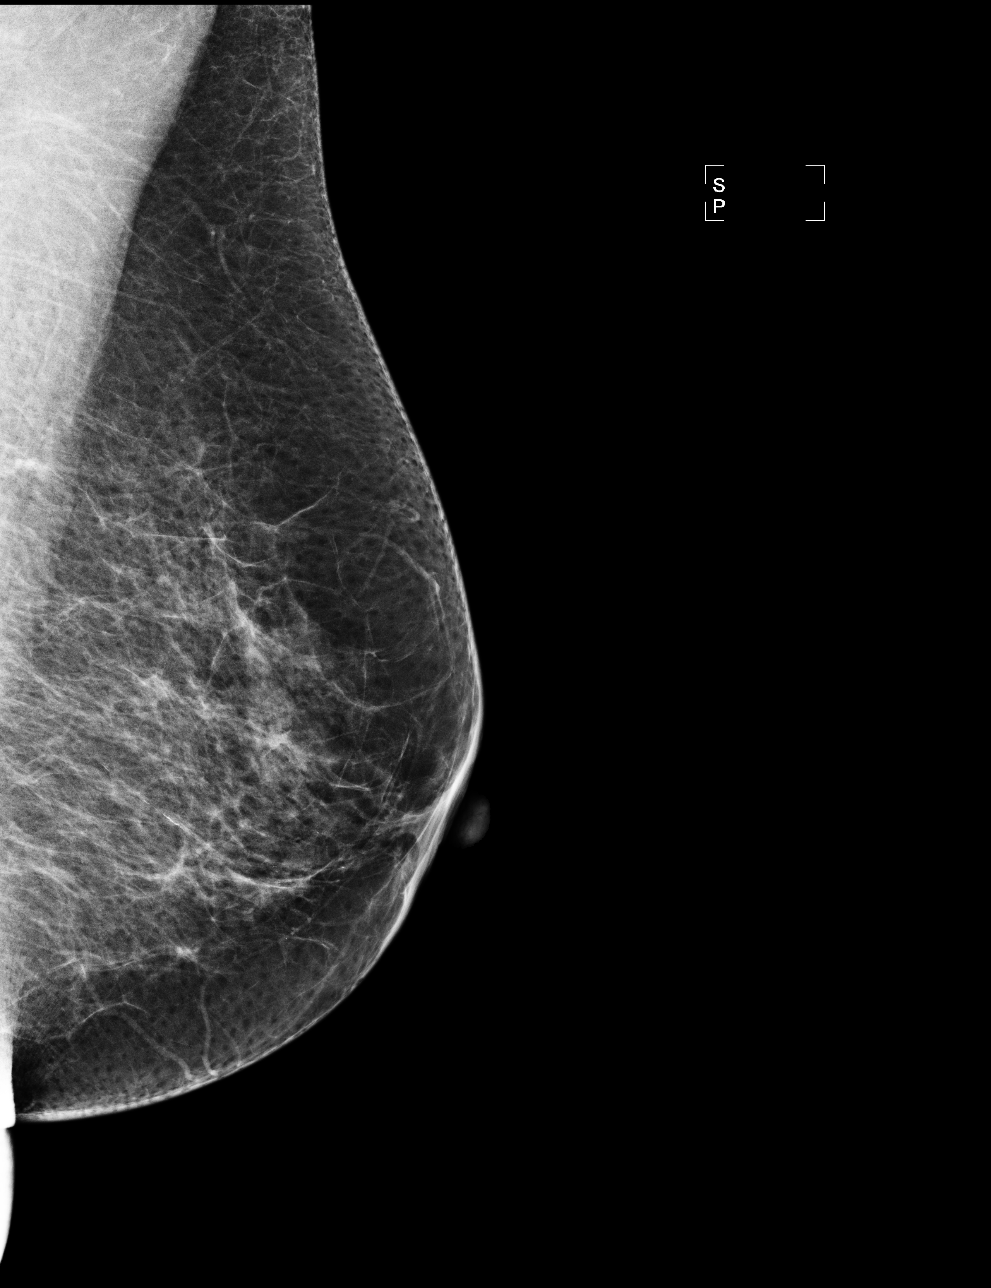

[R MLO]
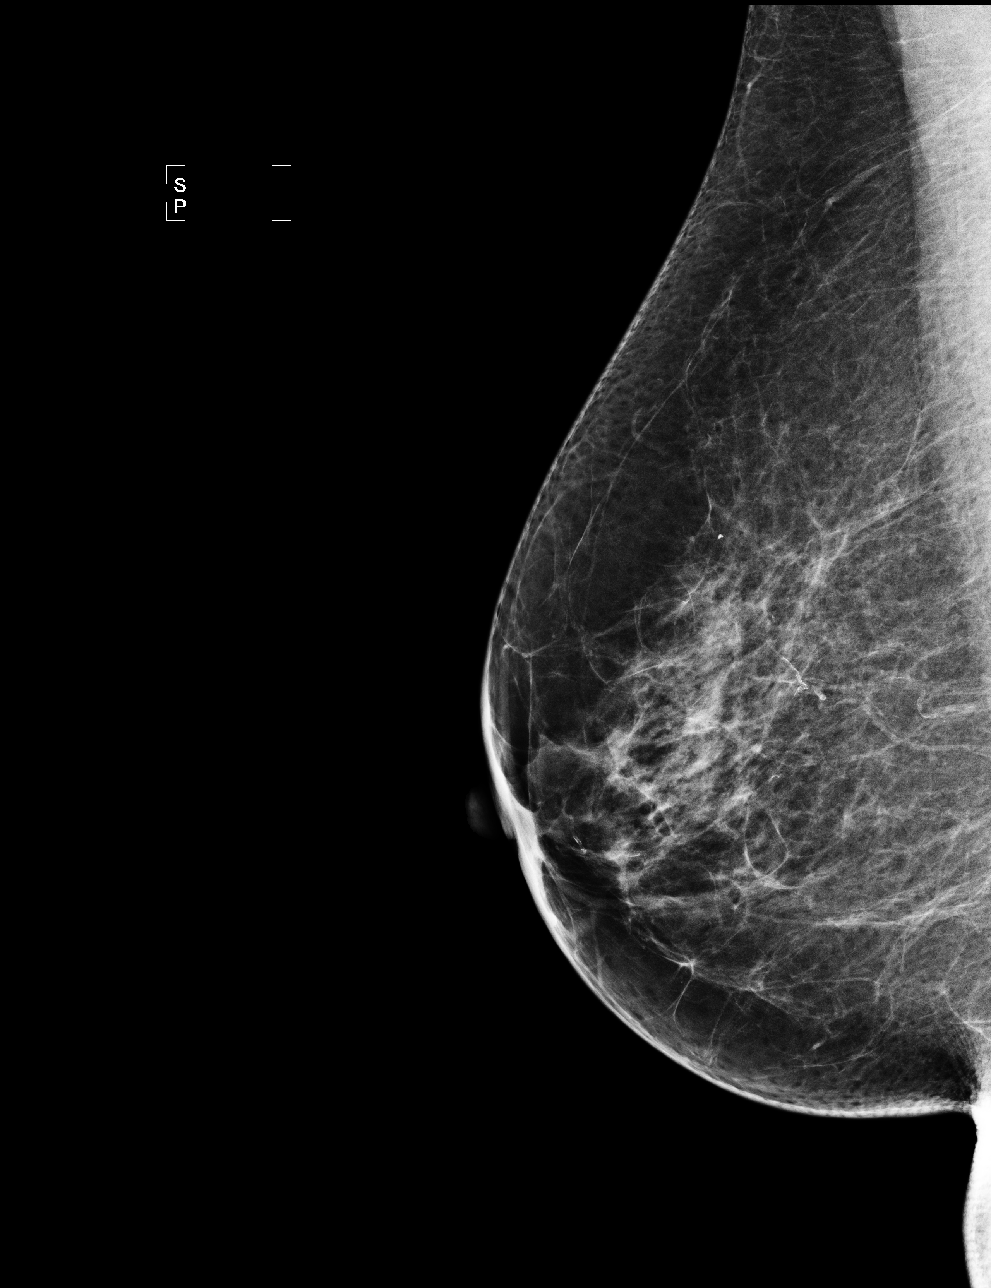

[4 of 4 positions shown; findings below may reference images not displayed]

ACR Breast Density Category b: There are scattered areas of
fibroglandular density.
FINDINGS: There are no findings suspicious for malignancy. Images were
processed with CAD.
IMPRESSION: No mammographic evidence of malignancy. A result letter of this
screening mammogram will be mailed directly to the patient.

RECOMMENDATION:
Screening mammogram in one year. (Code:AS-G-LCT)

BI-RADS CATEGORY  1: Negative.

## 2016-01-02 ENCOUNTER — Encounter: Payer: Self-pay | Admitting: Student

## 2016-01-02 ENCOUNTER — Ambulatory Visit (INDEPENDENT_AMBULATORY_CARE_PROVIDER_SITE_OTHER): Payer: Medicaid Other | Admitting: Student

## 2016-01-02 VITALS — BP 127/61 | HR 93 | Temp 98.6°F | Wt 121.0 lb

## 2016-01-02 DIAGNOSIS — R079 Chest pain, unspecified: Secondary | ICD-10-CM

## 2016-01-02 DIAGNOSIS — K5900 Constipation, unspecified: Secondary | ICD-10-CM | POA: Insufficient documentation

## 2016-01-02 DIAGNOSIS — Z23 Encounter for immunization: Secondary | ICD-10-CM | POA: Diagnosis not present

## 2016-01-02 DIAGNOSIS — M940 Chondrocostal junction syndrome [Tietze]: Secondary | ICD-10-CM

## 2016-01-02 DIAGNOSIS — B353 Tinea pedis: Secondary | ICD-10-CM

## 2016-01-02 MED ORDER — DOCUSATE SODIUM 100 MG PO CAPS: 100.0000 mg | ORAL_CAPSULE | Freq: Two times a day (BID) | ORAL | 0 refills | Status: DC

## 2016-01-02 MED ORDER — NAPROXEN 500 MG PO TABS: 500.0000 mg | ORAL_TABLET | Freq: Two times a day (BID) | ORAL | 0 refills | Status: DC

## 2016-01-02 MED ORDER — TERBINAFINE HCL 1 % EX CREA: 1.0000 "application " | TOPICAL_CREAM | Freq: Two times a day (BID) | CUTANEOUS | 0 refills | Status: DC

## 2016-01-02 NOTE — Progress Notes (Signed)
   Subjective:    Patient ID: Sandra Ryan is a 60 y.o. old female. [place] interpreter ID number [phone] was used for this encounter  HPI #Chest pain/back pain: this has been going on for 3-4 days. Chest pain over the middle aspect of her chest in circular fasion and upper thoracic areas.  Both places hurts at the same time. Denies trouble breathing. Pain is constant. Used to take the medicine for pain and she ran out. She has similar pain in the past but this is worse. Denies other symptoms such as fever, nausea, emesis, diaphoresis. Reports dizziness. She couldn't characterize the dizziness. Off note, she had history of costochondritis.  #Toe pain: Stumbled and had a cut from lower aspect about a month ago. She was using her her chest pain medicine (Voltaren gel) that helped. She has some of the Voltaren gel at home.   #Constipation: Reports straining when she goes to bathroom. Denies blood in stool. States that rice is part of her regular meal. Doesn't believe this has a role to play.   PMH: reviewed  Review of Systems Per HPI Objective:   Vitals:   01/02/16 1105  BP: 127/61  Pulse: 93  Temp: 98.6 F (37 C)  TempSrc: Oral  Weight: 121 lb (54.9 kg)    GEN: appears well, no apparent distress. CVS: RRR, normal s1 and s2, no murmurs, no edema RESP: no increased work of breathing, good air movement bilaterally, no crackles or wheeze GI: Bowel sounds present and normal, soft, non-tender,non-distended [id number] to palpation over his chest and back. Pain similar to her complaint was elicited with palpation.  -Mild swelling over her right fifth toe. Dry skin fissure over the plantar and proximal aspect of her right fifth toe. SKIN: Negative NEURO: alert and oriented appropriately, no gross defecits  PSYCH: appropriate mood and affect     Assessment & Plan:  Costochondritis Refilled her naproxen today. Advised her to take it with food. Low suspicion for ACS with reproducible pain  with palpation. Discussed return precautions as listed on AVS.  Tinea pedis of left foot Mild swelling over her right fifth toe. Dry skin fissure over the plantar and proximal aspect of her right fifth toe. She has history of diabetes. We will treat with terbinafine cream for 2 weeks  Constipation Recommended diet [place] in fibers and cutting down on rice. Patient doesn't believe her diet has contribution to this. Also gave prescription for Colace  Patient received a flu shot today

## 2016-01-02 NOTE — Assessment & Plan Note (Signed)
Recommended diet [place] in fibers and cutting down on rice. Patient doesn't believe her diet has contribution to this. Also gave prescription for Colace

## 2016-01-02 NOTE — Patient Instructions (Addendum)
It was great seeing you today! We have addressed the following issues today  1. Chest pain: This is likely related to chest muscles. I don't think this comes from your lung or heart. I sent a prescription for naproxen to the pharmacy. Please take this medication with food so that it won't irritate your stomach. However, if you chest pain gets worse or if you have trouble breathing or other symptoms concerning to you,  please come back and see [place] or go to emergency department 2. Toe pain: I sent a prescription for terbinafine cream to the pharmacy. You this cream for 2 weeks. If you notice swelling, more pain or fever, please come back and see [place] 3. Constipation: I sent a prescription for Colace to the pharmacy. I also recommend eating diets reach in fiber such as vegetables. Rice can worsen constipation.     If we did any lab work today, and the results require attention, either me or my nurse will get in touch with you. If everything is normal, you will get a letter in mail. If you don't hear from [place] in two weeks, please give [place] a call. Otherwise, I look forward to talking with you again at our next visit. If you have any questions or concerns before then, please call the clinic at [phone].  Please bring all your medications to every doctors visit   Sign up for My Chart to have easy access to your labs results, and communication with your Primary care physician.    Please check-out at the front desk before leaving the clinic.   Take Care,

## 2016-01-02 NOTE — Assessment & Plan Note (Signed)
Mild swelling over her right fifth toe. Dry skin fissure over the plantar and proximal aspect of her right fifth toe. She has history of diabetes. We will treat with terbinafine cream for 2 weeks

## 2016-01-02 NOTE — Assessment & Plan Note (Addendum)
Refilled her naproxen today. Advised her to take it with food. Low suspicion for ACS with reproducible pain with palpation. Discussed return precautions as listed on AVS.

## 2016-01-08 ENCOUNTER — Other Ambulatory Visit: Payer: Self-pay | Admitting: *Deleted

## 2016-01-08 DIAGNOSIS — E1165 Type 2 diabetes mellitus with hyperglycemia: Secondary | ICD-10-CM

## 2016-01-08 DIAGNOSIS — IMO0001 Reserved for inherently not codable concepts without codable children: Secondary | ICD-10-CM

## 2016-01-08 DIAGNOSIS — K219 Gastro-esophageal reflux disease without esophagitis: Secondary | ICD-10-CM

## 2016-01-08 MED ORDER — METFORMIN HCL 1000 MG PO TABS: 1000.0000 mg | ORAL_TABLET | Freq: Two times a day (BID) | ORAL | 6 refills | Status: DC

## 2016-01-08 MED ORDER — LISINOPRIL 2.5 MG PO TABS: 2.5000 mg | ORAL_TABLET | Freq: Every day | ORAL | 3 refills | Status: DC

## 2016-01-08 MED ORDER — OMEPRAZOLE 40 MG PO CPDR: 40.0000 mg | DELAYED_RELEASE_CAPSULE | Freq: Every day | ORAL | 3 refills | Status: DC

## 2016-01-08 MED ORDER — GLIPIZIDE 5 MG PO TABS: 5.0000 mg | ORAL_TABLET | Freq: Two times a day (BID) | ORAL | 3 refills | Status: DC

## 2016-03-12 ENCOUNTER — Encounter: Payer: Self-pay | Admitting: Family Medicine

## 2016-03-12 ENCOUNTER — Ambulatory Visit (INDEPENDENT_AMBULATORY_CARE_PROVIDER_SITE_OTHER): Payer: Medicaid Other | Admitting: Family Medicine

## 2016-03-12 VITALS — BP 92/58 | HR 101 | Temp 99.1°F | Ht 60.0 in | Wt 125.8 lb

## 2016-03-12 DIAGNOSIS — E138 Other specified diabetes mellitus with unspecified complications: Secondary | ICD-10-CM | POA: Diagnosis not present

## 2016-03-12 DIAGNOSIS — F32A Depression, unspecified: Secondary | ICD-10-CM

## 2016-03-12 DIAGNOSIS — Z1329 Encounter for screening for other suspected endocrine disorder: Secondary | ICD-10-CM | POA: Diagnosis not present

## 2016-03-12 DIAGNOSIS — F329 Major depressive disorder, single episode, unspecified: Secondary | ICD-10-CM

## 2016-03-12 DIAGNOSIS — R251 Tremor, unspecified: Secondary | ICD-10-CM | POA: Diagnosis not present

## 2016-03-12 LAB — CBC WITH DIFFERENTIAL/PLATELET
BASOS PCT: 0 %
Basophils Absolute: 0 cells/uL (ref 0–200)
EOS ABS: 207 {cells}/uL (ref 15–500)
Eosinophils Relative: 3 %
HEMATOCRIT: 38.7 % (ref 35.0–45.0)
HEMOGLOBIN: 12.7 g/dL (ref 11.7–15.5)
LYMPHS ABS: 2622 {cells}/uL (ref 850–3900)
LYMPHS PCT: 38 %
MCH: 28.9 pg (ref 27.0–33.0)
MCHC: 32.8 g/dL (ref 32.0–36.0)
MCV: 88.2 fL (ref 80.0–100.0)
MONO ABS: 759 {cells}/uL (ref 200–950)
MPV: 9.5 fL (ref 7.5–12.5)
Monocytes Relative: 11 %
NEUTROS PCT: 48 %
Neutro Abs: 3312 cells/uL (ref 1500–7800)
Platelets: 314 10*3/uL (ref 140–400)
RBC: 4.39 MIL/uL (ref 3.80–5.10)
RDW: 13.6 % (ref 11.0–15.0)
WBC: 6.9 10*3/uL (ref 3.8–10.8)

## 2016-03-12 LAB — COMPLETE METABOLIC PANEL WITH GFR
ALT: 18 U/L (ref 6–29)
AST: 19 U/L (ref 10–35)
Albumin: 4.4 g/dL (ref 3.6–5.1)
Alkaline Phosphatase: 50 U/L (ref 33–130)
BILIRUBIN TOTAL: 0.3 mg/dL (ref 0.2–1.2)
BUN: 10 mg/dL (ref 7–25)
CHLORIDE: 104 mmol/L (ref 98–110)
CO2: 27 mmol/L (ref 20–31)
CREATININE: 0.91 mg/dL (ref 0.50–0.99)
Calcium: 9.2 mg/dL (ref 8.6–10.4)
GFR, EST AFRICAN AMERICAN: 79 mL/min (ref >=60)
GFR, Est Non African American: 68 mL/min (ref >=60)
Glucose, Bld: 181 mg/dL — ABNORMAL HIGH (ref 65–99)
Potassium: 4.3 mmol/L (ref 3.5–5.3)
Sodium: 139 mmol/L (ref 135–146)
TOTAL PROTEIN: 7.8 g/dL (ref 6.1–8.1)

## 2016-03-12 LAB — TSH: TSH: 0.56 m[IU]/L

## 2016-03-12 LAB — POCT GLYCOSYLATED HEMOGLOBIN (HGB A1C): HEMOGLOBIN A1C: 6.5

## 2016-03-12 NOTE — Patient Instructions (Signed)
Thank you for coming in today, it was so nice to see you! Today we talked about:    Depression: We have ordered labs today to see if there is something that is causing your change in mood. If your symptoms worsen or you have thoughts of suicide/homicide, PLEASE SEEK IMMEDIATE MEDICAL ATTENTION.  You may always call the National Suicide Hotline. This is available 24 hours a day, 7 days a week.  Their number is: [phone].  Please follow up in 1 week. You can schedule this appointment at the front desk before you leave or call the clinic.  Bring in all your medications or supplements to each appointment for review.   If we ordered any tests today, you will be notified via telephone of any abnormalities. If everything is normal you will get a letter in the mail.   If you have any questions or concerns, please do not hesitate to call the office at [phone]. You can also message me directly via MyChart.   Sincerely,  Smitty Cords, MD

## 2016-03-12 NOTE — Assessment & Plan Note (Addendum)
New onset. Concerned as there is no inciting event to patient's acute change in mood and additionally she is having nausea, vomiting, appetite change, cold intolerance and headache. No suicidal or homicidal ideation. No visual or auditory hallucinations. Physical exam showing mild resting head tremor as well as resting bilateral hand tremor. Neuro exam showing possible shuffling gait but no cogwheel rigidity and she is alert and oriented 3. No goiter or exophthalmos. We'll need to rule out a medical disorder associated with depression. Differentials to underlying causes of depression include include hypothyroidism, anemia, inflammatory disorder, malignancy, cardiomyopathy, dementia (?early Parkinson's), medications. The plan is as follows:  - Ordered CBC, TSH, ESR, hemoglobin A1c -Follow-up in one week with PCP -Thoroughly review medications with PCP to see if any of these are causing her symptoms -Consider starting antidepressant medication if all labs are normal -Consider Moca testing for any signs of dementia -See AVS for red flag symptoms discussed with patient 
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
## 2016-03-13 LAB — SEDIMENTATION RATE: Sed Rate: 9 mm/hr (ref 0–30)

## 2016-03-21 NOTE — Progress Notes (Signed)
   Subjective:    Patient ID: Sandra Ryan , female   DOB: 1955/08/03 , 60 y.o..   MRN: [id number]  *[place] video interpreter used for this visit*  HPI  Sandra Ryan is here for  Chief Complaint  Patient presents with  . Follow-up   Depression: Patient was seen about 2 weeks ago for depression. This is her follow up. She continues to feel the same as before, continues to not feel happy. She feels more tired than usual at home. Her daughter is here with her today and states that her mother has been changing in that her speech is softer than before. She also looks like she's "scared" according to the daughter. She notes that her mother's hands are trembling. The daughter states that she has had change in her memory. Daughter states that the patient has been very forgetful. She will leave the oven on and forget about it. The patient lives alone but the daughter checks in with her frequently.   Review of Systems: Per HPI. All other systems reviewed and are negative.  Medications: reviewed and updated  Social Hx:  reports that she has quit smoking. She has never used smokeless tobacco.   Objective:   BP 130/64   Pulse 81   Temp 98 F (36.7 C) (Oral)   Ht 5' (1.524 m)   Wt 125 lb 6.4 oz (56.9 kg)   SpO2 98%   BMI 24.49 kg/m  Physical Exam  Gen: NAD, alert, cooperative with exam, well-appearing Psych: good insight, normal mood and affect, no suicidal or homicidal ideation   Assessment & Plan:  Depression Continues to endorse depression. PHQ9 score of 9 today. Has a passive death wish but not actively suicidal. No lab abnormalities that would cause depression. No signs of thyroid abnormalities with normal TSH. Still concerned for possible dementia vs dementia syndrome of depression (see my previous note). Attempted to do mini cog testing today but was difficult with interpreter; patient was able to recall 2 words in 3 word recall and was able to draw circle of the clock. Unable to label  clock as she does not know how to write numbers. Suspect performing Moca testing will be difficult with limitations in education. Reviewed medications with patient's daughter. Amitriptilyne was on patient's med list but she is not taking this. Removed this from med list. The plan is as follows: - Begin Zoloft 50 mg daily - Follow up with PCP in 2-4 weeks - Consider increasing SSRI dose if no change in mood in next 6 weeks - Consider referral to neurologist if there is suspicion of dementia still - Advised daughter to continue to frequently check ins on her mother or have mother stay at her daughter's house    Smitty Cords, MD [place], PGY-2

## 2016-03-22 ENCOUNTER — Encounter: Payer: Self-pay | Admitting: Family Medicine

## 2016-03-22 ENCOUNTER — Telehealth: Payer: Self-pay | Admitting: *Deleted

## 2016-03-22 ENCOUNTER — Ambulatory Visit: Payer: Medicaid Other | Admitting: Family Medicine

## 2016-03-22 ENCOUNTER — Ambulatory Visit (INDEPENDENT_AMBULATORY_CARE_PROVIDER_SITE_OTHER): Payer: Medicaid Other | Admitting: Family Medicine

## 2016-03-22 DIAGNOSIS — IMO0001 Reserved for inherently not codable concepts without codable children: Secondary | ICD-10-CM

## 2016-03-22 DIAGNOSIS — E1165 Type 2 diabetes mellitus with hyperglycemia: Principal | ICD-10-CM

## 2016-03-22 DIAGNOSIS — F329 Major depressive disorder, single episode, unspecified: Secondary | ICD-10-CM

## 2016-03-22 DIAGNOSIS — F32A Depression, unspecified: Secondary | ICD-10-CM

## 2016-03-22 MED ORDER — SERTRALINE HCL 50 MG PO TABS: 50.0000 mg | ORAL_TABLET | Freq: Every day | ORAL | 3 refills | Status: DC

## 2016-03-22 MED ORDER — ATORVASTATIN CALCIUM 80 MG PO TABS: 80.0000 mg | ORAL_TABLET | Freq: Every day | ORAL | 0 refills | Status: DC

## 2016-03-22 NOTE — Telephone Encounter (Signed)
Thank you for letting me know. Refill provided.

## 2016-03-22 NOTE — Patient Instructions (Addendum)
Thank you for coming in today, it was so nice to see you! Today we talked about:   Depression: We will need to start you on a medication called Zoloft. Please let me know if she's taking Amitriptyline. If she is not, I will send in a prescription for Zoloft.   Please follow up in 2-4 weeks with your primary care doctor Dr. Burr Medico or myself (Dr. Juanito Doom). You can schedule this appointment at the front desk before you leave or call the clinic.  If you have any questions or concerns, please do not hesitate to call the office at [phone]. You can also message me directly via MyChart.   Sincerely,  Smitty Cords, MD  Sertraline tablets What is this medicine? SERTRALINE (SER tra leen) is used to treat depression. It may also be used to treat obsessive compulsive disorder, panic disorder, post-trauma stress, premenstrual dysphoric disorder (PMDD) or social anxiety. This medicine may be used for other purposes; ask your health care provider or pharmacist if you have questions. COMMON BRAND NAME(S): Zoloft What should I tell my health care provider before I take this medicine? They need to know if you have any of these conditions: -bleeding disorders -bipolar disorder or a family history of bipolar disorder -glaucoma -heart disease -high blood pressure -history of irregular heartbeat -history of low levels of calcium, magnesium, or potassium in the blood -if you often drink alcohol -liver disease -receiving electroconvulsive therapy -seizures -suicidal thoughts, plans, or attempt; a previous suicide attempt by you or a family member -take medicines that treat or prevent blood clots -thyroid disease -an unusual or allergic reaction to sertraline, other medicines, foods, dyes, or preservatives -pregnant or trying to get pregnant -breast-feeding How should I use this medicine? Take this medicine by mouth with a glass of water. Follow the directions on the prescription label. You can  take it with or without food. Take your medicine at regular intervals. Do not take your medicine more often than directed. Do not stop taking this medicine suddenly except upon the advice of your doctor. Stopping this medicine too quickly may cause serious side effects or your condition may worsen. A special MedGuide will be given to you by the pharmacist with each prescription and refill. Be sure to read this information carefully each time. Talk to your pediatrician regarding the use of this medicine in children. While this drug may be prescribed for children as young as 7 years for selected conditions, precautions do apply. Overdosage: If you think you have taken too much of this medicine contact a poison control center or emergency room at once. NOTE: This medicine is only for you. Do not share this medicine with others. What if I miss a dose? If you miss a dose, take it as soon as you can. If it is almost time for your next dose, take only that dose. Do not take double or extra doses. What may interact with this medicine? Do not take this medicine with any of the following medications: -certain medicines for fungal infections like fluconazole, itraconazole, ketoconazole, posaconazole, voriconazole -cisapride -disulfiram -dofetilide -linezolid -MAOIs like Carbex, Eldepryl, Marplan, Nardil, and Parnate -metronidazole -methylene blue (injected into a vein) -pimozide -thioridazine -ziprasidone This medicine may also interact with the following medications: -alcohol -amphetamines -aspirin and aspirin-like medicines -certain medicines for depression, anxiety, or psychotic disturbances -certain medicines for irregular heart beat like flecainide, propafenone -certain medicines for migraine headaches like almotriptan, eletriptan, frovatriptan, naratriptan, rizatriptan, sumatriptan, zolmitriptan -certain medicines for sleep -certain medicines  for seizures like carbamazepine, valproic acid,  phenytoin -certain medicines that treat or prevent blood clots like warfarin, enoxaparin, dalteparin -cimetidine -digoxin -diuretics -fentanyl -furazolidone -isoniazid -lithium -NSAIDs, medicines for pain and inflammation, like ibuprofen or naproxen -other medicines that prolong the QT interval (cause an abnormal heart rhythm) -procarbazine -rasagiline -supplements like St. John's wort, kava kava, valerian -tolbutamide -tramadol -tryptophan This list may not describe all possible interactions. Give your health care provider a list of all the medicines, herbs, non-prescription drugs, or dietary supplements you use. Also tell them if you smoke, drink alcohol, or use illegal drugs. Some items may interact with your medicine. What should I watch for while using this medicine? Tell your doctor if your symptoms do not get better or if they get worse. Visit your doctor or health care professional for regular checks on your progress. Because it may take several weeks to see the full effects of this medicine, it is important to continue your treatment as prescribed by your doctor. Patients and their families should watch out for new or worsening thoughts of suicide or depression. Also watch out for sudden changes in feelings such as feeling anxious, agitated, panicky, irritable, hostile, aggressive, impulsive, severely restless, overly excited and hyperactive, or not being able to sleep. If this happens, especially at the beginning of treatment or after a change in dose, call your health care professional. Dennis Bast may get drowsy or dizzy. Do not drive, use machinery, or do anything that needs mental alertness until you know how this medicine affects you. Do not stand or sit up quickly, especially if you are an older patient. This reduces the risk of dizzy or fainting spells. Alcohol may interfere with the effect of this medicine. Avoid alcoholic drinks. Your mouth may get dry. Chewing sugarless gum or  sucking hard candy, and drinking plenty of water may help. Contact your doctor if the problem does not go away or is severe. What side effects may I notice from receiving this medicine? Side effects that you should report to your doctor or health care professional as soon as possible: -allergic reactions like skin rash, itching or hives, swelling of the face, lips, or tongue -anxious -black, tarry stools -changes in vision -confusion -elevated mood, decreased need for sleep, racing thoughts, impulsive behavior -eye pain -fast, irregular heartbeat -feeling faint or lightheaded, falls -feeling agitated, angry, or irritable -hallucination, loss of contact with reality -loss of balance or coordination -loss of memory -painful or prolonged erections -restlessness, pacing, inability to keep still -seizures -stiff muscles -suicidal thoughts or other mood changes -trouble sleeping -unusual bleeding or bruising -unusually weak or tired -vomiting Side effects that usually do not require medical attention (report to your doctor or health care professional if they continue or are bothersome): -change in appetite or weight -change in sex drive or performance -diarrhea -increased sweating -indigestion, nausea -tremors This list may not describe all possible side effects. Call your doctor for medical advice about side effects. You may report side effects to FDA at 1-800-FDA-1088. Where should I keep my medicine? Keep out of the reach of children. Store at room temperature between 15 and 30 degrees C (59 and 86 degrees F). Throw away any unused medicine after the expiration date. NOTE: This sheet is a summary. It may not cover all possible information. If you have questions about this medicine, talk to your doctor, pharmacist, or health care provider.  2017 Elsevier/Gold Standard (2015-08-12 15:54:02)

## 2016-03-22 NOTE — Telephone Encounter (Signed)
Daughter called and states that mother is not taking the elavil that they discussed while she was in the clinic today.  Also said that mom needs a refill on her cholesterol medication.  Will forward to Dr. Juanito Doom who saw her today. Jazmin Hartsell,CMA

## 2016-03-24 NOTE — Assessment & Plan Note (Addendum)
Continues to endorse depression. PHQ9 score of 9 today. Has a passive death wish but not actively suicidal. No lab abnormalities that would cause depression. No signs of thyroid abnormalities with normal TSH. Still concerned for possible dementia vs dementia syndrome of depression (see my previous note). Attempted to do mini cog testing today but was difficult with interpreter; patient was able to recall 2 words in 3 word recall and was able to draw circle of the clock. Unable to label clock as she does not know how to write numbers. Suspect performing Moca testing will be difficult with limitations in education. Reviewed medications with patient's daughter. Amitriptilyne was on patient's med list but she is not taking this. Removed this from med list. The plan is as follows: - Begin Zoloft 50 mg daily - Follow up with PCP in 2-4 weeks - Consider increasing SSRI dose if no change in mood in next 6 weeks - Consider referral to neurologist if there is suspicion of dementia still - Advised daughter to continue to frequently check ins on her mother or have mother stay at her daughter's house

## 2016-04-14 NOTE — Progress Notes (Deleted)
   CC: ***  HPI: Sandra Ryan is a 61 y.o. female with PMH significant for HTN, T2DM, diabetic neuropathy who was recently diagnosed with depression one month ago and presents to [facility] today for depression follow-up.   Depression: - Started on Zoloft 50 mg daily on 03/22/2016 - ***consider increasing SSRI dose, consider referral to neurologist if still suspicion of dementia  Overdue health maintenance: Ophthalmology exam, zostavax  Review of Symptoms:  See HPI for ROS.   CC, SH/smoking status, and VS noted.  Objective: There were no vitals taken for this visit. GEN: NAD, alert, cooperative, and pleasant.*** EYE: no conjunctival injection, pupils equally round and reactive to light ENMT: normal tympanic light reflex, no nasal polyps,no rhinorrhea, no pharyngeal erythema or exudates NECK: full ROM, no thyromegaly RESPIRATORY: clear to auscultation bilaterally with no wheezes, rhonchi or rales, good effort CV: RRR, no m/r/g, no peripheral edema GI: soft, non-tender, non-distended, normoactive bowel sounds, no hepatosplenomegaly SKIN: warm and dry, no rashes or lesions NEURO: II-XII grossly intact, normal gait, peripheral sensation intact PSYCH: AAOx3, appropriate affect  Flu Vaccine: *** Tdap Vaccine: *** - every 59yrs - (<3 lifetime doses or unknown): all wounds -- look up need for Tetanus IG - (>=3 lifetime doses): clean/minor wound if >74yrs from previous; all other wounds if >43yrs from previous Zoster Vaccine: *** (those >50yo, once) Pneumonia Vaccine: *** (those w/ risk factors) - (<28yr) Both: Immunocompromised, cochlear implant, CSF leak, asplenic, sickle cell, Chronic Renal Failure - (<82yr) PPSV-23 only: Heart dz, lung disease, DM, tobacco abuse, alcoholism, cirrhosis/liver disease. - (>25yr): PPSV13 then PPSV23 in 6-12mths;  - (>65yr): repeat PPSV23 once if pt received prior to 62yo and 59yrs have passed  Assessment and plan:  No problem-specific Assessment & Plan  notes found for this encounter.   No orders of the defined types were placed in this encounter.   No orders of the defined types were placed in this encounter.    Everrett Coombe, MD,MS,  PGY1 04/14/2016 7:32 PM

## 2016-04-15 ENCOUNTER — Ambulatory Visit: Payer: Medicaid Other | Admitting: Student in an Organized Health Care Education/Training Program

## 2016-04-15 NOTE — Progress Notes (Signed)
CC: Depression follow up  HPI: Sandra Ryan is a 61 y.o. female with PMH significant for HTN, T2DM, diabetic retinopathy who presents to [facility] today for depression follow up.   Depression - previously seen 03/22/16 for depression, PHQ9 at that time, started on Zoloft 50 mg daily - tolerated this medication well - PHQ9 today - unable to assess - Endorses decrease appetite, continued feelings of sadness, poor sleep, poor energy and decreased interest.  - States symptoms have improved but only slightly  Palpitations - worsening over 5-6 months, bother her intermittently -  Worse when she stands or sits, worse when she is very hungry - no clear palliating factors - dizziness when she gets out of bed in the morning - no recent falls - takes sitagliptin, glipizide, unsure if she is taking metformin. Takes some medications in morning and some in evening, unsure when she takes diabetes medications.  - Does not check sugars - Denies feelings of anxiety, abdominal pain, sweating, or light-headedness with palpitation episodes  - Endorses tremors in hands and feet with these episodes  Overdue health maintenance Eye exam, zostavax, foot exam will be due 04/2016  Review of Symptoms:  See HPI for ROS.   CC, SH/smoking status, and VS noted.  Objective: BP 130/78   Pulse 96   Temp 98.3 F (36.8 C) (Oral)   Wt 125 lb (56.7 kg)   SpO2 98%   BMI 24.41 kg/m  GEN: NAD, alert, cooperative, and pleasant. EYE: no conjunctival injection, pupils equally round and reactive to light NECK: full ROM, no thyromegaly RESPIRATORY: clear to auscultation bilaterally with no wheezes, rhonchi or rales, good effort CV: RRR, no m/r/g, no peripheral edema GI: soft, non-tender, non-distended, normoactive bowel sounds, no hepatosplenomegaly SKIN: warm and dry, no rashes or lesions NEURO: II-XII grossly intact, normal gait, peripheral sensation intact  Assessment and plan:  Depression - unable to assess PHQ9  today as patient unable to answer the questions appropriately with the language barrier - patient does endorse 5/8 SIGECAPS, no suicidal ideation, does feel zoloft has improved symptoms slightly - increase to 100 mg zoloft daily  - return precautions advised - will reassess in 2-4 weeks, can stay at this dose or increase if necessary  Palpitations May be due to low blood sugars given associated hand and feet tremors, episodes occur when she's "very hungry". - CBG WNL at 174 in office today after patient ate and took 2 diabetes medications this morning (likely glipizide and metformin). - Considered cardiac etiology. Reassuring that ECG NSR in the office today without abnormalities. No other cardiac red flags  - Considered thyroid etiology however TSH WNL one month ago - Glipizide can cause low sugars, will trial off this medication with close follow up - return precautions advised   Constipation - BM every 2-4 days - refilled colace   Orders Placed This Encounter  Procedures  . Glucose (CBG)  . EKG 12-Lead    Ordered by an unspecified provider     Meds ordered this encounter  Medications  . sertraline (ZOLOFT) 100 MG tablet    Sig: Take 1 tablet (100 mg total) by mouth daily.    Dispense:  30 tablet    Refill:  0  . docusate sodium (COLACE) 100 MG capsule    Sig: Take 1 capsule (100 mg total) by mouth 2 (two) times daily.    Dispense:  30 capsule    Refill:  0     Everrett Coombe, MD,MS,  PGY1  04/16/2016 5:08 PM

## 2016-04-16 ENCOUNTER — Ambulatory Visit (HOSPITAL_COMMUNITY)
Admission: RE | Admit: 2016-04-16 | Discharge: 2016-04-16 | Disposition: A | Discharge status: Home / Self Care | Payer: Medicaid Other | Source: Ambulatory Visit | Attending: Family Medicine | Admitting: Family Medicine

## 2016-04-16 ENCOUNTER — Ambulatory Visit (INDEPENDENT_AMBULATORY_CARE_PROVIDER_SITE_OTHER): Payer: Medicaid Other | Admitting: Student in an Organized Health Care Education/Training Program

## 2016-04-16 ENCOUNTER — Encounter: Payer: Self-pay | Admitting: Student in an Organized Health Care Education/Training Program

## 2016-04-16 VITALS — BP 130/78 | HR 96 | Temp 98.3°F | Wt 125.0 lb

## 2016-04-16 DIAGNOSIS — E1140 Type 2 diabetes mellitus with diabetic neuropathy, unspecified: Secondary | ICD-10-CM | POA: Diagnosis present

## 2016-04-16 DIAGNOSIS — F329 Major depressive disorder, single episode, unspecified: Secondary | ICD-10-CM

## 2016-04-16 DIAGNOSIS — F32A Depression, unspecified: Secondary | ICD-10-CM

## 2016-04-16 DIAGNOSIS — K5900 Constipation, unspecified: Secondary | ICD-10-CM | POA: Diagnosis not present

## 2016-04-16 DIAGNOSIS — R002 Palpitations: Secondary | ICD-10-CM | POA: Diagnosis not present

## 2016-04-16 LAB — GLUCOSE, POCT (MANUAL RESULT ENTRY): POC Glucose: 174 mg/dl — AB (ref 70–99)

## 2016-04-16 MED ORDER — DOCUSATE SODIUM 100 MG PO CAPS: 100.0000 mg | ORAL_CAPSULE | Freq: Two times a day (BID) | ORAL | 0 refills | Status: DC

## 2016-04-16 MED ORDER — SERTRALINE HCL 100 MG PO TABS: 100.0000 mg | ORAL_TABLET | Freq: Every day | ORAL | 0 refills | Status: DC

## 2016-04-16 NOTE — Assessment & Plan Note (Signed)
-   unable to assess PHQ9 today as patient unable to answer the questions appropriately with the language barrier - patient does endorse 5/8 SIGECAPS, no suicidal ideation, does feel zoloft has improved symptoms slightly - increase to 100 mg zoloft daily  - return precautions advised - will reassess in 2-4 weeks, can stay at this dose or increase if necessary

## 2016-04-16 NOTE — Patient Instructions (Signed)
It was a pleasure seeing you today in our clinic. Here is the treatment plan we have discussed and agreed upon together:  Depression - I increased the dose of your Zoloft medication. Please discontinue the medication you have at home and start the medication I sent to the pharmacy.  Heart Racing - Your EKG was normal - This may be caused by low blood sugars. Please stop taking glipizide medication.  Constipation - I refilled and sent colace to your pharmacy.  Follow up: Please schedule an appointment to come back in to discuss Depression and your heart rate again in 2-4 weeks.  Our clinic's number is [phone]. Please call with questions or concerns about what we discussed today.  Be well, Dr. Burr Medico

## 2016-04-16 NOTE — Assessment & Plan Note (Addendum)
May be due to low blood sugars given associated hand and feet tremors, episodes occur when she's "very hungry". - CBG WNL at 174 in office today after patient ate and took 2 diabetes medications this morning (likely glipizide and metformin). - Considered cardiac etiology. Reassuring that ECG NSR in the office today without abnormalities. No other cardiac red flags  - Considered thyroid etiology however TSH WNL one month ago - Glipizide can cause low sugars, will trial off this medication with close follow up - return precautions advised

## 2016-04-16 NOTE — Assessment & Plan Note (Signed)
-   BM every 2-4 days - refilled colace

## 2016-05-14 ENCOUNTER — Ambulatory Visit (INDEPENDENT_AMBULATORY_CARE_PROVIDER_SITE_OTHER): Payer: Medicaid Other | Admitting: Student in an Organized Health Care Education/Training Program

## 2016-05-14 ENCOUNTER — Encounter: Payer: Self-pay | Admitting: Student in an Organized Health Care Education/Training Program

## 2016-05-14 DIAGNOSIS — H538 Other visual disturbances: Secondary | ICD-10-CM

## 2016-05-14 DIAGNOSIS — F329 Major depressive disorder, single episode, unspecified: Secondary | ICD-10-CM

## 2016-05-14 DIAGNOSIS — G4700 Insomnia, unspecified: Secondary | ICD-10-CM | POA: Insufficient documentation

## 2016-05-14 DIAGNOSIS — F32A Depression, unspecified: Secondary | ICD-10-CM

## 2016-05-14 MED ORDER — TRAZODONE HCL 50 MG PO TABS: 25.0000 mg | ORAL_TABLET | Freq: Every evening | ORAL | 2 refills | Status: DC | PRN

## 2016-05-14 NOTE — Assessment & Plan Note (Signed)
-   Discussed sleep hygiene with the patient - We will start trazodone 25 mg nightly as needed for sleep - Can consider increasing as needed

## 2016-05-14 NOTE — Patient Instructions (Signed)
It was a pleasure seeing you today in our clinic. Today we discussed insomnia and depression. Here is the treatment plan we have discussed and agreed upon together:  - Please continue to take your Zoloft every night. - Please take the new medication I prescribed, Trazodone as needed for sleep at night. This medication may make you drowsy, so be careful not to take it before driving a car.   Our clinic's number is [phone]. Please call with questions or concerns about what we discussed today.  Be well, Dr. Burr Medico

## 2016-05-14 NOTE — Assessment & Plan Note (Signed)
-   Continue Zoloft 100 mg nightly at bedtime - Continue to monitor depression symptoms - No red flags at this visit - Consider going up on Zoloft to 150 mg nightly at bedtime in order to help with symptoms of insomnia, however after discussing with the patient decision was made to initiate a new medication, trazodone for sleep

## 2016-05-14 NOTE — Progress Notes (Signed)
   CC: Insomnia  HPI: Sandra Ryan is a 61 y.o. female.  Depression - Previously saw the patient after she was endorsing feelings of depression, decreased concentration, poor sleep. Denies suicidal/homicidal ideation. Patient does not do well with pHQ9 through the translator, however subjectively does feel her symptoms have improved - She continues to take Zoloft 100 mg, qhs which was titrated up at last visit - reports 100% compliance with zoloft - Patient feels her feelings of depression have improved on Zoloft  Insomnia - Goes to bed at 9 PM, falls to sleep at 12 PM at wakes at 3 AM, lies on bed, then wakes at 6 AM. Just lies in bed waiting to fall back to sleep. On average at about 3 hours of sleep per night - no television in bedroom - no caffeine, no alcohol - No naps during the day - Does endorse walking around outside to exercise - life stressors include her husband passed away 2 years ago - She reports that her daily functioning affected by inability to sleep  Vision - watery eyes, blurry vision with waterying - bothers her most when she watches TV - Used to use bifocal lenses however she lost her glasses - last saw an eye doctor 5-6 months ago - Now February 28 has an appointment with the eye doctor  Review of Symptoms:  See HPI for ROS.   CC, SH/smoking status, and VS noted.  Objective: BP (!) 108/58   Pulse 88   Temp 98.2 F (36.8 C) (Oral)   Ht 5' (1.524 m)   Wt 126 lb 9.6 oz (57.4 kg)   SpO2 97%   BMI 24.72 kg/m  GEN: NAD, alert, cooperative, and pleasant. RESPIRATORY: clear to auscultation bilaterally with no wheezes, rhonchi or rales, good effort CV: RRR, no m/r/g, no peripheral edema GI: soft, non-tender, non-distended SKIN: warm and dry, no rashes or lesions NEURO: II-XII grossly intact, normal gait, peripheral sensation intact PSYCH: AAOx3, appropriate affect  Assessment and plan:  Depression - Continue Zoloft 100 mg nightly at bedtime -  Continue to monitor depression symptoms - No red flags at this visit - Consider going up on Zoloft to 150 mg nightly at bedtime in order to help with symptoms of insomnia, however after discussing with the patient decision was made to initiate a new medication, trazodone for sleep  Blurry vision - Worse when watching TV, patient has not had access to her glasses recently - She has follow-up with an eye doctor scheduled in 8 days  Insomnia - Discussed sleep hygiene with the patient - We will start trazodone 25 mg nightly as needed for sleep - Can consider increasing as needed    No orders of the defined types were placed in this encounter.   Meds ordered this encounter  Medications  . traZODone (DESYREL) 50 MG tablet    Sig: Take 0.5 tablets (25 mg total) by mouth at bedtime as needed for sleep.    Dispense:  15 tablet    Refill:  2    Please cut the pills in half for the patient.     Everrett Coombe, MD,MS,  PGY1 05/14/2016 4:48 PM

## 2016-05-14 NOTE — Assessment & Plan Note (Signed)
-   Worse when watching TV, patient has not had access to her glasses recently - She has follow-up with an eye doctor scheduled in 8 days

## 2016-08-12 ENCOUNTER — Other Ambulatory Visit: Payer: Self-pay | Admitting: Student in an Organized Health Care Education/Training Program

## 2016-09-18 ENCOUNTER — Ambulatory Visit (INDEPENDENT_AMBULATORY_CARE_PROVIDER_SITE_OTHER): Payer: Medicaid Other | Admitting: Student in an Organized Health Care Education/Training Program

## 2016-09-18 ENCOUNTER — Encounter: Payer: Self-pay | Admitting: Student in an Organized Health Care Education/Training Program

## 2016-09-18 VITALS — BP 132/81 | HR 94 | Temp 98.7°F | Wt 132.0 lb

## 2016-09-18 DIAGNOSIS — E118 Type 2 diabetes mellitus with unspecified complications: Secondary | ICD-10-CM

## 2016-09-18 DIAGNOSIS — E1165 Type 2 diabetes mellitus with hyperglycemia: Secondary | ICD-10-CM | POA: Diagnosis not present

## 2016-09-18 DIAGNOSIS — E1139 Type 2 diabetes mellitus with other diabetic ophthalmic complication: Secondary | ICD-10-CM

## 2016-09-18 DIAGNOSIS — M159 Polyosteoarthritis, unspecified: Secondary | ICD-10-CM | POA: Diagnosis not present

## 2016-09-18 DIAGNOSIS — M2550 Pain in unspecified joint: Secondary | ICD-10-CM

## 2016-09-18 DIAGNOSIS — R079 Chest pain, unspecified: Secondary | ICD-10-CM

## 2016-09-18 LAB — POCT GLYCOSYLATED HEMOGLOBIN (HGB A1C): HEMOGLOBIN A1C: 7

## 2016-09-18 MED ORDER — NAPROXEN 500 MG PO TABS: 500.0000 mg | ORAL_TABLET | Freq: Every day | ORAL | 0 refills | Status: DC | PRN

## 2016-09-18 NOTE — Progress Notes (Signed)
   CC: joint pain, generalized  HPI: Sandra Ryan is a 61 y.o. female with PMH significant for T2DM,  HTN, osteoarthritis who presents to [facility] today with joint pain of 3 weeks duration. Interview with assistance of [employer] ([place]ali)   Generalized joint pain -  Achy non-radiating pain noted bilaterally elbows, shoulders and knees, wrists and ankles - patient reports she was previously prescribed naproxen which she feels helped with the symptoms. She ran out of naproxen a while ago and the pain gradually came back since stopping this medication - pain is worse with activity, worse when she first stands from sitting, worse with movement -   Review of Symptoms:  See HPI for ROS.   CC, SH/smoking status, and VS noted.  Objective: BP 132/81   Pulse 94   Temp 98.7 F (37.1 C) (Oral)   Wt 132 lb (59.9 kg)   SpO2 96%   BMI 25.78 kg/m  GEN: NAD, alert, cooperative, and pleasant. MSK:  - Shoulders bilaterally: no pain with passive movement, full ROM, no tenderness to palpation - wrists bilat: no pain with passive movement, no edema or effusions, full ROM, no tenderness to palpation - Knees: full ROM no ttp - Ankles: full ROM, no ttp SKIN: warm and dry, no rashes or lesions NEURO: II-XII grossly intact, normal gait, peripheral sensation intact PSYCH: AAOx3, appropriate affect  Assessment and plan:  Osteoarthritis - multiple bil joints affected - patient to continue doing home exercises - naproxen refilled - patient instructed not to use OTC NSAIDs with this medication - if patient requires this med chronically, plan to monitor at least q3 months  Diabetes mellitus type II, uncontrolled - A1c drawn as patient left this visit - A1c 7.0 from 6.5 - follow up appt was made for diabetes management prior to patient leaving the office   Orders Placed This Encounter  Procedures  . POCT glycosylated hemoglobin (Hb A1C)    Meds ordered this encounter  Medications  .  naproxen (NAPROSYN) 500 MG tablet    Sig: Take 1 tablet (500 mg total) by mouth daily as needed for mild pain or moderate pain (take with a meal.).    Dispense:  90 tablet    Refill:  0     Everrett Coombe, MD,MS,  PGY1 09/21/2016 9:42 AM

## 2016-09-18 NOTE — Patient Instructions (Addendum)
It was a pleasure seeing you today in our clinic. Today we discussed your joint pain. Here is the treatment plan we have discussed and agreed upon together:  - I refilled your medication at your pharmacy. Please take this as needed for joint pain - please continue your exercises and stretches at home - do not take ibuprofen over the counter with naproxen as this can be bad for your kidneys and blood  - please schedule a follow up visit for your chronic medical problems and diabetes management.  Our clinic's number is [phone]. Please call with questions or concerns about what we discussed today.  Be well, Dr. Burr Medico

## 2016-09-19 DIAGNOSIS — M2550 Pain in unspecified joint: Secondary | ICD-10-CM | POA: Insufficient documentation

## 2016-09-21 NOTE — Assessment & Plan Note (Signed)
-   multiple bil joints affected - patient to continue doing home exercises - naproxen refilled - patient instructed not to use OTC NSAIDs with this medication - if patient requires this med chronically, plan to monitor at least q3 months

## 2016-09-21 NOTE — Assessment & Plan Note (Signed)
-   A1c drawn as patient left this visit - A1c 7.0 from 6.5 - follow up appt was made for diabetes management prior to patient leaving the office

## 2016-09-24 ENCOUNTER — Encounter: Payer: Self-pay | Admitting: Student in an Organized Health Care Education/Training Program

## 2016-09-24 ENCOUNTER — Ambulatory Visit (INDEPENDENT_AMBULATORY_CARE_PROVIDER_SITE_OTHER): Payer: Medicaid Other | Admitting: Student in an Organized Health Care Education/Training Program

## 2016-09-24 VITALS — BP 124/76 | HR 87 | Temp 98.7°F | Ht 60.0 in | Wt 134.0 lb

## 2016-09-24 DIAGNOSIS — E7800 Pure hypercholesterolemia, unspecified: Secondary | ICD-10-CM | POA: Diagnosis not present

## 2016-09-24 DIAGNOSIS — IMO0001 Reserved for inherently not codable concepts without codable children: Secondary | ICD-10-CM

## 2016-09-24 DIAGNOSIS — E1165 Type 2 diabetes mellitus with hyperglycemia: Secondary | ICD-10-CM | POA: Diagnosis not present

## 2016-09-24 MED ORDER — SITAGLIPTIN PHOSPHATE 50 MG PO TABS: 50.0000 mg | ORAL_TABLET | Freq: Every day | ORAL | 2 refills | Status: DC

## 2016-09-24 MED ORDER — ATORVASTATIN CALCIUM 80 MG PO TABS: 80.0000 mg | ORAL_TABLET | Freq: Every day | ORAL | 0 refills | Status: DC

## 2016-09-24 NOTE — Progress Notes (Signed)
   CC: Diabetes follow up  HPI: Sandra Ryan is a 61 y.o. female with PMH significant for T2DM, peripheral neuropathy, GERD, osteoarthritis who presents to [facility] today for diabetes follow up.  DIABETES Disease Monitoring: HgbAqc 7.0 from 6.5 in 02/2016 Blood Sugar ranges-does not check  Polyuria/phagia/dipsia- gets up 2x nightly to urinate       Visual problems- follows with eye doctor, last went 2 montsh ago Medications: Metformin 1000 mg BID which she endorses taking as prescribed. Januvia 50 mg Qd which she forgot she was supposed to be taking, has not refilled and cannot remember the last time she took it  Hypoglycemic symptoms- denies  HYPERLIPIDEMIA Disease Monitoring: See symptoms for Hypertension Medications: Atorvastatin 80 mg daily, endorses taking as prescribed, requests refi  Right upper quadrant pain- denies   Muscle aches- denies  Monitoring Labs and Parameters Last A1C:  Lab Results  Component Value Date   HGBA1C 7.0 09/18/2016    Last Lipid:     Component Value Date/Time   CHOL 222 (H) 05/04/2015 1057   HDL 47 05/04/2015 1057   LDLDIRECT 139 (H) 01/31/2012 1634    Last Bmet  Potassium  Date Value Ref Range Status  03/12/2016 4.3 3.5 - 5.3 mmol/L Final   Sodium  Date Value Ref Range Status  03/12/2016 139 135 - 146 mmol/L Final   Creat  Date Value Ref Range Status  03/12/2016 0.91 0.50 - 0.99 mg/dL Final    Comment:      For patients > or = 62 years of age: The upper reference limit for Creatinine is approximately 13% higher for people identified as African-American.         Last BPs:  BP Readings from Last 3 Encounters:  09/24/16 124/76  09/18/16 132/81  05/14/16 (!) 108/58     Review of Symptoms:  See HPI for ROS.   CC, SH/smoking status, and VS noted.  Objective: BP 124/76   Pulse 87   Temp 98.7 F (37.1 C) (Oral)   Ht 5' (1.524 m)   Wt 60.8 kg (134 lb)   BMI 26.17 kg/m  GEN: NAD, alert, cooperative, and  pleasant. RESPIRATORY: clear to auscultation bilaterally with no wheezes, rhonchi or rales, good effort CV: RRR, no m/r/g, no peripheral edema GI: soft, non-tender, non-distended SKIN: warm and dry, no rashes or lesions NEURO: II-XII grossly intact, normal gait, peripheral sensation intact PSYCH: AAOx3, appropriate affect  Foot exam completed today.  Assessment and plan:  No problem-specific Assessment & Plan notes found for this encounter.   No orders of the defined types were placed in this encounter.   No orders of the defined types were placed in this encounter.    Everrett Coombe, MD,MS,  PGY1 09/24/2016 3:39 PM

## 2016-09-24 NOTE — Progress Notes (Signed)
   CC: Diabets follow up  HPI: Sandra Ryan is a 61 y.o. female presenting for diabetes follow up. PMHx includes T2DM, HTN, HLD, osteoarthritis.   DIABETES Disease Monitoring: Hgb A1c 7.0 from 6.5 in 02/2016  Polyuria/phagia/dipsia- endorses nocturia 2x nightly  Visual problems- denies Medications: Januvia 50 mg qd >> cannot remember taking this medication, forgot about it, has been off it for months. Does endorse atking metofrmin 1000 mg BID Hypoglycemic symptoms- Denies  HYPERLIPIDEMIA Disease Monitoring: See symptoms for Hypertension Medications: Atorvastatin 80 Compliance- out of this medication  Right upper quadrant pain- none   Muscle aches- none  Monitoring Labs and Parameters Last A1C:  Lab Results  Component Value Date   HGBA1C 7.0 09/18/2016    Last Lipid:     Component Value Date/Time   CHOL 222 (H) 05/04/2015 1057   HDL 47 05/04/2015 1057   LDLDIRECT 139 (H) 01/31/2012 1634    Last Bmet  Potassium  Date Value Ref Range Status  03/12/2016 4.3 3.5 - 5.3 mmol/L Final   Sodium  Date Value Ref Range Status  03/12/2016 139 135 - 146 mmol/L Final   Creat  Date Value Ref Range Status  03/12/2016 0.91 0.50 - 0.99 mg/dL Final    Comment:      For patients > or = 62 years of age: The upper reference limit for Creatinine is approximately 13% higher for people identified as African-American.         Last BPs:  BP Readings from Last 3 Encounters:  09/24/16 124/76  09/18/16 132/81  05/14/16 (!) 108/58     Review of Symptoms:  See HPI for ROS.   CC, SH/smoking status, and VS noted.  Objective: BP 124/76   Pulse 87   Temp 98.7 F (37.1 C) (Oral)   Ht 5' (1.524 m)   Wt 134 lb (60.8 kg)   BMI 26.17 kg/m  GEN: NAD, alert, cooperative, and pleasant. RESPIRATORY: clear to auscultation bilaterally with no wheezes, rhonchi or rales, good effort CV: RRR, no m/r/g, no peripheral edema SKIN: warm and dry, no rashes or lesions NEURO: II-XII grossly  intact, normal gait, peripheral sensation intact PSYCH: AAOx3, appropriate affect   Assessment and plan:  Diabetes mellitus type II, uncontrolled - continue metformin 1000 BID - restart januvia 50 mg daily - has been off for months, cannot remember last time she took - next HbA1c in 3 months - continue frequent med rec, patient sometimes forgets medications and stops taking them - may benefit from pharmacy referral in future  Hypercholesteremia - continue Atorva 80 mg daily    Meds ordered this encounter  Medications  . atorvastatin (LIPITOR) 80 MG tablet    Sig: Take 1 tablet (80 mg total) by mouth daily.    Dispense:  90 tablet    Refill:  0    Directions in Nepali if possible  . sitaGLIPtin (JANUVIA) 50 MG tablet    Sig: Take 1 tablet (50 mg total) by mouth daily.    Dispense:  30 tablet    Refill:  2     Everrett Coombe, MD,MS,  PGY2 09/24/2016 6:08 PM

## 2016-09-24 NOTE — Assessment & Plan Note (Addendum)
-   continue metformin 1000 BID - restart januvia 50 mg daily - has been off for months, cannot remember last time she took - next HbA1c in 3 months - continue frequent med rec, patient sometimes forgets medications and stops taking them - may benefit from pharmacy referral in future

## 2016-09-24 NOTE — Assessment & Plan Note (Signed)
-   continue Atorva 80 mg daily

## 2016-09-24 NOTE — Patient Instructions (Signed)
It was a pleasure seeing you today in our clinic. Today we discussed diabetes. Here is the treatment plan we have discussed and agreed upon together:   Diabetes Your diabetes could be better controlled. Your HbA1c is 7.0 Your goal is to have an A1c < 6.5 Medicine Changes: Restart Januvia once daily. You should continue to take metformin twice daily as you have been.  I sent Januvia and your cholesterol medication to your pharmacy. These are long term medications so if you run out of refills please ask the pharmacy to send a refill request to our office.  Come back to see [place] in: 3 months  Our clinic's number is [phone]. Please call with questions or concerns about what we discussed today.  Be well, Dr. Burr Medico

## 2016-11-15 ENCOUNTER — Other Ambulatory Visit: Payer: Self-pay | Admitting: Student in an Organized Health Care Education/Training Program

## 2016-11-25 NOTE — Progress Notes (Signed)
   CC: Right hip pain  HPI: Sandra Ryan is a 61 y.o. female with PMH significant for osteoarthritis who presents to [facility] today with right hip pain of 3 months duration.  Patient thought she had a diabetes check today but is not due for another month so would like to speak about hip pain. Nepali interpreter was used through [employer].  Pain in right hip Started 3 months ago, intitally with a little bit of pain that has been gradually worsening. Feels like a burning pain that does not radiate. Denies trauma. Pain is worst at night when she lies on that side or when sitting for a long time. Pain is better with walking. Has not taken any medications to make it better, however has been prescribed naproxen in the past for osteoarthritic pain which she takes as needed for small joint pain.  Denies fevers. Denies sensory changes, denies problems with gait.  No dysuria, urinary frequency or urgency. No N/V/D.  Review of Symptoms:  See HPI for ROS.   CC, SH/smoking status, and VS noted.  Objective: BP 112/62   Pulse 86   Temp 98.5 F (36.9 C) (Oral)   Ht 5' (1.524 m)   Wt 129 lb 9.6 oz (58.8 kg)   SpO2 98%   BMI 25.31 kg/m  GEN: NAD, alert, cooperative, and pleasant. ABD: soft, nt, nd MSK: +tenderness over right greater trochanter. No pain bilaterally with internal or external rotation of the leg when flexed at 90 degrees. No back ttp. Neuro: 5/5 strength LE bilaterally, sensation intact, normal gait  Assessment and plan:  Greater trochanteric bursitis, right Ddx also includes osteoarthritis of the hip, however history of pain when lying on that side as well as physical exam more consistent with greater trochanteric bursitis.  - tylenol TID - heating pad - return precautions provided - no red flags - return in 1 month or sooner if symptoms worsen or fail to improve   Meds ordered this encounter  Medications  . acetaminophen (TYLENOL) 500 MG tablet    Sig: Take 1 tablet  (500 mg total) by mouth 3 (three) times daily.    Dispense:  30 tablet    Refill:  0    Everrett Coombe, MD,MS,  PGY2 11/26/2016 1:27 PM

## 2016-11-26 ENCOUNTER — Ambulatory Visit (INDEPENDENT_AMBULATORY_CARE_PROVIDER_SITE_OTHER): Payer: Medicaid Other | Admitting: Student in an Organized Health Care Education/Training Program

## 2016-11-26 ENCOUNTER — Encounter: Payer: Self-pay | Admitting: Student in an Organized Health Care Education/Training Program

## 2016-11-26 VITALS — BP 112/62 | HR 86 | Temp 98.5°F | Ht 60.0 in | Wt 129.6 lb

## 2016-11-26 DIAGNOSIS — M7061 Trochanteric bursitis, right hip: Secondary | ICD-10-CM | POA: Diagnosis not present

## 2016-11-26 MED ORDER — ACETAMINOPHEN 500 MG PO TABS: 500.0000 mg | ORAL_TABLET | Freq: Three times a day (TID) | ORAL | 0 refills | Status: DC

## 2016-11-26 NOTE — Assessment & Plan Note (Addendum)
Ddx also includes osteoarthritis of the hip, however history of pain when lying on that side as well as physical exam more consistent with greater trochanteric bursitis.  - tylenol TID - heating pad - return precautions provided - no red flags - return in 1 month or sooner if symptoms worsen or fail to improve

## 2016-11-26 NOTE — Assessment & Plan Note (Deleted)
Multiple joints affected, however today c/o 3 months of pain in right greater trochanteric region. - trial tylenol 500 mg TID - recommend close follow up - can DC naproxen she is using PRN joint pain - can consider greater trochanteric steroid injection in future if pain persists

## 2016-11-26 NOTE — Patient Instructions (Addendum)
It was a pleasure seeing you today in our clinic. Today we discussed your hip pain. Here is the treatment plan we have discussed and agreed upon together:  - Please take Tylenol 3 times daily, with breakfast lunch and dinner - I sent a prescription for Tylenol to your pharmacy - Please schedule a follow-up appointment to be seen in one month as you leave the office today  Our clinic's number is [phone]. Please call with questions or concerns about what we discussed today.  Be well, Dr. Burr Medico

## 2016-12-25 ENCOUNTER — Ambulatory Visit (INDEPENDENT_AMBULATORY_CARE_PROVIDER_SITE_OTHER): Payer: Medicaid Other | Admitting: Student in an Organized Health Care Education/Training Program

## 2016-12-25 ENCOUNTER — Encounter: Payer: Self-pay | Admitting: Student in an Organized Health Care Education/Training Program

## 2016-12-25 VITALS — BP 116/84 | HR 112 | Temp 98.2°F | Ht 60.0 in | Wt 131.6 lb

## 2016-12-25 DIAGNOSIS — E118 Type 2 diabetes mellitus with unspecified complications: Secondary | ICD-10-CM | POA: Diagnosis not present

## 2016-12-25 DIAGNOSIS — E119 Type 2 diabetes mellitus without complications: Secondary | ICD-10-CM | POA: Insufficient documentation

## 2016-12-25 DIAGNOSIS — IMO0001 Reserved for inherently not codable concepts without codable children: Secondary | ICD-10-CM

## 2016-12-25 DIAGNOSIS — E1165 Type 2 diabetes mellitus with hyperglycemia: Secondary | ICD-10-CM | POA: Diagnosis not present

## 2016-12-25 LAB — POCT GLYCOSYLATED HEMOGLOBIN (HGB A1C): Hemoglobin A1C: 8

## 2016-12-25 MED ORDER — SITAGLIPTIN PHOSPHATE 50 MG PO TABS: 50.0000 mg | ORAL_TABLET | Freq: Every day | ORAL | 0 refills | Status: DC

## 2016-12-25 MED ORDER — METFORMIN HCL 1000 MG PO TABS: 1000.0000 mg | ORAL_TABLET | Freq: Two times a day (BID) | ORAL | 6 refills | Status: DC

## 2016-12-25 MED ORDER — ACCU-CHEK AVIVA PLUS W/DEVICE KIT: 1.0000 | PACK | Freq: Once | 0 refills | Status: AC

## 2016-12-25 NOTE — Assessment & Plan Note (Signed)
-   A1c worsened today at 8 from 7.0. Unclear compliance with meds, patient states she usually takes her medication however sometimes forgets she took it and takes it again. Because of this I do not think she'd be a good candidate for insulin. - Goal of A1c of 8.0 - continue metformin and [place] - ophtho appt scheduled - discussed dietary changes, patient agreeable to dietitian consult - follow up in 3 months for next A1c

## 2016-12-25 NOTE — Patient Instructions (Addendum)
It was a pleasure seeing you today in our clinic. Today we discussed your diabetes. Here is the treatment plan we have discussed and agreed upon together:  Diabetes Your diabetes A1c was 8.0 Your goal is to have an A1c < 8.0 Medicine Changes: No changes Homework: start exercising and work on your diet! Call us if: blood sugar   Come back to see [place] in: 3 month  Our clinic's number is [phone]. Please call with questions or concerns about what we discussed today.  Be well, Dr. Burr Medico

## 2016-12-25 NOTE — Progress Notes (Signed)
   CC: Diabetes follow up  HPI: Sandra Ryan is a 61 y.o. female with PMH significant for diabetes who presents to [facility] today for diabetes follow up  DIABETES Disease Monitoring: A1c 8.0 From 7.0 when last checked 3 months ago Blood Sugar ranges- Has not been checking, device broken  Polyuria/phagia/dipsia- dry mouth, otherwise denies       Visual problems- Denies, other than dry itchy eyes. Does follow with ophthalmology and has appointment on 10/8 Medications: Metformin 1000 mg BID, Januiva Compliance- Reports decent compliance, feels she remembers to take medication better when it is ordered in 90 day supply (will order this way). Reports she sometimes forgets she took her medication and takes it again that day.  Hypoglycemic symptoms- Endorses sometimes feeling shaky when she does not eat on time   Monitoring Labs and Parameters Last A1C:  Lab Results  Component Value Date   HGBA1C 8.0 12/25/2016   Last BPs:  BP Readings from Last 3 Encounters:  12/25/16 116/84  11/26/16 112/62  09/24/16 124/76    Review of Symptoms:  See HPI for ROS.   CC, SH/smoking status, and VS noted.  Objective: BP 116/84   Pulse (!) 112   Temp 98.2 F (36.8 C) (Oral)   Ht 5' (1.524 m)   Wt 131 lb 9.6 oz (59.7 kg)   SpO2 96%   BMI 25.70 kg/m  GEN: NAD, alert, cooperative, and pleasant. RESPIRATORY: comfortable work of breathing CV: RRR GI: soft, nondistended SKIN: warm and dry, no rashes or lesions  Flu Vaccine: Agrees to get today  Assessment and plan:  Type 2 diabetes mellitus with complication (HCC) - O9G worsened today at 8 from 7.0. Unclear compliance with meds, patient states she usually takes her medication however sometimes forgets she took it and takes it again. Because of this I do not think she'd be a good candidate for insulin. - Goal of A1c of 8.0 - continue metformin and [place] - ophtho appt scheduled - discussed dietary changes, patient agreeable to dietitian  consult - follow up in 3 months for next A1c   Orders Placed This Encounter  Procedures  . Consult to dietitian  . POCT glycosylated hemoglobin (Hb A1C)    Associate with Z13.1    Meds ordered this encounter  Medications  . sitaGLIPtin (JANUVIA) 50 MG tablet    Sig: Take 1 tablet (50 mg total) by mouth daily.    Dispense:  90 tablet    Refill:  0  . Blood Glucose Monitoring Suppl (ACCU-CHEK AVIVA PLUS) w/Device KIT    Sig: 1 kit by Does not apply route once.    Dispense:  1 kit    Refill:  0  . metFORMIN (GLUCOPHAGE) 1000 MG tablet    Sig: Take 1 tablet (1,000 mg total) by mouth 2 (two) times daily with a meal.    Dispense:  180 tablet    Refill:  6    Directions in Nepali if possible    Everrett Coombe, MD,MS,  PGY2 12/25/2016 7:38 PM

## 2016-12-30 ENCOUNTER — Other Ambulatory Visit: Payer: Self-pay | Admitting: Family Medicine

## 2016-12-30 DIAGNOSIS — E1165 Type 2 diabetes mellitus with hyperglycemia: Secondary | ICD-10-CM

## 2016-12-30 DIAGNOSIS — K219 Gastro-esophageal reflux disease without esophagitis: Secondary | ICD-10-CM

## 2016-12-30 DIAGNOSIS — IMO0001 Reserved for inherently not codable concepts without codable children: Secondary | ICD-10-CM

## 2017-02-18 ENCOUNTER — Other Ambulatory Visit: Payer: Self-pay | Admitting: Student in an Organized Health Care Education/Training Program

## 2017-03-04 ENCOUNTER — Ambulatory Visit: Payer: Medicaid Other | Admitting: Student in an Organized Health Care Education/Training Program

## 2017-03-13 ENCOUNTER — Ambulatory Visit: Payer: Medicaid Other | Admitting: Student in an Organized Health Care Education/Training Program

## 2017-03-13 ENCOUNTER — Other Ambulatory Visit: Payer: Self-pay

## 2017-03-13 ENCOUNTER — Encounter: Payer: Self-pay | Admitting: Student in an Organized Health Care Education/Training Program

## 2017-03-13 DIAGNOSIS — E118 Type 2 diabetes mellitus with unspecified complications: Secondary | ICD-10-CM | POA: Diagnosis not present

## 2017-03-13 DIAGNOSIS — IMO0001 Reserved for inherently not codable concepts without codable children: Secondary | ICD-10-CM

## 2017-03-13 DIAGNOSIS — E1165 Type 2 diabetes mellitus with hyperglycemia: Secondary | ICD-10-CM | POA: Diagnosis not present

## 2017-03-13 DIAGNOSIS — Z23 Encounter for immunization: Secondary | ICD-10-CM

## 2017-03-13 MED ORDER — SITAGLIPTIN PHOSPHATE 100 MG PO TABS: 100.0000 mg | ORAL_TABLET | Freq: Every day | ORAL | 3 refills | Status: DC

## 2017-03-13 MED ORDER — GLUCOSE BLOOD VI STRP: ORAL_STRIP | 12 refills | Status: DC

## 2017-03-13 MED ORDER — METFORMIN HCL 1000 MG PO TABS: 1000.0000 mg | ORAL_TABLET | Freq: Two times a day (BID) | ORAL | 6 refills | Status: DC

## 2017-03-13 NOTE — Patient Instructions (Signed)
It was a pleasure seeing you today in our clinic. Today we discussed your diabetes. Here is the treatment plan we have discussed and agreed upon together:  Diabetes Your diabetes is more poorly controlled than before. Your A1c is 8.0 from 7.0 last time. Your goal is to have an A1c < 8.0 Medicine Changes: we increased the dose of your Januvia. Please discard your current januvia and pick up your new dose Homework: Continue to work on diet and exercise  Call us if: you notice LOW blood sugar symptoms, check your blood sugar and have a snack.  Come back to see [place] in: 3 months   Our clinic's number is [phone]. Please call with questions or concerns about what we discussed today.  Be well, Dr. Burr Medico

## 2017-03-13 NOTE — Assessment & Plan Note (Signed)
Goal A1c 7.-8. A1c has  Worsened since her previous check. - She feels she gets "cold" and she thinks this happens when her sugar is low. Advised to check blood sugar when she thinks she is having hypoglycemic symptoms. Advised to have a snack which he thinks she's having hypoglycemic symptoms. -  Continue metformin 1000 mg BID - inc Januvia 50 mg >>>100 mg daily - refilled test strips - follow up 3 months for next A1c

## 2017-03-13 NOTE — Progress Notes (Signed)
   CC: Diabetes follow-up  HPI: Sandra Ryan is a 61 y.o. female with PMH significant for diabetes who presents to [facility] today for follow-up diabetes.  History with help of Nepali interpreter  DIABETES Disease Monitoring: HgbA1c 8.0 from 7.0 at previous check Blood Sugar ranges- unable to check bc thinks strips are old  Polyuria/phagia/dipsia- +urinary frequency 10 times at night       Visual problems- none Medications: prescribed metformin 1000 mg BID, Januvia 50 mg daily  Compliance- 100% reported  Hypoglycemic symptoms- she feels that she gets cold, does not check her blood sugar. Advised to check blood sugar when she thinks she is having hypoglycemic symptoms. Advised to have a snack which he thinks she's having hypoglycemic symptoms. Does not report nervousness, sweating or palpitations.  Monitoring Labs and Parameters Last A1C:  Lab Results  Component Value Date   HGBA1C 8.0 12/25/2016    Last BPs:  BP Readings from Last 3 Encounters:  03/13/17 98/60  12/25/16 116/84  11/26/16 112/62   Review of Symptoms:  See HPI for ROS.   CC, SH/smoking status, and VS noted.  Objective: BP 98/60   Pulse 90   Temp 98.2 F (36.8 C) (Oral)   Ht 5' (1.524 m)   Wt 128 lb 6.4 oz (58.2 kg)   SpO2 98%   BMI 25.08 kg/m  GEN: NAD, alert, cooperative, and pleasant. RESPIRATORY: clear to auscultation bilaterally with no wheezes, rhonchi or rales, good effort CV: RRR, no m/r/g, no peripheral edema GI: soft, non-tender, non-distended, no hepatosplenomegaly SKIN: warm and dry, no rashes or lesions PSYCH: AAOx3, appropriate affect  Assessment and plan:  Type 2 diabetes mellitus with complication (HCC)  Goal A1c 7.-8. A1c has  Worsened since her previous check. - She feels she gets "cold" and she thinks this happens when her sugar is low. Advised to check blood sugar when she thinks she is having hypoglycemic symptoms. Advised to have a snack which he thinks she's having hypoglycemic  symptoms. -  Continue metformin 1000 mg BID - inc Januvia 50 mg >>>100 mg daily - refilled test strips - follow up 3 months for next A1c   Meds ordered this encounter  Medications  . glucose blood (ACCU-CHEK AVIVA) test strip    Sig: Use as instructed    Dispense:  100 each    Refill:  12  . metFORMIN (GLUCOPHAGE) 1000 MG tablet    Sig: Take 1 tablet (1,000 mg total) by mouth 2 (two) times daily with a meal.    Dispense:  180 tablet    Refill:  6    Directions in Nepali if possible  . sitaGLIPtin (JANUVIA) 100 MG tablet    Sig: Take 1 tablet (100 mg total) by mouth daily.    Dispense:  90 tablet    Refill:  3   Everrett Coombe, MD,MS,  PGY2 03/13/2017 3:45 PM

## 2017-03-14 ENCOUNTER — Encounter: Payer: Self-pay | Admitting: Student in an Organized Health Care Education/Training Program

## 2017-04-01 ENCOUNTER — Encounter: Payer: Self-pay | Admitting: Student in an Organized Health Care Education/Training Program

## 2017-04-01 ENCOUNTER — Ambulatory Visit: Payer: Medicaid Other | Admitting: Student in an Organized Health Care Education/Training Program

## 2017-04-01 ENCOUNTER — Other Ambulatory Visit: Payer: Self-pay

## 2017-04-01 VITALS — BP 122/78 | HR 74 | Temp 98.3°F | Ht 60.0 in | Wt 127.0 lb

## 2017-04-01 DIAGNOSIS — E1165 Type 2 diabetes mellitus with hyperglycemia: Secondary | ICD-10-CM

## 2017-04-01 DIAGNOSIS — G4700 Insomnia, unspecified: Secondary | ICD-10-CM

## 2017-04-01 DIAGNOSIS — M1711 Unilateral primary osteoarthritis, right knee: Secondary | ICD-10-CM | POA: Diagnosis not present

## 2017-04-01 DIAGNOSIS — E118 Type 2 diabetes mellitus with unspecified complications: Secondary | ICD-10-CM

## 2017-04-01 DIAGNOSIS — IMO0001 Reserved for inherently not codable concepts without codable children: Secondary | ICD-10-CM

## 2017-04-01 LAB — POCT GLYCOSYLATED HEMOGLOBIN (HGB A1C): Hemoglobin A1C: 8.3

## 2017-04-01 MED ORDER — TRAZODONE HCL 50 MG PO TABS: ORAL_TABLET | ORAL | 2 refills | Status: DC

## 2017-04-01 MED ORDER — METFORMIN HCL 1000 MG PO TABS: 1000.0000 mg | ORAL_TABLET | Freq: Two times a day (BID) | ORAL | 6 refills | Status: DC

## 2017-04-01 MED ORDER — NAPROXEN 500 MG PO TABS: 500.0000 mg | ORAL_TABLET | Freq: Two times a day (BID) | ORAL | 0 refills | Status: DC | PRN

## 2017-04-01 NOTE — Patient Instructions (Signed)
It was a pleasure seeing you today in our clinic. Today we discussed diabetes. Here is the treatment plan we have discussed and agreed upon together:  Please continue current diabetes regimen. Follow up in 3 months.  Our clinic's number is [phone]. Please call with questions or concerns about what we discussed today.  Be well, Dr. Burr Medico

## 2017-04-01 NOTE — Progress Notes (Signed)
   CC: Diabetes follow-up  HPI: Sandra Ryan is a 62 y.o. female with PMH significant T2DM who presents today for diabetes follow up.  DIABETES Patient was seen on 12/20 and Januvia was increased to 100 mg daily at that time based on an older HgbA1c. She was not yet due for another A1c at that time. Patient did not pick up the new dose of medication until 1/3. She presents today on 1/8 with elevated repeat A1c.  Disease Monitoring: 8.3 from 8.0 at last check Blood Sugar ranges-100-200 with a read at 344 on her monitor  Polyuria/phagia/dipsia- denies       Visual problems- denies Medications: metformin 1000 BID and Januvia 100 mg daily Compliance- 100% reported  Hypoglycemic symptoms- denies  Monitoring Labs and Parameters Last A1C:  Lab Results  Component Value Date   HGBA1C 8.3 04/01/2017   Last BPs:  BP Readings from Last 3 Encounters:  04/01/17 122/78  03/13/17 98/60  12/25/16 116/84   Review of Symptoms:  See HPI for ROS.   CC, SH/smoking status, and VS noted.  Objective: BP 122/78   Pulse 74   Temp 98.3 F (36.8 C) (Oral)   Ht 5' (1.524 m)   Wt 127 lb (57.6 kg)   SpO2 98%   BMI 24.80 kg/m  GEN: NAD, alert, cooperative, and pleasant. RESPIRATORY: clear to auscultation bilaterally with no wheezes, rhonchi or rales, good effort CV: RRR, no m/r/g, no peripheral edema GI: soft, non-tender, non-distended, no hepatosplenomegaly SKIN: warm and dry, no rashes or lesions NEURO: II-XII grossly intact, normal gait, peripheral sensation intact PSYCH: AAOx3, appropriate affect  Assessment and plan:  Type 2 diabetes mellitus with complication (HCC) N2D increased to 3.3 from 8 at her last check. Januvia was just increased 5 days prior to this visit. Reasonable to continue to monitor on this new dose and follow-up in 3 months for next A1c. Goal is around 8 for this patient. Continue metformin 1000 mg twice a day. Refilled today.   Orders Placed This Encounter  Procedures   . POCT glycosylated hemoglobin (Hb A1C)    Meds ordered this encounter  Medications  . naproxen (NAPROSYN) 500 MG tablet    Sig: Take 1 tablet (500 mg total) by mouth 2 (two) times daily as needed for mild pain or moderate pain.    Dispense:  30 tablet    Refill:  0  . traZODone (DESYREL) 50 MG tablet    Sig: TAKE ONE-HALF TABLET BY MOUTH ONCE DAILY AT BEDTIME AS NEEDED FOR SLEEP.    Dispense:  15 tablet    Refill:  2    Please consider 90 day supplies to promote better adherence  . metFORMIN (GLUCOPHAGE) 1000 MG tablet    Sig: Take 1 tablet (1,000 mg total) by mouth 2 (two) times daily with a meal.    Dispense:  180 tablet    Refill:  6    Directions in Nepali if possible    Everrett Coombe, MD,MS,  PGY2 04/08/2017 8:33 AM

## 2017-04-08 ENCOUNTER — Encounter: Payer: Self-pay | Admitting: Student in an Organized Health Care Education/Training Program

## 2017-04-08 NOTE — Assessment & Plan Note (Signed)
A1c increased to 3.3 from 8 at her last check. Januvia was just increased 5 days prior to this visit. Reasonable to continue to monitor on this new dose and follow-up in 3 months for next A1c. Goal is around 8 for this patient. Continue metformin 1000 mg twice a day. Refilled today.

## 2017-04-29 ENCOUNTER — Encounter: Payer: Self-pay | Admitting: Gastroenterology

## 2017-05-27 ENCOUNTER — Other Ambulatory Visit: Payer: Self-pay | Admitting: *Deleted

## 2017-05-27 DIAGNOSIS — G4700 Insomnia, unspecified: Secondary | ICD-10-CM

## 2017-05-27 MED ORDER — TRAZODONE HCL 50 MG PO TABS: ORAL_TABLET | ORAL | 2 refills | Status: DC

## 2017-06-30 LAB — HM DIABETES EYE EXAM

## 2017-07-01 ENCOUNTER — Other Ambulatory Visit: Payer: Self-pay | Admitting: *Deleted

## 2017-07-01 DIAGNOSIS — M1711 Unilateral primary osteoarthritis, right knee: Secondary | ICD-10-CM

## 2017-07-02 MED ORDER — NAPROXEN 500 MG PO TABS: 500.0000 mg | ORAL_TABLET | Freq: Two times a day (BID) | ORAL | 0 refills | Status: DC | PRN

## 2017-08-01 ENCOUNTER — Ambulatory Visit: Payer: Medicaid Other | Admitting: Student in an Organized Health Care Education/Training Program

## 2017-08-01 ENCOUNTER — Encounter: Payer: Self-pay | Admitting: Student in an Organized Health Care Education/Training Program

## 2017-08-01 ENCOUNTER — Other Ambulatory Visit: Payer: Self-pay

## 2017-08-01 VITALS — BP 124/84 | HR 91 | Temp 97.9°F | Ht 60.0 in | Wt 102.0 lb

## 2017-08-01 DIAGNOSIS — R05 Cough: Secondary | ICD-10-CM | POA: Diagnosis not present

## 2017-08-01 DIAGNOSIS — E1165 Type 2 diabetes mellitus with hyperglycemia: Secondary | ICD-10-CM

## 2017-08-01 DIAGNOSIS — E118 Type 2 diabetes mellitus with unspecified complications: Secondary | ICD-10-CM | POA: Diagnosis present

## 2017-08-01 DIAGNOSIS — IMO0002 Reserved for concepts with insufficient information to code with codable children: Secondary | ICD-10-CM

## 2017-08-01 DIAGNOSIS — R059 Cough, unspecified: Secondary | ICD-10-CM | POA: Insufficient documentation

## 2017-08-01 LAB — POCT GLYCOSYLATED HEMOGLOBIN (HGB A1C): HEMOGLOBIN A1C: 7.9

## 2017-08-01 NOTE — Progress Notes (Signed)
Subjective:    Sandra Ryan - 62 y.o. female MRN [id number]  Date of birth: [birth year]  HPI  Sandra Ryan is here for Diabetes follow up and acute cough.  Her son assists in interpreting and providing history  DIABETES Disease Monitoring: HbA1c improved, 8.3 to 7.9 Blood Sugar ranges- Does not check at home, needs new lancets  Polyuria/phagia/dipsia- none       Visual problems- visited eye doctor last month, denies blurred vision Medications: metformin 1000 mg BID, Januvia 100 mg daily Compliance- 100% reported  Hypoglycemic symptoms- Denies  COUGH - 1-2 weeks duration, nonproductive cough. She has had body ache with headache. No sick contacts. No fever or sore throat. No rhinorrhea or congestion. She has tried cough syrup at home.   HEALTHCARE MAINTENANCE - last colonoscopy in 2014   Monitoring Labs and Parameters Last A1C:  Lab Results  Component Value Date   HGBA1C 7.9 08/01/2017    Last Lipid:     Component Value Date/Time   CHOL 222 (H) 05/04/2015 1057   HDL 47 05/04/2015 1057   LDLDIRECT 139 (H) 01/31/2012 1634    Last Bmet  Potassium  Date Value Ref Range Status  03/12/2016 4.3 3.5 - 5.3 mmol/L Final   Sodium  Date Value Ref Range Status  03/12/2016 139 135 - 146 mmol/L Final   Creat  Date Value Ref Range Status  03/12/2016 0.91 0.50 - 0.99 mg/dL Final    Comment:      For patients > or = 63 years of age: The upper reference limit for Creatinine is approximately 13% higher for people identified as African-American.         Last BPs:  BP Readings from Last 3 Encounters:  08/01/17 124/84  04/01/17 122/78  03/13/17 98/60    Health Maintenance:  - Discussed colonoscopy which is due. Colonoscopy information provided, patient to schedule (with help of her son) Health Maintenance Due  Topic Date Due  . OPHTHALMOLOGY EXAM  11/10/2013  . MAMMOGRAM  10/23/2016  . PAP SMEAR  01/20/2017  . COLONOSCOPY  04/21/2017    -  reports that she has  quit smoking. She has never used smokeless tobacco. - Review of Systems: Per HPI. - Past Medical History: Patient Active Problem List   Diagnosis Date Noted  . Viral respiratory illness 08/04/2017  . Cough 08/01/2017  . Type 2 diabetes mellitus with complication ([place]) 34/19/6222  . Greater trochanteric bursitis, right 11/26/2016  . Hypercholesteremia 09/24/2016  . Generalized joint pain 09/19/2016  . Insomnia 05/14/2016  . Depression 03/12/2016  . Tinea pedis of left foot 01/02/2016  . Osteoarthritis 05/04/2015  . Vitamin B12 nutritional deficiency 12/24/2013  . Peripheral neuropathy 12/22/2013  . Diabetic retinopathy, mild, bilateral 11/11/2012  . High blood pressure associated with diabetes ([place]) 05/24/2012  . Adhesive capsulitis of left shoulder 03/12/2012  . GERD (gastroesophageal reflux disease) 01/31/2012  . Cystocele 01/08/2012  . Postmenopausal bleeding 01/02/2012  . ASCUS (atypical squamous cells of undetermined significance) on Pap smear 11/28/2011  . Cervical polyp 08/27/2011   - Medications: reviewed and updated Current Outpatient Medications  Medication Sig Dispense Refill  . ACCU-CHEK SOFTCLIX LANCETS lancets Use as instructed 100 each 12  . acetaminophen (TYLENOL) 500 MG tablet Take 1 tablet (500 mg total) by mouth 3 (three) times daily. 30 tablet 0  . aspirin 81 MG EC tablet Take 1 tablet (81 mg total) by mouth daily. Swallow whole. (Patient not taking: Reported on 03/22/2016) 90 tablet 3  .  atorvastatin (LIPITOR) 80 MG tablet Take 1 tablet (80 mg total) by mouth daily. 90 tablet 0  . cetirizine (ZYRTEC) 10 MG tablet Take 1 tablet (10 mg total) by mouth daily. (Patient not taking: Reported on 03/22/2016) 30 tablet 11  . diclofenac sodium (VOLTAREN) 1 % GEL Apply 2 g topically 4 (four) times daily. (Patient not taking: Reported on 03/22/2016) 100 g 0  . docusate sodium (COLACE) 100 MG capsule Take 1 capsule (100 mg total) by mouth 2 (two) times daily. 30 capsule 0    . glucose blood (ACCU-CHEK AVIVA) test strip Use as instructed 100 each 12  . lisinopril (PRINIVIL,ZESTRIL) 2.5 MG tablet TAKE ONE TABLET BY MOUTH ONCE DAILY 90 tablet 3  . metFORMIN (GLUCOPHAGE) 1000 MG tablet Take 1 tablet (1,000 mg total) by mouth 2 (two) times daily with a meal. 180 tablet 6  . naproxen (NAPROSYN) 500 MG tablet Take 1 tablet (500 mg total) by mouth 2 (two) times daily as needed for mild pain or moderate pain. 30 tablet 0  . omeprazole (PRILOSEC) 40 MG capsule TAKE ONE CAPSULE BY MOUTH ONCE DAILY 30 capsule 3  . sertraline (ZOLOFT) 100 MG tablet Take 1 tablet (100 mg total) by mouth daily. 30 tablet 0  . sitaGLIPtin (JANUVIA) 100 MG tablet Take 1 tablet (100 mg total) by mouth daily. 90 tablet 3  . terbinafine (LAMISIL) 1 % cream Apply 1 application topically 2 (two) times daily. (Patient not taking: Reported on 03/22/2016) 30 g 0  . traZODone (DESYREL) 50 MG tablet TAKE ONE-HALF TABLET BY MOUTH ONCE DAILY AT BEDTIME AS NEEDED FOR SLEEP. 15 tablet 2  . vitamin B-12 (CYANOCOBALAMIN) 1000 MCG tablet Take 1 tablet (1,000 mcg total) by mouth daily. (Patient not taking: Reported on 03/22/2016) 90 tablet 3   No current facility-administered medications for this visit.     Review of Systems See HPI     Objective:   Physical Exam BP 124/84   Pulse 91   Temp 97.9 F (36.6 C) (Oral)   Ht 5' (1.524 m)   Wt 102 lb (46.3 kg)   SpO2 99%   BMI 19.92 kg/m  Gen: NAD, alert, cooperative with exam, well-appearing  HEENT: NCAT, PERRL, clear conjunctiva, oropharynx without erythema or exudate, supple neck CV: RRR, good S1/S2, no murmur, no edema, capillary refill brisk  Resp: CTABL, no wheezes, non-labored Abd: SNTND, BS present, no guarding or organomegaly Skin: no rashes, normal turgor  Neuro: no gross deficits.  Psych: good insight, alert and oriented     Assessment & Plan:   Type 2 diabetes mellitus with complication (HCC) Y7X improved at 7.9 from 8.3 on last check. Plan  to c ontinue metformin and Januvia. Recheck in 3 months. Will refill diabetic supplies.  Cough Most likely an acute respiratory viral illness. Can expect cough to persist for 4 weeks after a viral illness. Other considerations would be GERD causing cough, however patient is already on a PPI. There are no red flags or concerning symptoms.  - monitor and continue OTC remedies, follow up if cough worsens or fails to improve in next 4 weeks   Orders Placed This Encounter  Procedures  . HgB A1c    No orders of the defined types were placed in this encounter.   Everrett Coombe, MD,MS,  PGY2 08/04/2017 8:35 AM

## 2017-08-04 ENCOUNTER — Encounter: Payer: Self-pay | Admitting: Student in an Organized Health Care Education/Training Program

## 2017-08-04 DIAGNOSIS — B9789 Other viral agents as the cause of diseases classified elsewhere: Secondary | ICD-10-CM | POA: Insufficient documentation

## 2017-08-04 DIAGNOSIS — J988 Other specified respiratory disorders: Secondary | ICD-10-CM

## 2017-08-04 MED ORDER — ACCU-CHEK SOFTCLIX LANCETS MISC
12 refills | Status: DC
Start: 2017-08-04 — End: 2017-08-07

## 2017-08-04 NOTE — Assessment & Plan Note (Signed)
Most likely an acute respiratory viral illness. Can expect cough to persist for 4 weeks after a viral illness. Other considerations would be GERD causing cough, however patient is already on a PPI. There are no red flags or concerning symptoms.  - monitor and continue OTC remedies, follow up if cough worsens or fails to improve in next 4 weeks

## 2017-08-04 NOTE — Addendum Note (Signed)
Addended by: Boris Sharper on: 08/04/2017 08:37 AM   Modules accepted: Orders

## 2017-08-04 NOTE — Assessment & Plan Note (Signed)
A1c improved at 7.9 from 8.3 on last check. Plan to c ontinue metformin and Januvia. Recheck in 3 months. Will refill diabetic supplies.

## 2017-08-05 ENCOUNTER — Telehealth: Payer: Self-pay

## 2017-08-05 ENCOUNTER — Other Ambulatory Visit: Payer: Self-pay | Admitting: Student in an Organized Health Care Education/Training Program

## 2017-08-05 MED ORDER — BENZOCAINE-MENTHOL 15-3.6 MG MT LOZG: 1.0000 | LOZENGE | OROMUCOSAL | 0 refills | Status: DC | PRN

## 2017-08-05 NOTE — Telephone Encounter (Signed)
[facility] faxing to ask if we can change lancets from Softclix to Accu Check. Medicaid does not pay for One touch. Ottis Stain, CMA

## 2017-08-07 ENCOUNTER — Other Ambulatory Visit: Payer: Self-pay | Admitting: Student in an Organized Health Care Education/Training Program

## 2017-08-07 MED ORDER — ACCU-CHEK MULTICLIX LANCETS MISC: 12 refills | Status: DC

## 2017-08-07 NOTE — Telephone Encounter (Signed)
Sent accu chek multiclix lancets to the pharmacy. Cross referenced with [place] medicaid preferred list, these should be covered. Thank you

## 2017-08-14 ENCOUNTER — Encounter: Payer: Self-pay | Admitting: Student in an Organized Health Care Education/Training Program

## 2017-10-01 ENCOUNTER — Ambulatory Visit: Payer: Medicaid Other | Admitting: Family Medicine

## 2017-10-01 ENCOUNTER — Other Ambulatory Visit: Payer: Self-pay

## 2017-10-01 VITALS — BP 102/62 | HR 77 | Temp 97.9°F | Ht 60.0 in | Wt 125.0 lb

## 2017-10-01 DIAGNOSIS — R42 Dizziness and giddiness: Secondary | ICD-10-CM

## 2017-10-01 DIAGNOSIS — D126 Benign neoplasm of colon, unspecified: Secondary | ICD-10-CM

## 2017-10-01 DIAGNOSIS — R059 Cough, unspecified: Secondary | ICD-10-CM

## 2017-10-01 DIAGNOSIS — R05 Cough: Secondary | ICD-10-CM | POA: Diagnosis not present

## 2017-10-01 LAB — GLUCOSE, POCT (MANUAL RESULT ENTRY): POC GLUCOSE: 194 mg/dL — AB (ref 70–99)

## 2017-10-01 NOTE — Patient Instructions (Signed)
It was a pleasure to see you today! Thank you for choosing [facility] for your primary care. Sandra Ryan was seen for cough, dizziness.   Our plans for today were:  Stop taking the nyquil.   I will prescribe you some cough pills to try. Please also try the inhaler.   Go over to [facility] ([place]) to get the chest xray.   They will call you about her colonoscopy.   I will call you about the labs for her dizziness.    Best,  Dr. Lindell Noe

## 2017-10-01 NOTE — Progress Notes (Signed)
   CC: dizzy, cough  HPI  Persistent cough - seen for this on 5/10, states this has been present x 4-6 months. Feels SOB when coughing. Thinks its asthma. No fever or chills. Coughs many times per day, not more any particular time of the day. Feels "deep." no pain with cough. No smoking, used to smoke. Stopped 2 years ago. Never tried inhalers in the past.   Dizzy - new today. Doesn't check BP at home. Not checking CBGs regularly either. Taking metformin and januvia. She has been taking nyquil for the cough.   ROS: Denies CP, SOB, abdominal pain, dysuria, changes in BMs.   CC, SH/smoking status, and VS noted  Objective: BP 102/62   Pulse 77   Temp 97.9 F (36.6 C) (Oral)   Ht 5' (1.524 m)   Wt 125 lb (56.7 kg)   SpO2 99%   BMI 24.41 kg/m  Gen: NAD, alert, cooperative, and pleasant. HEENT: NCAT, EOMI, PERRL CV: RRR, no murmur Resp: CTAB, no wheezes, non-labored, prolonged expiratory phase.  Abd: SNTND, BS present, no guarding or organomegaly Ext: No edema, warm Neuro: Alert and oriented, Speech clear, CN II -XII intact, Rhomberg negative.   Assessment and plan:  Cough: Patient presents with ongoing cough.  Based on imaging findings, she may have some underlying COPD that she does not completely understand due to language barrier.  She is not using  inhalers at present. Has been using a lot of OTC products. Lungs with no wheeze, prolonged expiratory phase. Infectious etiology less likely with time course. Will get CXR and trial albuterol. Should follow up with Dr. Valentina Lucks for PFTs for formal diagnosis if CXR is suggestive of emphysematous changes.   Dizzyness: standing BP Without orthostatic changes, has been taking lots of Nyquil, which is likely causing anticholingeric side effects. Asked her to stop this, will also check CBC, BMP, TSH. POCT sugar today not hypo or concerningly hyper.   Orders Placed This Encounter  Procedures  . DG Chest 2 View    Standing Status:   Future   Number of Occurrences:   1    Standing Expiration Date:   12/03/2018    Order Specific Question:   Reason for Exam (SYMPTOM  OR DIAGNOSIS REQUIRED)    Answer:   persistent cough, prev emphysematous changes hx tobacco use    Order Specific Question:   Preferred imaging location?    Answer:   [facility]    Order Specific Question:   Call Results- Best Contact Number?    Answer:   [phone]    Order Specific Question:   Radiology Contrast Protocol - do NOT remove file path    Answer:   [email or web address]  . CBC  . Basic metabolic panel  . TSH  . Ambulatory referral to Gastroenterology    Referral Priority:   Routine    Referral Type:   Consultation    Referral Reason:   Specialty Services Required    Number of Visits Requested:   1  . Glucose (CBG)    No orders of the defined types were placed in this encounter.  HM - placed new referral for colonoscopy, patient had polyps and is due for 44yr f/u.  Ralene Ok, MD, PGY3 10/04/2017 3:31 PM

## 2017-10-02 ENCOUNTER — Ambulatory Visit
Admission: RE | Admit: 2017-10-02 | Discharge: 2017-10-02 | Disposition: A | Discharge status: Home / Self Care | Payer: Medicaid Other | Source: Ambulatory Visit | Attending: Family Medicine | Admitting: Family Medicine

## 2017-10-02 DIAGNOSIS — R059 Cough, unspecified: Secondary | ICD-10-CM

## 2017-10-02 DIAGNOSIS — R05 Cough: Secondary | ICD-10-CM

## 2017-10-02 LAB — CBC
HEMATOCRIT: 38.2 % (ref 34.0–46.6)
HEMOGLOBIN: 12.3 g/dL (ref 11.1–15.9)
MCH: 28.3 pg (ref 26.6–33.0)
MCHC: 32.2 g/dL (ref 31.5–35.7)
MCV: 88 fL (ref 79–97)
PLATELETS: 333 10*3/uL (ref 150–450)
RBC: 4.34 x10E6/uL (ref 3.77–5.28)
RDW: 14.3 % (ref 12.3–15.4)
WBC: 6.7 10*3/uL (ref 3.4–10.8)

## 2017-10-02 LAB — BASIC METABOLIC PANEL
BUN / CREAT RATIO: 8 — AB (ref 12–28)
BUN: 6 mg/dL — ABNORMAL LOW (ref 8–27)
CHLORIDE: 101 mmol/L (ref 96–106)
CO2: 24 mmol/L (ref 20–29)
Calcium: 9 mg/dL (ref 8.7–10.3)
Creatinine, Ser: 0.72 mg/dL (ref 0.57–1.00)
GFR, EST AFRICAN AMERICAN: 103 mL/min/{1.73_m2} (ref >59)
GFR, EST NON AFRICAN AMERICAN: 89 mL/min/{1.73_m2} (ref >59)
Glucose: 178 mg/dL — ABNORMAL HIGH (ref 65–99)
POTASSIUM: 4.6 mmol/L (ref 3.5–5.2)
SODIUM: 138 mmol/L (ref 134–144)

## 2017-10-02 LAB — TSH: TSH: 0.479 u[IU]/mL (ref 0.450–4.500)

## 2017-10-04 MED ORDER — ALBUTEROL SULFATE HFA 108 (90 BASE) MCG/ACT IN AERS: 2.0000 | INHALATION_SPRAY | RESPIRATORY_TRACT | 0 refills | Status: DC | PRN

## 2017-10-04 MED ORDER — BENZONATATE 100 MG PO CAPS: 100.0000 mg | ORAL_CAPSULE | Freq: Two times a day (BID) | ORAL | 0 refills | Status: DC | PRN

## 2017-10-07 ENCOUNTER — Telehealth: Payer: Self-pay | Admitting: *Deleted

## 2017-10-07 ENCOUNTER — Ambulatory Visit: Payer: Medicaid Other | Admitting: Family Medicine

## 2017-10-07 NOTE — Telephone Encounter (Signed)
Tried to contact pt son and phone only rang. If pt calls back please inform them of below. Katharina Caper, April D, [place]

## 2017-10-07 NOTE — Telephone Encounter (Signed)
-----   Message from Sela Hilding, MD sent at 10/04/2017  3:37 PM EDT ----- [facility] team, could you let the son know (I updated his # at last visit) that labs were normal, don't suggest a cause of her dizziness. Please remind them that she should not be taking Nyquil any longer. Xray of her chest was clear. If she continues to have cough and it is not helped by the inhaler, she should come back and see Dr. Burr Medico.

## 2017-10-13 NOTE — Telephone Encounter (Signed)
LVM to call office back, please inform pt or son of below if they call back. Katharina Caper, Georganne Siple D, [place]

## 2017-10-30 ENCOUNTER — Other Ambulatory Visit: Payer: Self-pay | Admitting: Student in an Organized Health Care Education/Training Program

## 2017-10-30 DIAGNOSIS — E1165 Type 2 diabetes mellitus with hyperglycemia: Principal | ICD-10-CM

## 2017-10-30 DIAGNOSIS — IMO0001 Reserved for inherently not codable concepts without codable children: Secondary | ICD-10-CM

## 2017-11-11 NOTE — Telephone Encounter (Signed)
Pt has appointment scheduled for 11/19/2017 with PCP. Sandra Ryan, [name], [place]

## 2017-11-19 ENCOUNTER — Ambulatory Visit: Payer: Medicaid Other | Admitting: Family Medicine

## 2017-11-19 ENCOUNTER — Other Ambulatory Visit: Payer: Self-pay

## 2017-11-19 ENCOUNTER — Encounter: Payer: Self-pay | Admitting: Family Medicine

## 2017-11-19 VITALS — BP 118/76 | HR 84 | Temp 98.7°F | Ht 60.0 in | Wt 124.0 lb

## 2017-11-19 DIAGNOSIS — Z1211 Encounter for screening for malignant neoplasm of colon: Secondary | ICD-10-CM

## 2017-11-19 DIAGNOSIS — R05 Cough: Secondary | ICD-10-CM | POA: Diagnosis present

## 2017-11-19 DIAGNOSIS — R059 Cough, unspecified: Secondary | ICD-10-CM

## 2017-11-19 MED ORDER — ALBUTEROL SULFATE HFA 108 (90 BASE) MCG/ACT IN AERS: 2.0000 | INHALATION_SPRAY | RESPIRATORY_TRACT | 0 refills | Status: DC | PRN

## 2017-11-19 MED ORDER — FLUTICASONE PROPIONATE 50 MCG/ACT NA SUSP: 2.0000 | Freq: Every day | NASAL | 6 refills | Status: DC

## 2017-11-19 NOTE — Patient Instructions (Signed)
It was a pleasure to see you today! Thank you for choosing [facility] for your primary care. Ivry Oehler was seen for cough.   Our plans for today were:  Your cough may be due to several things- it could be due to her lisinopril medicine.   Stop lisinopril and do not restart this.   Since she used to smoke, she could have something called COPD which inflammation in her airways and that can cause a cough. For this, please pick up the inhaler, which I will send again today. She uses that as needed up to every 4 hours.   Last, cough can be caused by allergies in her nose. She should try the nasal spray that I am sending.   Also, I want you to come back to see Dr. Valentina Lucks, who is our pharmacist, to go over all of her medicines. You should bring all pill bottles that day including old ones.    Best,  Dr. Lindell Noe

## 2017-11-19 NOTE — Progress Notes (Signed)
   CC: chronic cough  HPI  Cough - no change at all since last visit. Went to pick up rx and was unable to get them, pharmacy said they weren't there and they did not try again or call us. They said nothing was ready. Has tried garlic water at home. Feels it is getting worse. Worse during the day. When she starts coughing, has another episode after 30 min. Some meds went missing in a move last week. Cough Not more after she eats. All the time no matter what. One fever 3 days ago, but didn't measure temp. Took APAP and didn't have any additional fevers. Used to smoke, quite 4 years ago. No recollection of trying nasal spray for cough.   Wt Readings from Last 3 Encounters:  11/19/17 124 lb (56.2 kg)  10/01/17 125 lb (56.7 kg)  08/01/17 102 lb (46.3 kg)   Med review - not taking ASA, is taking atorv, didn't get albuterol or tessalon, not taking cetirizine, not using voltaren, is taking lisinopril (but may be missing in move - on repeat questioning to son maybe she hasn't been taking this x 2 weeks since move? He isn't sure), is taking metformin, is taking naproxen, is taking trazodone, stopped prilosec last year, son has never heard of sertraline  Son thinks she is taking:  lipitor  [place] Lisinopril Trazodone Metformin  ROS: Denies CP, SOB, abdominal pain, dysuria, changes in BMs.   CC, SH/smoking status, and VS noted  Objective: BP 118/76   Pulse 84   Temp 98.7 F (37.1 C) (Oral)   Ht 5' (1.524 m)   Wt 124 lb (56.2 kg)   SpO2 99%   BMI 24.22 kg/m  Gen: NAD, alert, cooperative, and pleasant. HEENT: NCAT, EOMI, PERRL CV: RRR, no murmur Resp: CTAB, no wheezes, non-labored Ext: No edema, warm Neuro: Alert and oriented, Speech clear, No gross deficits  Assessment and plan:  Cough Unclear etiology, but chronic and not improving. May be multifactorial, and thus we will take a broad approach. First, STOP lisinopril. Also, add albuterol and fluticasone for consideration of COPD  and post nasal drip respectively. I believe these have been tried before, but despite son interpreting they have trouble understanding the healthcare system and which meds to take chronically as well as how to pick them up. If they are able to pick up these medications and use them regularly without relief of cough, next step would be PVTs and or CT chest. Again, lungs sound clear today.    Orders Placed This Encounter  Procedures  . Ambulatory referral to Gastroenterology    Referral Priority:   Routine    Referral Type:   Consultation    Referral Reason:   Specialty Services Required    Number of Visits Requested:   1    Meds ordered this encounter  Medications  . albuterol (PROVENTIL HFA;VENTOLIN HFA) 108 (90 Base) MCG/ACT inhaler    Sig: Inhale 2 puffs into the lungs every 4 (four) hours as needed for wheezing or shortness of breath.    Dispense:  1 Inhaler    Refill:  0  . fluticasone (FLONASE) 50 MCG/ACT nasal spray    Sig: Place 2 sprays into both nostrils daily.    Dispense:  16 g    Refill:  6    Health Maintenance reviewed - last colonoscopy was 5 year follow up, due and patient requested referral to repeat this.  Ralene Ok, MD, PGY3 11/20/2017 9:02 PM

## 2017-11-20 NOTE — Assessment & Plan Note (Signed)
Unclear etiology, but chronic and not improving. May be multifactorial, and thus we will take a broad approach. First, STOP lisinopril. Also, add albuterol and fluticasone for consideration of COPD and post nasal drip respectively. I believe these have been tried before, but despite son interpreting they have trouble understanding the healthcare system and which meds to take chronically as well as how to pick them up. If they are able to pick up these medications and use them regularly without relief of cough, next step would be PVTs and or CT chest. Again, lungs sound clear today.

## 2017-11-27 ENCOUNTER — Encounter: Payer: Self-pay | Admitting: Pharmacist

## 2017-11-27 ENCOUNTER — Ambulatory Visit: Payer: Medicaid Other | Admitting: Pharmacist

## 2017-11-27 DIAGNOSIS — R05 Cough: Secondary | ICD-10-CM

## 2017-11-27 DIAGNOSIS — R059 Cough, unspecified: Secondary | ICD-10-CM

## 2017-11-27 NOTE — Progress Notes (Signed)
   Nepali interpreter Essie Christine [phone] used  S:    Patient arrives in good spirits with her son, ambulating without assistance. Presents for medication review  Patient was referred by Dr. Lindell Noe at last appointment on 11/19/2017. At that time, patient and her son were unable to confidently review her current medications. Patient did not bring her medications to our appointment this morning.   Patient continues to complain of a cough today. She notes that it used to be nonproductive, but seems more productive recently. She seems to feel like the over the counter remedies that have been tried have not helped. She states that this cough has persisted for the past 9 months. At last visit, Dr. Lindell Noe d/c lisinopril to see if cough improved. Patient states that cough has actually seemed worse in the past week.   O: Physical Exam  Constitutional: She appears well-developed and well-nourished.  Vitals reviewed.   Review of Systems  All other systems reviewed and are negative.   Vitals:   11/27/17 0856  BP: 134/82  Pulse: 88  SpO2: 98%   A/P:  Medication Review - Cough x 9 months.  No obvious drug causes of cough identified.  - Difficult to verbally review medications with patient and her son, as they did not recognize many names. Contacted [facility] on [address] to review refill history.  Determined Atorvastatin, Lisinopril were not filled recently.   The sitagliptin trazodone were filled recently.   Oral and nasal inhalers were filled recently.    No changes in medication today.  Asked for all medications to be brought to next visit.    Patient verbalized understanding of results and education.  Written pt instructions provided.   F/U PCP regarding cough .   Total time in face to face counseling 30 minutes.  Patient seen with Sharyne Peach, PharmD Candidate, Isaias Sakai, PharmD, PGY1 Pharmacy Resident, and Catie Darnelle Maffucci, PharmD,  PGY2 Pharmacy Resident.

## 2017-11-27 NOTE — Patient Instructions (Addendum)
It was great to meet you today!   Continue to hold off on taking lisinopril like the doctor recommended.    Schedule follow up with Dr. Burr Medico regarding your cough. Bring all of your medications to the next appointment for her to review.   ?? ??????? ????? ???? ??????!   ?????????? ??????? ??? ????? ??????????? ??? ???????????   ?????? ?????? ????????? ??? ?en???? ??? ?????? ??????????? ????????? ???? ????? ?????????????? ??????? ??? ??????? ????????????

## 2017-11-28 NOTE — Assessment & Plan Note (Signed)
Medication Revieww - Cough x 9 months.  No obvious drug causes of cough identified.  - Difficult to verbally review medications with patient and her son, as they did not recognize many names. Contacted [facility] on [address] to review refill history.  Determined Atorvastatin, Lisinopril were not filled recently.   The sitagliptin trazodone were filled recently.   Oral and nasal inhalers were filled recently.    No changes in medication today.  Asked for all medications to be brought to next visit.

## 2017-12-04 ENCOUNTER — Other Ambulatory Visit: Payer: Self-pay | Admitting: Student in an Organized Health Care Education/Training Program

## 2017-12-04 DIAGNOSIS — G4700 Insomnia, unspecified: Secondary | ICD-10-CM

## 2017-12-10 ENCOUNTER — Other Ambulatory Visit: Payer: Self-pay

## 2017-12-10 ENCOUNTER — Ambulatory Visit: Payer: Medicaid Other | Admitting: Student in an Organized Health Care Education/Training Program

## 2017-12-10 ENCOUNTER — Encounter: Payer: Self-pay | Admitting: Student in an Organized Health Care Education/Training Program

## 2017-12-10 VITALS — BP 116/72 | HR 91 | Temp 98.4°F | Ht 60.0 in | Wt 124.0 lb

## 2017-12-10 DIAGNOSIS — E1165 Type 2 diabetes mellitus with hyperglycemia: Secondary | ICD-10-CM

## 2017-12-10 DIAGNOSIS — Z129 Encounter for screening for malignant neoplasm, site unspecified: Secondary | ICD-10-CM

## 2017-12-10 DIAGNOSIS — E118 Type 2 diabetes mellitus with unspecified complications: Secondary | ICD-10-CM

## 2017-12-10 DIAGNOSIS — IMO0002 Reserved for concepts with insufficient information to code with codable children: Secondary | ICD-10-CM

## 2017-12-10 DIAGNOSIS — IMO0001 Reserved for inherently not codable concepts without codable children: Secondary | ICD-10-CM

## 2017-12-10 LAB — POCT GLYCOSYLATED HEMOGLOBIN (HGB A1C): HBA1C, POC (CONTROLLED DIABETIC RANGE): 7.8 % — AB (ref 0.0–7.0)

## 2017-12-10 MED ORDER — ACCU-CHEK MULTICLIX LANCETS MISC: 12 refills | Status: DC

## 2017-12-10 MED ORDER — ATORVASTATIN CALCIUM 80 MG PO TABS: 80.0000 mg | ORAL_TABLET | Freq: Every day | ORAL | 0 refills | Status: DC

## 2017-12-10 MED ORDER — ASPIRIN 81 MG PO TBEC: 81.0000 mg | DELAYED_RELEASE_TABLET | Freq: Every day | ORAL | 3 refills | Status: DC

## 2017-12-10 NOTE — Patient Instructions (Signed)
It was a pleasure seeing you today in our clinic.   Please schedule a follow up for diabetes.  Our clinic's number is [phone]. Please call with questions or concerns about what we discussed today.  Be well, Dr. Burr Medico

## 2017-12-10 NOTE — Progress Notes (Signed)
   CC: Follow-up for cough and medical reconciliation  HPI: Sandra Ryan is a 62 y.o. female   Son interprets for the patient  Medical reconciliation Patient presents for follow-up of cough.  Patient was previously seen for this and lisinopril was discontinued.  She also followed up with pharmacy for medical reconciliation, however did not bring her medicines to that visit.  She brings all of her medications into this visit.  Medications were reviewed and updated in the chart.  Of note, she has atorvastatin with her, but has been taking this "as needed".  This was discussed.  Additionally she is not taking a baby aspirin, so this was additionally discussed and prescribed.  Cough Cough has been improving since she discontinued lisinopril.  The bottle of lisinopril pills was thrown out in the office today with the permission of the patient so as not to the further confuse the patient with regards to which medicines she should be taking. Uses albuterol once daily.  Discussed how this can be used as an as needed medication.  Age-appropriate cancer screening The patient is present with her son who manages her medical care.  He reports that he has been trying to get her in for colonoscopy, however every time he calls for colonoscopy he is told that the office will call back and then he does not know if he is missing the calls.  We will go ahead and place a referral for colonoscopy today so that we can help coordinate this procedure for the patient.  Review of Symptoms:  See HPI for ROS.   CC, SH/smoking status, and VS noted.  Objective: BP 116/72   Pulse 91   Temp 98.4 F (36.9 C) (Oral)   Ht 5' (1.524 m)   Wt 124 lb (56.2 kg)   SpO2 98%   BMI 24.22 kg/m  GEN: NAD, alert, cooperative, and pleasant. RESPIRATORY: clear to auscultation bilaterally with no wheezes, rhonchi or rales, good effort CV: RRR, no m/r/g, no peripheral edema GI: soft, non-tender, non-distended, no  hepatosplenomegaly SKIN: warm and dry, no rashes or lesions NEURO: II-XII grossly intact, normal gait, peripheral sensation intact PSYCH: AAOx3, appropriate affect  Assessment and plan:  1. Medical reconciliation Medicines were reviewed and updated in the chart.  Patient was advised to take atorvastatin daily not as needed.  Aspirin was also sent.  Necessary refills were sent.  Lisinopril was discarded the patient's permission.  Her blood pressure remains controlled today.  2. Cough patient reports that cough is improved.  This may have been due to an URI, however it may have been ineffective lisinopril.  Blood pressure remains controlled today off of lisinopril, so we will not continue this medicine.  3. Cancer screening - Ambulatory referral to Gastroenterology for colonoscopy  4. Diabetes - will check A1c on the way out today because she is due for this test. Asked her to schedule follow up so we can manage her diabetes in the next visit.  Orders Placed This Encounter  Procedures  . Ambulatory referral to Gastroenterology    Referral Priority:   Routine    Referral Type:   Consultation    Referral Reason:   Specialty Services Required    Number of Visits Requested:   1  . POCT glycosylated hemoglobin (Hb A1C)   Everrett Coombe, MD,MS,  PGY3 12/11/2017 8:42 AM

## 2017-12-24 ENCOUNTER — Ambulatory Visit: Payer: Medicaid Other | Admitting: Student in an Organized Health Care Education/Training Program

## 2018-01-14 ENCOUNTER — Ambulatory Visit: Payer: Medicaid Other | Admitting: Student in an Organized Health Care Education/Training Program

## 2018-01-14 VITALS — BP 112/78 | HR 93 | Temp 99.1°F | Wt 125.6 lb

## 2018-01-14 DIAGNOSIS — K5900 Constipation, unspecified: Secondary | ICD-10-CM | POA: Diagnosis not present

## 2018-01-14 DIAGNOSIS — E118 Type 2 diabetes mellitus with unspecified complications: Secondary | ICD-10-CM | POA: Diagnosis present

## 2018-01-14 MED ORDER — ACCU-CHEK MULTICLIX LANCETS MISC: 12 refills | Status: DC

## 2018-01-14 NOTE — Patient Instructions (Signed)
It was a pleasure seeing you today in our clinic.   Please focus on diet and exercise for the next 2 months. Follow up in 2 months.  Our clinic's number is [phone]. Please call with questions or concerns about what we discussed today.  Be well, Dr. Burr Medico

## 2018-01-14 NOTE — Progress Notes (Signed)
   CC: Diabetes Follow-up  HPI: Sandra Ryan is a 62 y.o. female  duration.   DIABETES Disease Monitoring: A1c 7.8 from 7.9 previously Blood Sugar ranges- has not been checking, needs lancets ordered. (Ordered today)  Polyuria/phagia/dipsia- none       Visual problems- none Medications: Januvia 100 mg, metformin 1000 mg BID Compliance- 100% reported compliance.  Hypoglycemic symptoms- denies Patient is on ASA.  Constipation -  patient went 4-5 days without BM, then had large BM after taking miralax.  She feels that this has now resolved but wanted to confirm that it was okay to take MiraLAX as needed for constipation.  Monitoring Labs and Parameters Last A1C:  Lab Results  Component Value Date   HGBA1C 7.8 (A) 12/10/2017    Last Lipid:     Component Value Date/Time   CHOL 222 (H) 05/04/2015 1057   HDL 47 05/04/2015 1057   LDLDIRECT 139 (H) 01/31/2012 1634    Last Bmet  Potassium  Date Value Ref Range Status  10/01/2017 4.6 3.5 - 5.2 mmol/L Final   Sodium  Date Value Ref Range Status  10/01/2017 138 134 - 144 mmol/L Final   Creat  Date Value Ref Range Status  03/12/2016 0.91 0.50 - 0.99 mg/dL Final    Comment:      For patients > or = 63 years of age: The upper reference limit for Creatinine is approximately 13% higher for people identified as African-American.      Creatinine, Ser  Date Value Ref Range Status  10/01/2017 0.72 0.57 - 1.00 mg/dL Final      Last BPs:  BP Readings from Last 3 Encounters:  01/14/18 112/78  12/10/17 116/72  11/27/17 134/82     Review of Symptoms:  See HPI for ROS.   CC, SH/smoking status, and VS noted.  Objective: BP 112/78   Pulse 93   Temp 99.1 F (37.3 C) (Oral)   Wt 125 lb 9.6 oz (57 kg)   SpO2 99%   BMI 24.53 kg/m  GEN: NAD, alert, cooperative, and pleasant. RESPIRATORY: Comfortable work of breathing, speaks in full sentences CV: Regular rate noted, distal extremities well perfused and warm without  edema GI: Soft, nondistended SKIN: warm and dry, no rashes or lesions NEURO: II-XII grossly intact MSK: Moves 4 extremities equally PSYCH: AAOx3, appropriate affect  Assessment and plan:  T2DM Microalbuminuria testing ordered but patient left without giving a sample by accident. We will have to complete this test at her next visit. Patient had a cough with lisinopril so would consider an ARB if required. - Advised patient to focus on healthy diet including vegetables rather than just rice for the next 2 months  - cut back on carbohydrates - continue current medical management as A1c has improved slightly  Constipation - counseled on how to take miralax PRN 1-2 soft stools per day  Everrett Coombe, MD,MS,  PGY3 01/14/2018 4:28 PM

## 2018-03-26 ENCOUNTER — Ambulatory Visit: Payer: Medicaid Other | Admitting: Student in an Organized Health Care Education/Training Program

## 2018-03-26 ENCOUNTER — Encounter: Payer: Self-pay | Admitting: Student in an Organized Health Care Education/Training Program

## 2018-03-26 ENCOUNTER — Other Ambulatory Visit: Payer: Self-pay

## 2018-03-26 VITALS — BP 120/68 | HR 93 | Temp 98.3°F | Wt 125.8 lb

## 2018-03-26 DIAGNOSIS — M549 Dorsalgia, unspecified: Secondary | ICD-10-CM | POA: Diagnosis not present

## 2018-03-26 DIAGNOSIS — M25551 Pain in right hip: Secondary | ICD-10-CM | POA: Diagnosis not present

## 2018-03-26 DIAGNOSIS — R300 Dysuria: Secondary | ICD-10-CM | POA: Diagnosis not present

## 2018-03-26 DIAGNOSIS — E118 Type 2 diabetes mellitus with unspecified complications: Secondary | ICD-10-CM | POA: Diagnosis not present

## 2018-03-26 LAB — POCT URINALYSIS DIP (MANUAL ENTRY)
BILIRUBIN UA: NEGATIVE mg/dL
Bilirubin, UA: NEGATIVE
Blood, UA: NEGATIVE
Glucose, UA: NEGATIVE mg/dL
Leukocytes, UA: NEGATIVE
Nitrite, UA: NEGATIVE
PH UA: 6 (ref 5.0–8.0)
PROTEIN UA: NEGATIVE mg/dL
Urobilinogen, UA: 0.2 E.U./dL

## 2018-03-26 LAB — POCT GLYCOSYLATED HEMOGLOBIN (HGB A1C): HBA1C, POC (CONTROLLED DIABETIC RANGE): 7.9 % — AB (ref 0.0–7.0)

## 2018-03-26 NOTE — Progress Notes (Signed)
Subjective:    Sandra Ryan - 63 y.o. female MRN [id number]  Date of birth: 05/19/1955  HPI  Sandra Ryan is here for back pain and follow up diabetes.  Back pain - Patient endorses 2 months of bilateral lumbar back pain that radiates to her anterior right hip. Pain does not radiate down her leg and is not associated with paresthesias. Discomfort is described as pinching/burning and bothers her most when she lies on her right side. Discomfort is not worse wit hwalking or stairs. She sometimes takes naproxen. No fevers, trauma, or hx of cancer. No fecal or urinary incontinence.   Urine  Endorses occasional burning (described as "heat" through the interpreter) with urination. No hematuria or urinary frequency. No Nausea, vomiting, diarrhea or constipation.  Diabetes A1c 7.9 from 7.8 previously. Taking Januvia and metformin. No polyuria/polydipsia/polyphagia. No low glucoses reported.   ROS see HPI Smoking Status noted.   Health Maintenance:  -  Health Maintenance Due  Topic Date Due  . URINE MICROALBUMIN  01/30/2013  . MAMMOGRAM  10/23/2016  . PAP SMEAR-Modifier  01/20/2017  . COLONOSCOPY  04/21/2017  . FOOT EXAM  09/24/2017  . INFLUENZA VACCINE  10/23/2017    -  reports that she has quit smoking. She has never used smokeless tobacco. - Review of Systems: Per HPI. - Past Medical History: Patient Active Problem List   Diagnosis Date Noted  . Right hip pain 03/27/2018  . Burning with urination 03/27/2018  . Viral respiratory illness 08/04/2017  . Type 2 diabetes mellitus with complication ([place]) 67/34/1937  . Hypercholesteremia 09/24/2016  . Generalized joint pain 09/19/2016  . Depression 03/12/2016  . Osteoarthritis 05/04/2015  . Vitamin B12 nutritional deficiency 12/24/2013  . Peripheral neuropathy 12/22/2013  . Diabetic retinopathy, mild, bilateral 11/11/2012  . High blood pressure associated with diabetes ([place]) 05/24/2012  . GERD (gastroesophageal reflux disease)  01/31/2012  . Cystocele 01/08/2012  . ASCUS (atypical squamous cells of undetermined significance) on Pap smear 11/28/2011  . Cervical polyp 08/27/2011   - Medications: reviewed and updated Current Outpatient Medications  Medication Sig Dispense Refill  . albuterol (PROVENTIL HFA;VENTOLIN HFA) 108 (90 Base) MCG/ACT inhaler Inhale 2 puffs into the lungs every 4 (four) hours as needed for wheezing or shortness of breath. (Patient not taking: Reported on 11/27/2017) 1 Inhaler 0  . aspirin 81 MG EC tablet Take 1 tablet (81 mg total) by mouth daily. Swallow whole. 90 tablet 3  . atorvastatin (LIPITOR) 80 MG tablet Take 1 tablet (80 mg total) by mouth daily. 90 tablet 0  . fluticasone (FLONASE) 50 MCG/ACT nasal spray Place 2 sprays into both nostrils daily. 16 g 6  . glucose blood (ACCU-CHEK AVIVA) test strip Use as instructed 100 each 12  . Lancets (ACCU-CHEK MULTICLIX) lancets Use as instructed 100 each 12  . metFORMIN (GLUCOPHAGE) 1000 MG tablet Take 1 tablet (1,000 mg total) by mouth 2 (two) times daily with a meal. 180 tablet 6  . naproxen (NAPROSYN) 500 MG tablet Take 1 tablet (500 mg total) by mouth 2 (two) times daily as needed for mild pain or moderate pain. (Patient not taking: Reported on 11/27/2017) 30 tablet 0  . sitaGLIPtin (JANUVIA) 100 MG tablet Take 1 tablet (100 mg total) by mouth daily. 90 tablet 3  . traZODone (DESYREL) 50 MG tablet TAKE 1/2 (ONE-HALF) TABLET BY MOUTH ONCE DAILY AT BEDTIME AS NEEDED FOR SLEEP 15 tablet 2   No current facility-administered medications for this visit.     Review  of Systems See HPI     Objective:   Physical Exam BP 120/68   Pulse 93   Temp 98.3 F (36.8 C) (Oral)   Wt 125 lb 12.8 oz (57.1 kg)   SpO2 98%   BMI 24.57 kg/m  GEN: NAD, alert, cooperative, and pleasant. RESPIRATORY: Comfortable work of breathing, speaks in full sentences CV: Regular rate noted, distal extremities well perfused and warm without edema GI: Soft,  nondistended SKIN: warm and dry, no rashes or lesions NEURO: II-XII grossly intact MSK: Back: no cervical ttp. Mild lumbar paraspinal tenderness to palpation bilaterally. HIP: No pain with internal rotation, no greater trochanteric palpable tenderness. 5/5 strength LE throughout. Seems to have some hip abductor weakness bilaterally, though difficult to assess due to challenges getting her to follow commands on exam through the interpreter. PSYCH: AAOx3, appropriate affect   Assessment & Plan:   Right hip pain Exam and history are inconsistent with true hip pain (good ROM, no pain with internal rotation) and also not fit greater trochanteric bursitis with no palpable tenderness. Etiology may be piriformis syndrome or ileopsoas strain.  - abductor strengthening and stretching exercises were provided - follow up if symptoms worsen or fail to improve - next steps: could consider XR and/or formal PT vs diagnostic and therapeutic steroid injection to greater trochanteric region  Burning with urination UA does not indicate infectious cause of discomfort. Advised patient to stay hydrated, will continue to monitor. Reasons to return to care discussed.  Type 2 diabetes mellitus with complication (HCC) P2Z at goal 7.5-8. Hx of hypoglycemia, but not on this regimen. Continue current management.   Orders Placed This Encounter  Procedures  . HgB A1c  . POCT urinalysis dipstick    No orders of the defined types were placed in this encounter.   Everrett Coombe, MD,MS,  PGY3 03/27/2018 11:27 AM

## 2018-03-26 NOTE — Patient Instructions (Signed)
It was a pleasure seeing you today in our clinic. Here is the treatment plan we have discussed and agreed upon together:  Our clinic's number is [phone]. Please call with questions or concerns about what we discussed today.  Be well, Dr. Burr Medico

## 2018-03-27 DIAGNOSIS — M25551 Pain in right hip: Secondary | ICD-10-CM | POA: Insufficient documentation

## 2018-03-27 DIAGNOSIS — R300 Dysuria: Secondary | ICD-10-CM | POA: Insufficient documentation

## 2018-03-27 NOTE — Assessment & Plan Note (Signed)
UA does not indicate infectious cause of discomfort. Advised patient to stay hydrated, will continue to monitor. Reasons to return to care discussed.

## 2018-03-27 NOTE — Assessment & Plan Note (Addendum)
A1c at goal 7.5-8. Hx of hypoglycemia, but not on this regimen. Continue current management.

## 2018-03-27 NOTE — Assessment & Plan Note (Signed)
Exam and history are inconsistent with true hip pain (good ROM, no pain with internal rotation) and also not fit greater trochanteric bursitis with no palpable tenderness. Etiology may be piriformis syndrome or ileopsoas strain.  - abductor strengthening and stretching exercises were provided - follow up if symptoms worsen or fail to improve - next steps: could consider XR and/or formal PT vs diagnostic and therapeutic steroid injection to greater trochanteric region

## 2018-04-02 ENCOUNTER — Other Ambulatory Visit: Payer: Self-pay | Admitting: Student in an Organized Health Care Education/Training Program

## 2018-04-02 DIAGNOSIS — E1165 Type 2 diabetes mellitus with hyperglycemia: Principal | ICD-10-CM

## 2018-04-02 DIAGNOSIS — IMO0001 Reserved for inherently not codable concepts without codable children: Secondary | ICD-10-CM

## 2018-04-14 ENCOUNTER — Other Ambulatory Visit: Payer: Self-pay | Admitting: Student in an Organized Health Care Education/Training Program

## 2018-04-14 DIAGNOSIS — IMO0001 Reserved for inherently not codable concepts without codable children: Secondary | ICD-10-CM

## 2018-04-14 DIAGNOSIS — E1165 Type 2 diabetes mellitus with hyperglycemia: Principal | ICD-10-CM

## 2018-05-17 ENCOUNTER — Other Ambulatory Visit: Payer: Self-pay | Admitting: Student in an Organized Health Care Education/Training Program

## 2018-06-10 ENCOUNTER — Other Ambulatory Visit: Payer: Self-pay | Admitting: Student in an Organized Health Care Education/Training Program

## 2018-06-10 DIAGNOSIS — G4700 Insomnia, unspecified: Secondary | ICD-10-CM

## 2018-07-03 ENCOUNTER — Other Ambulatory Visit: Payer: Self-pay | Admitting: Student in an Organized Health Care Education/Training Program

## 2018-07-03 DIAGNOSIS — IMO0001 Reserved for inherently not codable concepts without codable children: Secondary | ICD-10-CM

## 2018-07-03 DIAGNOSIS — E1165 Type 2 diabetes mellitus with hyperglycemia: Principal | ICD-10-CM

## 2018-07-18 ENCOUNTER — Other Ambulatory Visit: Payer: Self-pay | Admitting: Student in an Organized Health Care Education/Training Program

## 2018-07-18 DIAGNOSIS — G4700 Insomnia, unspecified: Secondary | ICD-10-CM

## 2018-08-21 ENCOUNTER — Other Ambulatory Visit: Payer: Self-pay | Admitting: Student in an Organized Health Care Education/Training Program

## 2018-08-21 DIAGNOSIS — G4700 Insomnia, unspecified: Secondary | ICD-10-CM

## 2018-09-02 ENCOUNTER — Other Ambulatory Visit: Payer: Self-pay | Admitting: Student in an Organized Health Care Education/Training Program

## 2018-10-03 IMAGING — CR DG CHEST 2V
2 series · 2 of 2 positions shown · non-contrast
Comparison: 10/12/2014

CLINICAL DATA: Dry cough for 6 months, diabetes mellitus, GERD,
tuberculosis treated for 4 months

EXAM:
CHEST - 2 VIEW

[w chest pa]
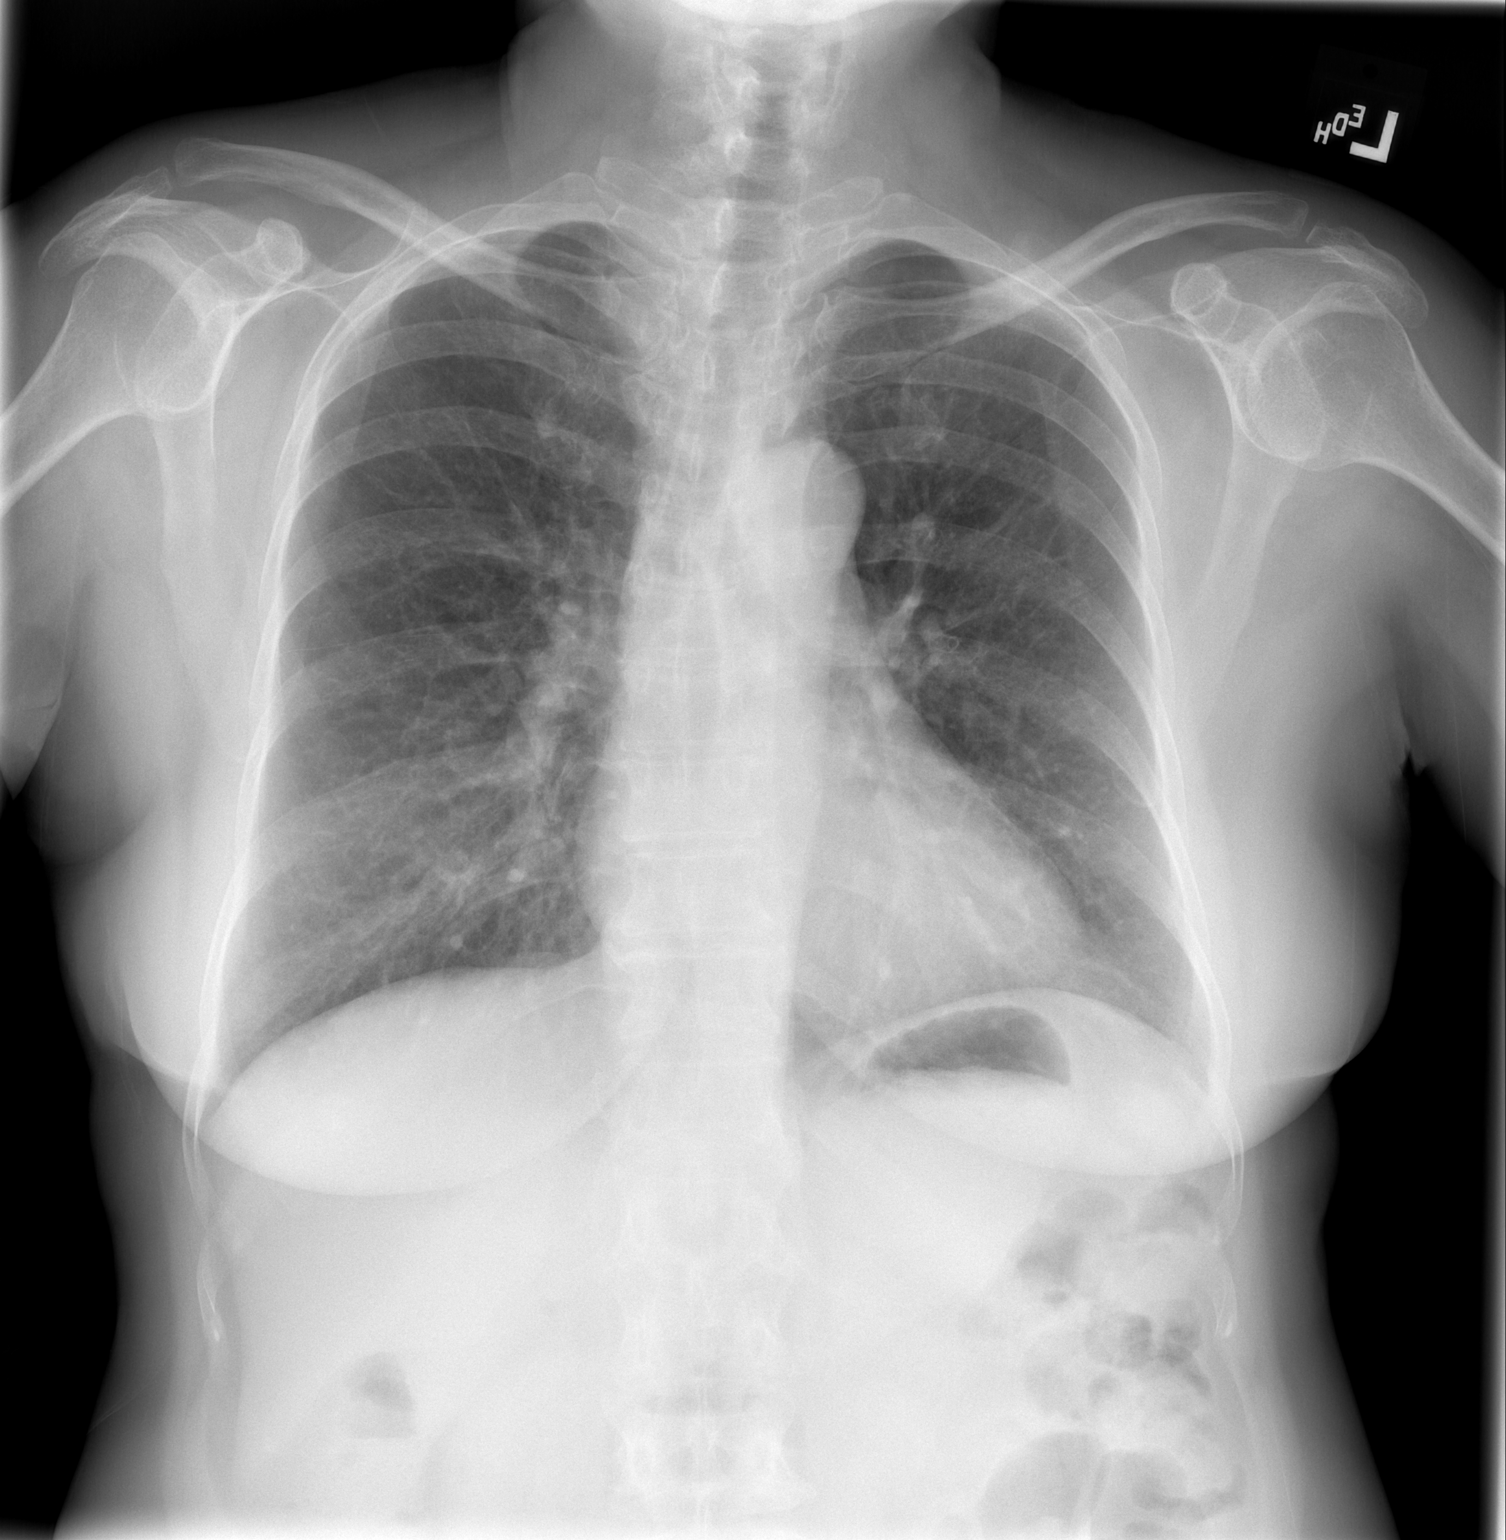

[w chest lat]
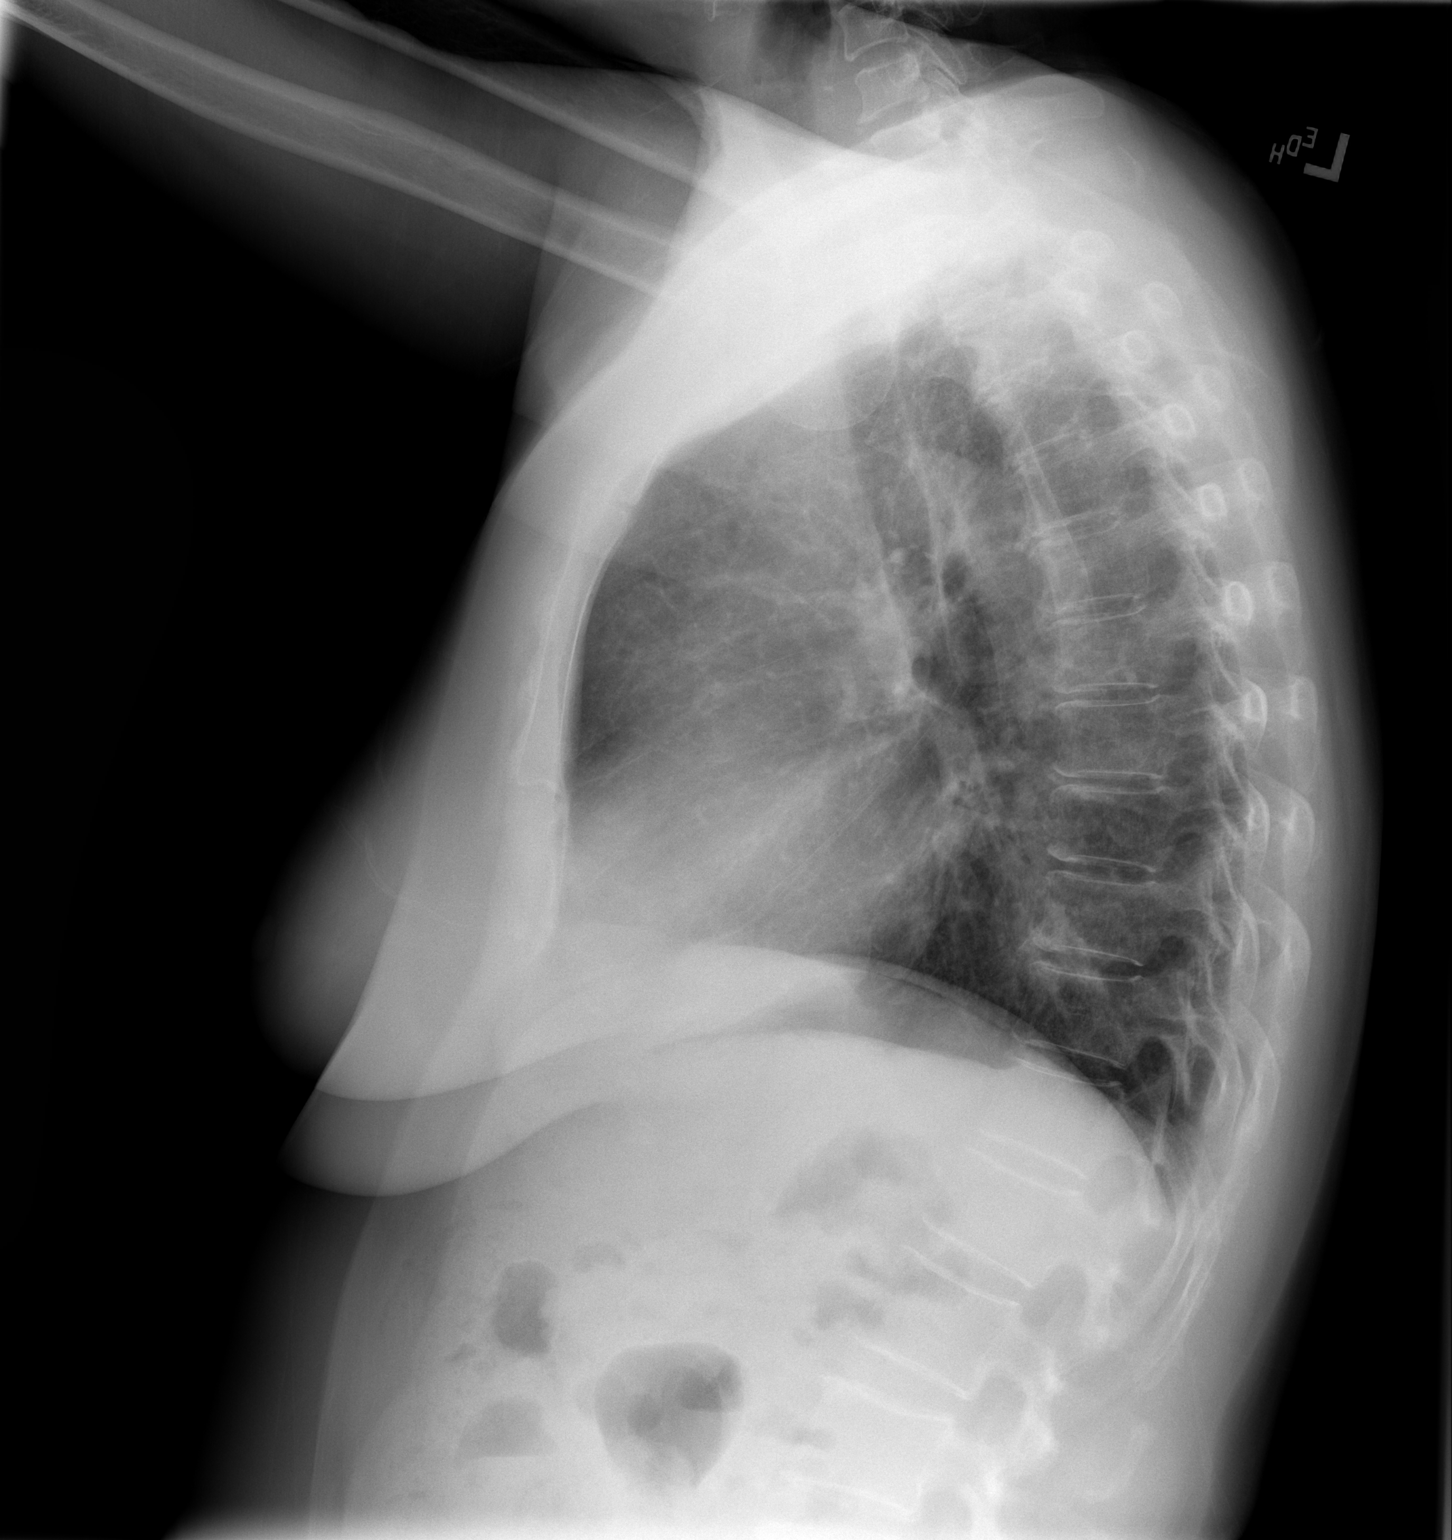

[2 of 2 positions shown; findings below may reference images not displayed]

FINDINGS: Upper normal heart size.

Mediastinal contours and pulmonary vascularity normal.

Lungs clear.

No pulmonary infiltrate, pleural effusion or pneumothorax.

Bones appear mildly demineralized.
IMPRESSION: No acute abnormalities.

## 2018-10-06 ENCOUNTER — Other Ambulatory Visit: Payer: Self-pay | Admitting: Student in an Organized Health Care Education/Training Program

## 2018-10-06 DIAGNOSIS — G4700 Insomnia, unspecified: Secondary | ICD-10-CM

## 2018-10-06 DIAGNOSIS — IMO0001 Reserved for inherently not codable concepts without codable children: Secondary | ICD-10-CM

## 2018-11-13 ENCOUNTER — Other Ambulatory Visit: Payer: Self-pay | Admitting: Student in an Organized Health Care Education/Training Program

## 2018-11-13 ENCOUNTER — Other Ambulatory Visit: Payer: Self-pay | Admitting: Family Medicine

## 2018-11-13 DIAGNOSIS — IMO0001 Reserved for inherently not codable concepts without codable children: Secondary | ICD-10-CM

## 2018-11-13 DIAGNOSIS — G4700 Insomnia, unspecified: Secondary | ICD-10-CM

## 2019-04-07 ENCOUNTER — Other Ambulatory Visit: Payer: Self-pay | Admitting: Family Medicine

## 2019-04-16 ENCOUNTER — Ambulatory Visit: Payer: Medicaid Other | Attending: Internal Medicine

## 2019-04-16 DIAGNOSIS — Z20822 Contact with and (suspected) exposure to covid-19: Secondary | ICD-10-CM

## 2019-04-17 LAB — NOVEL CORONAVIRUS, NAA: SARS-CoV-2, NAA: DETECTED — AB

## 2019-04-18 ENCOUNTER — Telehealth: Payer: Self-pay | Admitting: Physician Assistant

## 2019-04-18 NOTE — Telephone Encounter (Signed)
Called to discuss with Sandra Ryan about Covid symptoms and the use of bamlanivimab, a monoclonal antibody infusion for those with mild to moderate Covid symptoms and at a high risk of hospitalization.     Pt is qualified for this infusion at the [facility] infusion center due to co-morbid conditions and/or a member of an at-risk group, however declines infusion at this time. Symptoms tier reviewed as well as criteria for ending isolation.  Symptoms reviewed that would warrant ED/Hospital evaluation. Preventative practices reviewed. Patient verbalized understanding. Patient advised to call back if he decides that he does want to get infusion. Callback number to the infusion center given. Patient advised to go to Urgent care or ED with severe symptoms. Last date she would be eligible for infusion is 04/21/19. Spoke to patient's daughter who will discuss with her and call me back.    Patient Active Problem List   Diagnosis Date Noted  . Right hip pain 03/27/2018  . Burning with urination 03/27/2018  . Viral respiratory illness 08/04/2017  . Type 2 diabetes mellitus with complication ([place]) [id number]  . Hypercholesteremia 09/24/2016  . Generalized joint pain 09/19/2016  . Depression 03/12/2016  . Osteoarthritis 05/04/2015  . Vitamin B12 nutritional deficiency 12/24/2013  . Peripheral neuropathy 12/22/2013  . Diabetic retinopathy, mild, bilateral 11/11/2012  . High blood pressure associated with diabetes ([place]) 05/24/2012  . GERD (gastroesophageal reflux disease) 01/31/2012  . Cystocele 01/08/2012  . ASCUS (atypical squamous cells of undetermined significance) on Pap smear 11/28/2011  . Cervical polyp 08/27/2011    Angelena Form PA-C  [facility]

## 2019-07-07 ENCOUNTER — Encounter: Payer: Self-pay | Admitting: Family Medicine

## 2019-07-07 ENCOUNTER — Ambulatory Visit (INDEPENDENT_AMBULATORY_CARE_PROVIDER_SITE_OTHER): Payer: Medicare Other | Admitting: Family Medicine

## 2019-07-07 ENCOUNTER — Other Ambulatory Visit: Payer: Self-pay

## 2019-07-07 VITALS — BP 112/70 | HR 99 | Ht 61.61 in | Wt 128.2 lb

## 2019-07-07 DIAGNOSIS — E118 Type 2 diabetes mellitus with unspecified complications: Secondary | ICD-10-CM

## 2019-07-07 DIAGNOSIS — K635 Polyp of colon: Secondary | ICD-10-CM | POA: Diagnosis not present

## 2019-07-07 DIAGNOSIS — K2900 Acute gastritis without bleeding: Secondary | ICD-10-CM

## 2019-07-07 DIAGNOSIS — G4700 Insomnia, unspecified: Secondary | ICD-10-CM

## 2019-07-07 DIAGNOSIS — Z1231 Encounter for screening mammogram for malignant neoplasm of breast: Secondary | ICD-10-CM

## 2019-07-07 LAB — POCT URINALYSIS DIP (MANUAL ENTRY)
Bilirubin, UA: NEGATIVE
Blood, UA: NEGATIVE
Glucose, UA: 500 mg/dL — AB
Ketones, POC UA: NEGATIVE mg/dL
Leukocytes, UA: NEGATIVE
Nitrite, UA: NEGATIVE
Protein Ur, POC: NEGATIVE mg/dL
Spec Grav, UA: 1.01 (ref 1.010–1.025)
Urobilinogen, UA: 0.2 E.U./dL
pH, UA: 6 (ref 5.0–8.0)

## 2019-07-07 LAB — POCT GLYCOSYLATED HEMOGLOBIN (HGB A1C): HbA1c, POC (controlled diabetic range): 10 % — AB (ref 0.0–7.0)

## 2019-07-07 MED ORDER — LINAGLIPTIN 5 MG PO TABS: 5.0000 mg | ORAL_TABLET | Freq: Every day | ORAL | 11 refills | Status: DC

## 2019-07-07 MED ORDER — ACCU-CHEK GUIDE W/DEVICE KIT: 1.0000 | PACK | Freq: Every day | 0 refills | Status: DC

## 2019-07-07 MED ORDER — ACCU-CHEK GUIDE VI STRP: ORAL_STRIP | 12 refills | Status: DC

## 2019-07-07 MED ORDER — ACCU-CHEK SOFTCLIX LANCETS MISC: 12 refills | Status: DC

## 2019-07-07 MED ORDER — OMEPRAZOLE 20 MG PO CPDR: 20.0000 mg | DELAYED_RELEASE_CAPSULE | Freq: Every day | ORAL | 3 refills | Status: DC

## 2019-07-07 MED ORDER — EMPAGLIFLOZIN 10 MG PO TABS: 10.0000 mg | ORAL_TABLET | Freq: Every day | ORAL | 3 refills | Status: DC

## 2019-07-07 MED ORDER — TRAZODONE HCL 50 MG PO TABS: ORAL_TABLET | ORAL | 3 refills | Status: DC

## 2019-07-07 NOTE — Progress Notes (Signed)
    SUBJECTIVE:   Patient preferred to use her son as interpreter today.  CHIEF COMPLAINT / HPI:   Type 2 Diabetes Mellitus: CBGs: Does not check, needs a new glucometer today Medications: Reports that she takes Januvia 100 mg daily, metformin 1,000 BID, however patient did not bring her medications today Hypo/hyperglycemic symptoms: More frequent urination Diet and exercise: Varied diet, stay somewhat active  Burning in her stomach Patient reports that she will feel burning pain in her stomach, especially when she is hungry.  She takes famotidine, but continues to have this issue.  She has normal bowel movements and no nausea.  Health maintenance Is overdue for mammogram and colonoscopy.  Got a colonoscopy in 2014 and was told to return in 5 years for follow-up.  Reports that she has tried to make an appointment to get a colonoscopy, but they have told her that they need a referral from her doctor for this.   PERTINENT  PMH / PSH: Type 2 diabetes, depression, generalized joint pain, hyperlipidemia  OBJECTIVE:   BP 112/70   Pulse 99   Ht 5' 1.61" (1.565 m)   Wt 128 lb 3.2 oz (58.2 kg)   SpO2 99%   BMI 23.74 kg/m   General: well appearing, appears stated age Cardiac: RRR, no MRG Respiratory: CTAB, no rhonchi, rales, or wheezing, normal work of breathing Skin: no rashes or other lesions, warm and well perfused Psych: appropriate mood and affect   ASSESSMENT/PLAN:   Type 2 diabetes mellitus with complication (HCC) Worsening control today with an A1c of 10.0.  I have refilled patient's Metformin, and it appears that [place] is now on her pharmacies formulary, so we will stop Januvia and start [place].  Since she has worsening control of her diabetes, will also start Jardiance 5 mg.  Courage patient's son to call if patient has any questions or concerns or difficulties with her medications.  We will obtain urine microalbumin, lipid panel, and BMP today.  We will follow-up in  3 months.  Encounter for screening mammogram for malignant neoplasm of breast Mammogram ordered today, and patient son was given the number for [facility] imaging to make an appointment.  Patient was counseled on the importance of getting a mammogram at least every other year.  Sessile colonic polyp New referral to GI was placed today, and patient son was encouraged to make her an appointment for this as soon as possible.  Acute gastritis Will prescribe a trial of omeprazole to see if this will help resolve patient's gastritis.     Kathrene Alu, MD [place]

## 2019-07-07 NOTE — Patient Instructions (Signed)
It was nice meeting you today Ms. Holck!  Your diabetes is not well controlled today unfortunately.  We are switching your Januvia to Tradjenta since this seems to be better covered by your insurance.  We are also going to add a medication called Jardiance.  Please take this once a day.  I would like for you to come back in 3 months so that we can see how you are doing.  Please call us if you have any issues with these medications.  I have also ordered a new blood sugar meter for you.  For your stomach burning, please take Prilosec once per day.  Hopefully this will go away after taking this medication for a couple of months.  I am refilling your trazodone, which hopefully will help you to sleep better.  Please call and make an appointment to get a mammogram performed.  Please also call and schedule your colonoscopy.  I have placed the referral for both of these today.  If you have any questions or concerns, please feel free to call the clinic.   Be well,  Dr. Shan Levans

## 2019-07-08 DIAGNOSIS — K2900 Acute gastritis without bleeding: Secondary | ICD-10-CM | POA: Insufficient documentation

## 2019-07-08 DIAGNOSIS — Z1231 Encounter for screening mammogram for malignant neoplasm of breast: Secondary | ICD-10-CM | POA: Insufficient documentation

## 2019-07-08 DIAGNOSIS — K635 Polyp of colon: Secondary | ICD-10-CM | POA: Insufficient documentation

## 2019-07-08 LAB — BASIC METABOLIC PANEL
BUN/Creatinine Ratio: 9 — ABNORMAL LOW (ref 12–28)
BUN: 7 mg/dL — ABNORMAL LOW (ref 8–27)
CO2: 23 mmol/L (ref 20–29)
Calcium: 9.5 mg/dL (ref 8.7–10.3)
Chloride: 101 mmol/L (ref 96–106)
Creatinine, Ser: 0.78 mg/dL (ref 0.57–1.00)
GFR calc Af Amer: 92 mL/min/{1.73_m2} (ref >59)
GFR calc non Af Amer: 80 mL/min/{1.73_m2} (ref >59)
Glucose: 291 mg/dL — ABNORMAL HIGH (ref 65–99)
Potassium: 4.9 mmol/L (ref 3.5–5.2)
Sodium: 139 mmol/L (ref 134–144)

## 2019-07-08 LAB — LIPID PANEL
Chol/HDL Ratio: 5.4 ratio — ABNORMAL HIGH (ref 0.0–4.4)
Cholesterol, Total: 205 mg/dL — ABNORMAL HIGH (ref 100–199)
HDL: 38 mg/dL — ABNORMAL LOW (ref >39)
LDL Chol Calc (NIH): 110 mg/dL — ABNORMAL HIGH (ref 0–99)
Triglycerides: 330 mg/dL — ABNORMAL HIGH (ref 0–149)
VLDL Cholesterol Cal: 57 mg/dL — ABNORMAL HIGH (ref 5–40)

## 2019-07-08 NOTE — Assessment & Plan Note (Signed)
Mammogram ordered today, and patient son was given the number for [facility] imaging to make an appointment.  Patient was counseled on the importance of getting a mammogram at least every other year.

## 2019-07-08 NOTE — Assessment & Plan Note (Addendum)
Worsening control today with an A1c of 10.0.  I have refilled patient's Metformin, and it appears that Sandra Ryan is now on her pharmacies formulary, so we will stop Januvia and start [place].  Since she has worsening control of her diabetes, will also start Jardiance 5 mg.  Courage patient's son to call if patient has any questions or concerns or difficulties with her medications.  We will obtain urine microalbumin, lipid panel, and BMP today.  We will follow-up in 3 months.

## 2019-07-08 NOTE — Assessment & Plan Note (Signed)
New referral to GI was placed today, and patient son was encouraged to make her an appointment for this as soon as possible.

## 2019-07-08 NOTE — Assessment & Plan Note (Signed)
Will prescribe a trial of omeprazole to see if this will help resolve patient's gastritis.

## 2019-07-09 LAB — MICROALBUMIN / CREATININE URINE RATIO
Creatinine, Urine: 25.6 mg/dL
Microalb/Creat Ratio: <12 mg/g creat (ref 0–29)
Microalbumin, Urine: <3 ug/mL

## 2019-07-16 ENCOUNTER — Other Ambulatory Visit: Payer: Self-pay | Admitting: Family Medicine

## 2019-07-16 DIAGNOSIS — E1169 Type 2 diabetes mellitus with other specified complication: Secondary | ICD-10-CM

## 2019-07-16 MED ORDER — ATORVASTATIN CALCIUM 80 MG PO TABS: 80.0000 mg | ORAL_TABLET | Freq: Every day | ORAL | 3 refills | Status: DC

## 2019-07-16 NOTE — Progress Notes (Signed)
Attempted to contact patient via a [place] interpreter, but none were available over the phone.  I called the patient's number, and her daughter, who speaks [place], answered.  I relayed to her daughter that her cholesterol is high, but it can be treated with atorvastatin.  I have called this medication in and given her a 90-day supply with 3 refills, so she has a year supply of medication total.  Her daughter expressed understanding and will relay this message to her mother.

## 2019-09-06 ENCOUNTER — Other Ambulatory Visit: Payer: Self-pay | Admitting: Family Medicine

## 2019-10-05 ENCOUNTER — Encounter: Payer: Self-pay | Admitting: Family Medicine

## 2019-10-05 ENCOUNTER — Ambulatory Visit (INDEPENDENT_AMBULATORY_CARE_PROVIDER_SITE_OTHER): Payer: Medicare Other | Admitting: Family Medicine

## 2019-10-05 ENCOUNTER — Other Ambulatory Visit: Payer: Self-pay

## 2019-10-05 VITALS — BP 116/62 | HR 94 | Ht 62.0 in | Wt 128.0 lb

## 2019-10-05 DIAGNOSIS — E118 Type 2 diabetes mellitus with unspecified complications: Secondary | ICD-10-CM | POA: Diagnosis present

## 2019-10-05 LAB — POCT GLYCOSYLATED HEMOGLOBIN (HGB A1C): HbA1c, POC (controlled diabetic range): 10.8 % — AB (ref 0.0–7.0)

## 2019-10-05 MED ORDER — EMPAGLIFLOZIN 25 MG PO TABS
25.0000 mg | ORAL_TABLET | Freq: Every day | ORAL | 3 refills | Status: DC
Start: 2019-10-05 — End: 2019-12-13

## 2019-10-05 NOTE — Patient Instructions (Addendum)
Great to see you today Sandra Ryan.  Please stop taking the TRADJENTA. Continue Metformin 1000mg  twice daily Increase Jardiance to 25mg  once daily. You can collect these pills from your pharmacy.   Please take your blood sugars at home. Please also come and see me in 6 months time.  Best wishes,  Dr Posey Pronto

## 2019-10-05 NOTE — Progress Notes (Signed)
    SUBJECTIVE:   CHIEF COMPLAINT / HPI:   Sandra Ryan is 64 year old female who presents today for diabetes follow-up. Patient was accompanied by her son who acted as a Publishing rights manager.  Diabetes Last A1c was 03 July 2019 and was instructed to take Metformin 1000 mg twice daily, Jardiance 10 mg and to stop Januvia (changes in formulary) and start Tradjenta 5mg . Both patient and son are unsure of patient's diabetic regimen. She thinks she takes the Metformin twice daily and also takes [place]. She tried the Tradjenta but it gave her reflux symptoms and she stopped taking this and restarted the Januvia pills that she had left. She does not check her blood sugars at home but she used to have a glucometer.  PERTINENT  PMH / PSH: Diabetes, hypertension  OBJECTIVE:   BP 116/62   Pulse 94   Ht 5\' 2"  (1.575 m)   Wt 128 lb (58.1 kg)   SpO2 97%   BMI 23.41 kg/m   General: Alert, no acute distress, pleasant Cardio: Well-perfused Pulm: Normal respiratory effort, speaking in full sentences Neuro: Cranial nerves grossly intact  ASSESSMENT/PLAN:   Type 2 diabetes mellitus with complication (HCC) P9K today 10.9 which is worsened from 10 in April 2021.  Patient's compliance understanding of her disease is a barrier to her diabetes management.  Today I simplified her diabetic medications so that it is easier for her to take them, I also explained this to her son. -Continue Metformin 1000 mg twice daily -Increase Jardiance to 25 mg daily -Stop Tradjenta and stop Januvia -Follow-up with me in 4 weeks for CBG compliance check.  -A1c recheck in 3 months.     [name], MD [place]

## 2019-10-05 NOTE — Assessment & Plan Note (Signed)
A1c today 10.9 which is worsened from 10 in April 2021.  Patient's compliance understanding of her disease is a barrier to her diabetes management.  Today I simplified her diabetic medications so that it is easier for her to take them, I also explained this to her son. -Continue Metformin 1000 mg twice daily -Increase Jardiance to 25 mg daily -Stop Tradjenta and stop Januvia -Follow-up with me in 4 weeks for CBG compliance check.  -A1c recheck in 3 months.

## 2019-12-09 ENCOUNTER — Other Ambulatory Visit: Payer: Self-pay

## 2019-12-09 MED ORDER — METFORMIN HCL 1000 MG PO TABS: ORAL_TABLET | ORAL | 0 refills | Status: DC

## 2019-12-13 ENCOUNTER — Other Ambulatory Visit: Payer: Self-pay

## 2019-12-13 ENCOUNTER — Ambulatory Visit (INDEPENDENT_AMBULATORY_CARE_PROVIDER_SITE_OTHER): Payer: Medicare Other | Admitting: Family Medicine

## 2019-12-13 VITALS — BP 122/72 | HR 97 | Wt 124.8 lb

## 2019-12-13 DIAGNOSIS — E118 Type 2 diabetes mellitus with unspecified complications: Secondary | ICD-10-CM | POA: Diagnosis not present

## 2019-12-13 LAB — GLUCOSE, POCT (MANUAL RESULT ENTRY): POC Glucose: 273 mg/dl — AB (ref 70–99)

## 2019-12-13 MED ORDER — METFORMIN HCL 1000 MG PO TABS: 1000.0000 mg | ORAL_TABLET | Freq: Two times a day (BID) | ORAL | 3 refills | Status: DC

## 2019-12-13 MED ORDER — DICLOFENAC SODIUM 1 % EX GEL: 4.0000 g | Freq: Four times a day (QID) | CUTANEOUS | 0 refills | Status: DC

## 2019-12-13 NOTE — Patient Instructions (Addendum)
Great to see you today!! Stop taking the Jardiance and continue Metformin only. We will check your diabetes level next month and consider starting a new medication which is an injection for diabetes.  Please follow up with me in 1 months time.  Best wishes,  Dr Posey Pronto

## 2019-12-13 NOTE — Progress Notes (Signed)
    SUBJECTIVE:   CHIEF COMPLAINT / HPI:   Cleotha Whitacre is a 64 yr old female who presents with her son for a diabetic follow up. Her son was present today.  Nepali interpretor used for this encounter.  Diabetes Pt is experiencing dry mouth and dry throat from Jardiance 25mg  and she is taking it daily. She would like to stop this medication and start her previous diabetic medication but she is unsure of the name. Denies nocturia and dysuria. She is taking Metformin 1000mg  BID and tolerating this well. She would like a refill. Does not measure CBGs at home. Denies blurred vision, abdominal pain, polyuria or polydipsia.    PERTINENT  PMH / PSH: DM, HTN   OBJECTIVE:   BP 122/72   Pulse 97   Wt 124 lb 12.8 oz (56.6 kg)   SpO2 98%   BMI 22.83 kg/m    General: Alert, pleasant, no acute distress Cardio: Normal S1 and S2, RRR Pulm: CTAB, Normal respiratory effort  Extremities: No peripheral edema.  Neuro: Cranial nerves grossly intact  ASSESSMENT/PLAN:   Type 2 diabetes mellitus with complication (HCC) CBG today 272. A1c is not due for another month. Precepted patient with Dr Valentina Lucks regarding management. Language barrier limits patient's understanding of condition and therefore management of her medications. He recommended to stop Jardiance today and follow up in 1 month with A1c. Can then consider starting GLP 1 agonist such as Trulicity or Victoza which is covered on Medicaid. Explained plan to patient and son who expressed understanding. Continue only Metformin for now. Follow up with me in 1 month.     [name], MD PGY-2 [place]

## 2019-12-15 NOTE — Assessment & Plan Note (Addendum)
CBG today 272. A1c is not due for another month. Precepted patient with Dr Valentina Lucks regarding management. Language barrier limits patient's understanding of condition and therefore management of her medications. He recommended to stop Jardiance today and follow up in 1 month with A1c. Can then consider starting GLP 1 agonist such as Trulicity or Victoza which is covered on Medicaid. Explained plan to patient and son who expressed understanding. Continue only Metformin for now. Follow up with me in 1 month.

## 2020-01-31 NOTE — Progress Notes (Addendum)
    SUBJECTIVE:   CHIEF COMPLAINT / HPI:   Sandra Ryan is a 64 yr old female who presents today for follow up. Son was also present today.  Nepali interpretor used for this visit.  Diabetes Last A1c was 10.7. Pt takes metformin 1000mg  BID daily. Previously was taking Jardiance which she stopped due to side effects. She now feels worse since her last visit. Endorses abdominal pain, polydipsia, dizziness which keeps her up at night. Does not measure CBGs at home as does not have a glucometer. Does have a diabetic eye doctor but has not seen them yet this year.  PERTINENT  PMH / PSH: Diabetes, HTN  OBJECTIVE:   BP 124/68   Pulse 91   Ht 5\' 2"  (1.575 m)   Wt 127 lb 6.4 oz (57.8 kg)   SpO2 97%   BMI 23.30 kg/m    General: Alert, well appearing, no acute distress Cardio: Normal S1 and S2, RRR Pulm: CTAB, normal WOB Abdomen: Bowel sounds normal. Abdomen soft and non-tender.  Extremities: No peripheral edema. Warm/ well perfused.  Neuro: Cranial nerves grossly intact  ASSESSMENT/PLAN:   Diabetes (HCC) A1c 11.1 worsened from 10.8 at last visit. Pt reports compliance with Metformin. Considered starting insulin but patient declined due to previous side effects. She is happy to try GLP 1 agonist. No Trulicity samples in clinic today. Arranged follow up with Dr Valentina Lucks on Thursday 11th November for further diabetes management and teaching. She can start Ozempic. Follow up in 1 month to see how she is getting on Ozempic. Obtained BMP today given A1c is so high. Also referred to chronic care management as patient has poor health literacy and understanding her conditions.  Healthcare maintenance Referred to GI for colonoscopy.     [name], MD PGY-2 [place]

## 2020-02-01 ENCOUNTER — Other Ambulatory Visit: Payer: Self-pay

## 2020-02-01 ENCOUNTER — Encounter: Payer: Self-pay | Admitting: Family Medicine

## 2020-02-01 ENCOUNTER — Ambulatory Visit (INDEPENDENT_AMBULATORY_CARE_PROVIDER_SITE_OTHER): Payer: Medicare Other | Admitting: Family Medicine

## 2020-02-01 VITALS — BP 124/68 | HR 91 | Ht 62.0 in | Wt 127.4 lb

## 2020-02-01 DIAGNOSIS — E118 Type 2 diabetes mellitus with unspecified complications: Secondary | ICD-10-CM | POA: Diagnosis present

## 2020-02-01 DIAGNOSIS — Z0000 Encounter for general adult medical examination without abnormal findings: Secondary | ICD-10-CM | POA: Diagnosis not present

## 2020-02-01 LAB — POCT GLYCOSYLATED HEMOGLOBIN (HGB A1C): Hemoglobin A1C: 11.1 % — AB (ref 4.0–5.6)

## 2020-02-01 MED ORDER — TRULICITY 0.75 MG/0.5ML ~~LOC~~ SOAJ
0.7500 mg | SUBCUTANEOUS | 0 refills | Status: DC
Start: 2020-02-01 — End: 2020-02-24

## 2020-02-01 NOTE — Patient Instructions (Addendum)
Great to see you today! Your diabetes in unfortunately worsening. We are prescribing you a new medication called Trulicity, this is not the same as insulin. We did not have samples in the clinci   You will see Dr Valentina Lucks who is the pharmacist on Thursday this week for further teaching on how to use the injections. We are also checking your kidney function today. I will call you if the results are abnormal.  Best wishes,  Dr Posey Pronto

## 2020-02-02 LAB — BASIC METABOLIC PANEL
BUN/Creatinine Ratio: 11 — ABNORMAL LOW (ref 12–28)
BUN: 8 mg/dL (ref 8–27)
CO2: 26 mmol/L (ref 20–29)
Calcium: 9.6 mg/dL (ref 8.7–10.3)
Chloride: 98 mmol/L (ref 96–106)
Creatinine, Ser: 0.76 mg/dL (ref 0.57–1.00)
GFR calc Af Amer: 95 mL/min/{1.73_m2} (ref >59)
GFR calc non Af Amer: 83 mL/min/{1.73_m2} (ref >59)
Glucose: 343 mg/dL — ABNORMAL HIGH (ref 65–99)
Potassium: 4.8 mmol/L (ref 3.5–5.2)
Sodium: 136 mmol/L (ref 134–144)

## 2020-02-02 NOTE — Assessment & Plan Note (Signed)
Referred to GI for colonoscopy 

## 2020-02-02 NOTE — Assessment & Plan Note (Addendum)
A1c 11.1 worsened from 10.8 at last visit. Pt reports compliance with Metformin. Considered starting insulin but patient declined due to previous side effects. She is happy to try GLP 1 agonist. No Trulicity samples in clinic today. Arranged follow up with Dr Valentina Lucks on Thursday 11th November for further diabetes management and teaching. She can start Ozempic. Follow up in 1 month to see how she is getting on Ozempic. Obtained BMP today given A1c is so high. Also referred to chronic care management as patient has poor health literacy and understanding her conditions.

## 2020-02-03 ENCOUNTER — Ambulatory Visit (INDEPENDENT_AMBULATORY_CARE_PROVIDER_SITE_OTHER): Payer: Medicare Other | Admitting: Pharmacist

## 2020-02-03 ENCOUNTER — Encounter: Payer: Self-pay | Admitting: Pharmacist

## 2020-02-03 ENCOUNTER — Other Ambulatory Visit: Payer: Self-pay

## 2020-02-03 VITALS — BP 124/74 | HR 78 | Ht 60.2 in | Wt 126.4 lb

## 2020-02-03 DIAGNOSIS — E119 Type 2 diabetes mellitus without complications: Secondary | ICD-10-CM | POA: Diagnosis not present

## 2020-02-03 DIAGNOSIS — K219 Gastro-esophageal reflux disease without esophagitis: Secondary | ICD-10-CM | POA: Diagnosis not present

## 2020-02-03 DIAGNOSIS — E1169 Type 2 diabetes mellitus with other specified complication: Secondary | ICD-10-CM | POA: Diagnosis not present

## 2020-02-03 DIAGNOSIS — E785 Hyperlipidemia, unspecified: Secondary | ICD-10-CM

## 2020-02-03 DIAGNOSIS — E118 Type 2 diabetes mellitus with unspecified complications: Secondary | ICD-10-CM | POA: Diagnosis not present

## 2020-02-03 MED ORDER — ATORVASTATIN CALCIUM 80 MG PO TABS: 80.0000 mg | ORAL_TABLET | Freq: Every day | ORAL | 3 refills | Status: DC

## 2020-02-03 MED ORDER — ACCU-CHEK SOFTCLIX LANCETS MISC: 12 refills | Status: DC

## 2020-02-03 MED ORDER — ACCU-CHEK GUIDE VI STRP: ORAL_STRIP | 12 refills | Status: DC

## 2020-02-03 MED ORDER — OMEPRAZOLE 20 MG PO CPDR: 20.0000 mg | DELAYED_RELEASE_CAPSULE | Freq: Every day | ORAL | 3 refills | Status: DC

## 2020-02-03 MED ORDER — ACCU-CHEK GUIDE W/DEVICE KIT: 1.0000 | PACK | Freq: Every day | 0 refills | Status: DC

## 2020-02-03 NOTE — Assessment & Plan Note (Signed)
Diabetes longstanding currently uncontrolled with A1C 11.1%. Unable to assess home sugars as patient does not have glucometer. Medication adherence appears optimal with metformin twice daily. Discussed Trulicity's clinical benefits, potential side effects, and proper injection technique with teach-back method. Patient verbalized understanding. Patient self-administered first dose in clinic. Patient is able to verbalize appropriate hypoglycemia management plan. Discussed eating a healthy diet consisting of vegetables, fruit and lean meats (chicken, [place], fish), and to limit salt and sugar intake. -Started GLP-1 Trulicity (generic name dulaglutide) 0.75 mg weekly (administered first dose in office).  -Continued metformin 1000 mg BID -Sent prescription of Accu-Chec glucometer and materials to pharmacy -Extensively discussed pathophysiology of diabetes, recommended lifestyle interventions, dietary effects on blood sugar control -Counseled on s/sx of and management of hypoglycemia

## 2020-02-03 NOTE — Patient Instructions (Addendum)
It was nice to see you today!  Your goal blood sugar is 80-130 before eating and less than 180 after eating.  Medication Changes: Begin taking Trulicity 6.24 mg weekly  Begin taking atorvastatin 80 mg daily in the evenings  Begin taking omeprazole 20 mg once daily - this will help with heartburn/indigestion/stomach upset  Continue metformin 1000 mg twice daily  Monitor blood sugars at home and keep a log (glucometer or piece of paper) to bring with you to your next visit.  Keep up the good work with diet and exercise. Aim for a diet full of vegetables, fruit and lean meats (chicken, [place], fish). Try to limit salt intake by eating fresh or frozen vegetables (instead of canned), rinse canned vegetables prior to cooking and do not add any additional salt to meals.    See you in 3 weeks!

## 2020-02-03 NOTE — Progress Notes (Signed)
S:     Chief Complaint  Patient presents with  . Medication Management    Trulicity Teaching    Patient arrives in good spirits accompanied with her son as interpreter. Presents for diabetes evaluation, education, and management. Patient was referred and last seen by Primary Care Provider on 69/6/29 at which Trulicity 5.28 mg weekly was started.   Today, patient brought Trulicity pen to clinic for injection training. Reports no issues obtaining medication from [facility]. Reports she does not have a glucometer and unable to check BG. Reports nocturia 2-3 times/night and neuropathy. Denies symptomatic hypoglycemia.   Family/Social History: -Tobacco use: former smoker  Human resources officer affordability: Medicare and Medicaid  Medication adherence reported good.   Current diabetes medications include: Trulicity 4.13 mg weekly (haven't started), metformin 1000 mg BID Current hypertension medications include: none Current hyperlipidemia medications include: atorvastatin 80 mg daily (not taking)  Patient denies hypoglycemic events.  Patient reported dietary habits: Eats 2-3 meals/day Lunch: rice and vegetables, lintell soup Drinks: juice   Patient reports nocturia (nighttime urination).  Patient reports neuropathy (nerve pain).  O:  Physical Exam Vitals reviewed.  Constitutional:      Appearance: Normal appearance.  Neurological:     General: No focal deficit present.     Mental Status: She is alert and oriented to person, place, and time.  Psychiatric:        Mood and Affect: Mood normal.        Behavior: Behavior normal.        Thought Content: Thought content normal.    Review of Systems  All other systems reviewed and are negative.  Lab Results  Component Value Date   HGBA1C 11.1 (A) 02/01/2020   There were no vitals filed for this visit.  Lipid Panel     Component Value Date/Time   CHOL 205 (H) 07/07/2019 1532   TRIG 330 (H) 07/07/2019  1532   HDL 38 (L) 07/07/2019 1532   CHOLHDL 5.4 (H) 07/07/2019 1532   CHOLHDL 4.7 05/04/2015 1057   VLDL 47 (H) 05/04/2015 1057   LDLCALC 110 (H) 07/07/2019 1532   LDLDIRECT 139 (H) 01/31/2012 1634    Home fasting blood sugars: none 2 hour post-meal/random blood sugars: none  Clinical Atherosclerotic Cardiovascular Disease (ASCVD): No  The 10-year ASCVD risk score Mikey Bussing DC Jr., et al., 2013) is: 11.7%   Values used to calculate the score:     Age: 64 years     Sex: Female     Is Non-Hispanic African American: No     Diabetic: Yes     Tobacco smoker: No     Systolic Blood Pressure: 244 mmHg     Is BP treated: No     HDL Cholesterol: 38 mg/dL     Total Cholesterol: 205 mg/dL    A/P: Diabetes longstanding currently uncontrolled with A1C 11.1%. Unable to assess home sugars as patient does not have glucometer. Medication adherence appears optimal with metformin twice daily. Discussed Trulicity's clinical benefits, potential side effects, and proper injection technique with teach-back method. Patient verbalized understanding. Patient self-administered first dose in clinic. Patient is able to verbalize appropriate hypoglycemia management plan. Discussed eating a healthy diet consisting of vegetables, fruit and lean meats (chicken, [place], fish), and to limit salt and sugar intake. -Started GLP-1 Trulicity (generic name dulaglutide) 0.75 mg weekly (administered first dose in office).  -Continued metformin 1000 mg BID -Sent prescription of Accu-Chec glucometer and materials to pharmacy -Extensively discussed pathophysiology of  diabetes, recommended lifestyle interventions, dietary effects on blood sugar control -Counseled on s/sx of and management of hypoglycemia -Next A1C anticipated February 2021.   ASCVD risk - primary prevention in patient with diabetes. Last LDL is not controlled. A -Continued atorvastatin 80 mg daily  Written patient instructions provided.  Total time in face to  face counseling 45 minutes.   Follow up Pharmacist Clinic Visit in 3 weeks. Patient seen with Adria Dill, PharmD Candidate, Dimple Nanas, PharmD - PGY-1 Resident and Lorel Monaco, PharmD, PGY2 Pharmacy Resident.

## 2020-02-04 NOTE — Progress Notes (Signed)
Reviewed: I agree with Dr. Koval's documentation and management. 

## 2020-02-08 ENCOUNTER — Telehealth: Payer: Self-pay | Admitting: *Deleted

## 2020-02-08 DIAGNOSIS — E118 Type 2 diabetes mellitus with unspecified complications: Secondary | ICD-10-CM

## 2020-02-08 NOTE — Telephone Encounter (Signed)
Received fax requesting specific directions for insurance on the pts Rx for Accu-Chek Softclix Lancets. Routing to PCP and Dr. Valentina Lucks since he sent it in.Shenelle Klas Zimmerman Rumple, CMA

## 2020-02-09 MED ORDER — ACCU-CHEK SOFTCLIX LANCETS MISC: 12 refills | Status: DC

## 2020-02-09 NOTE — Addendum Note (Signed)
Addended by: Leavy Cella on: 02/09/2020 12:02 PM   Modules accepted: Orders

## 2020-02-10 MED ORDER — ACCU-CHEK SOFTCLIX LANCETS MISC: 11 refills | Status: DC

## 2020-02-10 NOTE — Telephone Encounter (Signed)
Sent new prescription for ONCE daily testing lancet supply with Dx code of:  E11.9  11 refills sent.

## 2020-02-10 NOTE — Telephone Encounter (Signed)
Received phone call from pharmacy that insurance will only cover for once daily testing. Rx needs to be sent over with specific instructions and diagnosis code. I see that prescriber was requesting twice daily testing.   Please advise if rx can be updated to reflect insurance guidelines.   To PCP  Talbot Grumbling, RN

## 2020-02-24 ENCOUNTER — Encounter: Payer: Self-pay | Admitting: Pharmacist

## 2020-02-24 ENCOUNTER — Ambulatory Visit (INDEPENDENT_AMBULATORY_CARE_PROVIDER_SITE_OTHER): Payer: Medicare Other | Admitting: Pharmacist

## 2020-02-24 ENCOUNTER — Other Ambulatory Visit: Payer: Self-pay

## 2020-02-24 VITALS — BP 120/70 | HR 99 | Ht 61.2 in | Wt 128.0 lb

## 2020-02-24 DIAGNOSIS — Z23 Encounter for immunization: Secondary | ICD-10-CM | POA: Diagnosis present

## 2020-02-24 DIAGNOSIS — E118 Type 2 diabetes mellitus with unspecified complications: Secondary | ICD-10-CM | POA: Diagnosis not present

## 2020-02-24 DIAGNOSIS — E119 Type 2 diabetes mellitus without complications: Secondary | ICD-10-CM

## 2020-02-24 MED ORDER — TRULICITY 1.5 MG/0.5ML ~~LOC~~ SOAJ: 1.5000 mg | SUBCUTANEOUS | 11 refills | Status: DC

## 2020-02-24 MED ORDER — ACCU-CHEK GUIDE VI STRP: ORAL_STRIP | 12 refills | Status: DC

## 2020-02-24 NOTE — Progress Notes (Signed)
S:     Chief Complaint  Patient presents with  . Medication Management    Diabetes    Patient arrives in good spirits, ambulating without assistance and accompanied by her son. Presents for diabetes evaluation, education, and management following initiation of Trulicity weekly injection.  Patient was referred and last seen by Primary Care Provider on 02/01/20.  Today, patient reports medication adherence with metformin twice daily and Trulicity 8.92 mg weekly (injected 4th dose prior to visit this morning). Reports burning sensation immediately after Trulicity injections that resolve in seconds. Denies GI issues. Reports difficulty obtaining glucometer from pharmacy. Denies symptomatic hypoglycemia.   Family/Social History: -Tobacco use: former smoker  Human resources officer affordability: Medicare and Medicaid  Medication adherence reported good. Current diabetes medications include: Trulicity 1.19 mg weekly, metformin 1000 mg BID Current hypertension medications include: none Current hyperlipidemia medications include: atorvastatin 80 mg daily  Patient denies hypoglycemic events.  Patient reported dietary habits: Eats 2-3 meals/day Lunch: rice and vegetables, lintell soup Drinks: juice   Patient reports nocturia (nighttime urination). (2-3x/night) Patient reports neuropathy (nerve pain). Patient reports visual changes (teary eyes) Patient reports self foot exams.     O:  Physical Exam Vitals reviewed.  Constitutional:      Appearance: Normal appearance.  Neurological:     General: No focal deficit present.     Mental Status: She is alert and oriented to person, place, and time.  Psychiatric:        Mood and Affect: Mood normal.        Thought Content: Thought content normal.    Review of Systems  All other systems reviewed and are negative.  Lab Results  Component Value Date   HGBA1C 11.1 (A) 02/01/2020   Vitals:   02/24/20 1103  BP: 120/70    Pulse: 99  SpO2: 99%    Lipid Panel     Component Value Date/Time   CHOL 205 (H) 07/07/2019 1532   TRIG 330 (H) 07/07/2019 1532   HDL 38 (L) 07/07/2019 1532   CHOLHDL 5.4 (H) 07/07/2019 1532   CHOLHDL 4.7 05/04/2015 1057   VLDL 47 (H) 05/04/2015 1057   LDLCALC 110 (H) 07/07/2019 1532   LDLDIRECT 139 (H) 01/31/2012 1634    Home fasting blood sugars: none  2 hour post-meal/random blood sugars: none.  Clinical Atherosclerotic Cardiovascular Disease (ASCVD): No  The 10-year ASCVD risk score Mikey Bussing DC Jr., et al., 2013) is: 11%   Values used to calculate the score:     Age: 64 years     Sex: Female     Is Non-Hispanic African American: No     Diabetic: Yes     Tobacco smoker: No     Systolic Blood Pressure: 417 mmHg     Is BP treated: No     HDL Cholesterol: 38 mg/dL     Total Cholesterol: 205 mg/dL    A/P: Diabetes longstanding currently uncontrolled with A1C 11.1%. Unable to assess home sugars as patient does not have glucometer yet.  Medication adherence appears optimal. Patient is able to verbalize appropriate hypoglycemia management plan. -Increased Trulicity (generic name dulaglutide) from 0.75 mg to 1.5 mg weekly -Continued metformin 1000 mg BID -Extensively discussed pathophysiology of diabetes, recommended lifestyle interventions, dietary effects on blood sugar control -Counseled on s/sx of and management of hypoglycemia -Next A1C anticipated February 2021.  After clinic visit, contacted [place] regarding issue dispensing glucometer. Insurance will not cover glucometer since it has been dispensed within the  last 5 years, but will be covered starting February 2022. Lancets and tests are available now with $0 copay. Left voicemail with Curt Jews (patient's son) with this information.    ASCVD risk - primary prevention in patient with diabetes. Last LDL is not controlled. Patient restarted atorvastatin at last visit on 02/03/20. -Continued atorvastatin 80 mg  daily   Health Maintenance - patient requested influenza vaccine -Flu shot was administered by Cherrie Distance, CMA  Written patient instructions provided.  Total time in face to face counseling 45 minutes.   Follow up PCP Clinic Visit in 1 month. Patient seen with Lorel Monaco, PharmD, BCPS, PGY2 Pharmacy Resident.

## 2020-02-24 NOTE — Patient Instructions (Signed)
It was nice to see you today!  Your goal blood sugar is 80-130 before eating and less than 180 after eating.  Medication Changes: Begin taking Trulicity 1.5 mg weekly starting next week  Continue metformin 1000 mg twice daily  Continue atorvastatin 80 mg daily  Monitor blood sugars at home and keep a log (glucometer or piece of paper) to bring with you to your next visit.  Keep up the good work with diet and exercise. Aim for a diet full of vegetables, fruit and lean meats (chicken, [place], fish). Try to limit salt intake by eating fresh or frozen vegetables (instead of canned), rinse canned vegetables prior to cooking and do not add any additional salt to meals.

## 2020-02-24 NOTE — Assessment & Plan Note (Signed)
Diabetes longstanding currently uncontrolled with A1C 11.1%. Unable to assess home sugars as patient does not have glucometer yet.  Medication adherence appears optimal. Patient is able to verbalize appropriate hypoglycemia management plan. -Increased Trulicity (generic name dulaglutide) from 0.75 mg to 1.5 mg weekly -Continued metformin 1000 mg BID

## 2020-02-24 NOTE — Progress Notes (Signed)
Reviewed: I agree with Dr. Koval's documentation and management. 

## 2020-03-27 ENCOUNTER — Ambulatory Visit: Payer: Medicare Other

## 2020-03-27 NOTE — Progress Notes (Deleted)
    SUBJECTIVE:   CHIEF COMPLAINT / HPI:   DM: trulicity 1.5? Metformin.   Hld: restarted on atorvastatin  PERTINENT  PMH / PSH: ***  OBJECTIVE:   There were no vitals taken for this visit.  ***  ASSESSMENT/PLAN:   No problem-specific Assessment & Plan notes found for this encounter.     Sandre Kitty, MD [facility]

## 2020-03-30 ENCOUNTER — Other Ambulatory Visit: Payer: Self-pay

## 2020-03-30 ENCOUNTER — Ambulatory Visit (INDEPENDENT_AMBULATORY_CARE_PROVIDER_SITE_OTHER): Payer: Medicare Other | Admitting: Family Medicine

## 2020-03-30 VITALS — BP 125/70 | HR 100 | Ht 61.42 in | Wt 128.2 lb

## 2020-03-30 DIAGNOSIS — Z23 Encounter for immunization: Secondary | ICD-10-CM

## 2020-03-30 DIAGNOSIS — E13319 Other specified diabetes mellitus with unspecified diabetic retinopathy without macular edema: Secondary | ICD-10-CM | POA: Diagnosis not present

## 2020-03-30 DIAGNOSIS — E119 Type 2 diabetes mellitus without complications: Secondary | ICD-10-CM

## 2020-03-30 MED ORDER — SITAGLIPTIN PHOSPHATE 100 MG PO TABS: 100.0000 mg | ORAL_TABLET | Freq: Every day | ORAL | 3 refills | Status: DC

## 2020-03-30 NOTE — Progress Notes (Signed)
    SUBJECTIVE:   CHIEF COMPLAINT / HPI: "Medication SE"  Sandra Ryan is a 65 yo female presenting with son to discuss the following:   Son present for the duration of the visit and provided Nepali translation per patient request.  Diabetes/Medication concern: She recently was started on Trucility in 01/2020 (with dose increase in 02/2020) and reports undesirable side effects.  She states since initiation of this medication she "will not feel good" for approximately 2-3 days following injection every week.  She reports loss of appetite, nausea, feeling fatigued, and not wanting to drink anything.  By the time she feels better, she is due for another shot in another few days.  She experienced this with the lowest dose and now has become worse with the increase, had no improvement after taking the medication for several weeks.  She prefers an oral medication instead.  She has been able to check her sugar about 2-3 times weekly "only when it feels high", so usually around 200's.  She continues to take her metformin twice daily without concern.  She has previously tried [place], [place], [place].  She discontinued these due to side effects with the exception of Januvia, felt like she tolerated this well however due to a pharmacy formulary change she had to switch off of it several months ago.   PERTINENT  PMH / PSH: Hypertension, T2DM, diabetic retinopathy  OBJECTIVE:   BP 125/70   Pulse 100   Ht 5' 1.42" (1.56 m)   Wt 128 lb 3.2 oz (58.2 kg)   BMI 23.90 kg/m   General: Alert, NAD, son sitting next to her  HEENT: NCAT, MMM  Cardiac: Tachycardic with regular rhythm Lungs: Clear bilaterally, no increased WOB on RA Msk: Normal gait  Ext: Warm, dry, 2+ distal pulses, no edema b/l    ASSESSMENT/PLAN:   Diabetes ([facility]) Poorly controlled, A1c 11.1% in 01/2020. She has not been tolerating Trulicity after a 85-month trial, will discontinue today.  Unfortunately, she has not been able to  tolerate several alternative agents and would prefer an oral if at all possible.  Called to her pharmacy during appointment to confirm if they have Januvia once again, which they endorse they did.  Will restart on Januvia 100 mg daily.  Continue metformin as is.  Will likely need to consider adding an additional medication in the future for glycemic control.  Diabetic retinopathy, mild, bilateral Has not been able to schedule a follow-up appointment.  Last seen by Dr. Dione Booze in 2019.  Placed referral to ophthalmology and provided with Dr. Laruth Bouchard contact information to schedule an appointment.  COVID-19 vaccine administered Administered booster dose of Pfizer vaccine and monitored for 15 minutes without concern.  Counseled on common side effects including but not limited to arm soreness/malaise/headaches/fever/lymphadenopathy.     Follow-up in approximately 2-3 weeks with Dr. Allena Katz or sooner if needed.  Will need diabetic foot exam on follow-up.  Allayne Stack, DO [facility]

## 2020-03-30 NOTE — Patient Instructions (Addendum)
We are going to try to start Januvia again for her diabetes. STOP trucility. Drink plenty of fluids. Follow up in about 2 weeks with Dr. Allena Katz or sooner if needed.    [facility]   Address: [address] [place], [place], [place] [address] Phone: [phone]

## 2020-03-31 ENCOUNTER — Encounter: Payer: Self-pay | Admitting: Family Medicine

## 2020-03-31 DIAGNOSIS — Z23 Encounter for immunization: Secondary | ICD-10-CM | POA: Insufficient documentation

## 2020-03-31 NOTE — Assessment & Plan Note (Signed)
Poorly controlled, A1c 11.1% in 01/2020. She has not been tolerating Trulicity after a 3-month trial, will discontinue today.  Unfortunately, she has not been able to tolerate several alternative agents and would prefer an oral if at all possible.  Called to her pharmacy during appointment to confirm if they have Januvia once again, which they endorse they did.  Will restart on Januvia 100 mg daily.  Continue metformin as is.  Will likely need to consider adding an additional medication in the future for glycemic control.

## 2020-03-31 NOTE — Assessment & Plan Note (Signed)
Has not been able to schedule a follow-up appointment.  Last seen by Dr. Katy Fitch in 2019.  Placed referral to ophthalmology and provided with Dr. Zenia Resides contact information to schedule an appointment.

## 2020-03-31 NOTE — Assessment & Plan Note (Signed)
Administered booster dose of Pfizer vaccine and monitored for 15 minutes without concern.  Counseled on common side effects including but not limited to arm soreness/malaise/headaches/fever/lymphadenopathy.

## 2020-04-06 ENCOUNTER — Encounter: Payer: Self-pay | Admitting: *Deleted

## 2020-07-03 ENCOUNTER — Other Ambulatory Visit: Payer: Self-pay | Admitting: Pharmacist

## 2020-07-03 DIAGNOSIS — K219 Gastro-esophageal reflux disease without esophagitis: Secondary | ICD-10-CM

## 2020-09-01 ENCOUNTER — Other Ambulatory Visit: Payer: Self-pay

## 2020-09-01 ENCOUNTER — Encounter: Payer: Self-pay | Admitting: Family Medicine

## 2020-09-01 ENCOUNTER — Ambulatory Visit (INDEPENDENT_AMBULATORY_CARE_PROVIDER_SITE_OTHER): Payer: Medicare Other | Admitting: Family Medicine

## 2020-09-01 VITALS — BP 118/74 | HR 97 | Ht 61.0 in | Wt 128.2 lb

## 2020-09-01 DIAGNOSIS — R300 Dysuria: Secondary | ICD-10-CM | POA: Diagnosis not present

## 2020-09-01 DIAGNOSIS — E119 Type 2 diabetes mellitus without complications: Secondary | ICD-10-CM | POA: Diagnosis present

## 2020-09-01 DIAGNOSIS — R399 Unspecified symptoms and signs involving the genitourinary system: Secondary | ICD-10-CM

## 2020-09-01 LAB — POCT URINALYSIS DIP (MANUAL ENTRY)
Bilirubin, UA: NEGATIVE
Blood, UA: NEGATIVE
Glucose, UA: >=1000 mg/dL — AB
Ketones, POC UA: NEGATIVE mg/dL
Leukocytes, UA: NEGATIVE
Nitrite, UA: NEGATIVE
Protein Ur, POC: NEGATIVE mg/dL
Spec Grav, UA: <=1.005 — AB (ref 1.010–1.025)
Urobilinogen, UA: 0.2 E.U./dL
pH, UA: 5.5 (ref 5.0–8.0)

## 2020-09-01 LAB — POCT UA - MICROSCOPIC ONLY

## 2020-09-01 LAB — POCT GLYCOSYLATED HEMOGLOBIN (HGB A1C): HbA1c, POC (controlled diabetic range): 11.9 % — AB (ref 0.0–7.0)

## 2020-09-01 MED ORDER — INSULIN GLARGINE 100 UNIT/ML SOLOSTAR PEN: 5.0000 [IU] | PEN_INJECTOR | SUBCUTANEOUS | 11 refills | Status: DC

## 2020-09-01 NOTE — Progress Notes (Signed)
     SUBJECTIVE:   CHIEF COMPLAINT / HPI:   Sandra Ryan is a 65 y.o. female presents for diabetes follow up  Diabetes  Diabetes Patient's current diabetic medications include Januvia and Metformin. Tolerating well without side effects. She has stopped the Trulicity and Jardiance which was causing side effects such as dry mouth.  Patient endorses compliance with these medications.  Patient's last A1c was  Lab Results  Component Value Date   HGBA1C 11.9 (A) 09/01/2020   HGBA1C 11.1 (A) 02/01/2020   HGBA1C 10.8 (A) 10/05/2019   Current A1c is 11.9.  Denies abdominal pain, blurred vision, polyuria, polydipsia, hypoglycemia. Patient states they understand that diet and exercise can help with her diabetes.  Last Microalbumin, LDL, Creatinine: Lab Results  Component Value Date   MICROALBUR 10 01/31/2012   LDLCALC 110 (H) 07/07/2019   CREATININE 0.76 02/01/2020     Urinary sx Pt reports dysuria for 2 months. The urine is "very clear". Also reports lower abdominal pains sometimes. Denies frequency, urgency or hematuria.    [facility] Office Visit from 09/01/2020 in [place]  PHQ-9 Total Score 64       PERTINENT  PMH / PSH: DM, HLD   OBJECTIVE:   BP 118/74   Pulse 97   Ht 5\' 1"  (1.549 m)   Wt 128 lb 3.2 oz (58.2 kg)   SpO2 98%   BMI 24.22 kg/m    General: Alert, no acute distress, pleasant  Cardio: Normal S1 and S2, RRR, no r/m/g Pulm: CTAB, normal work of breathing Abdomen: Bowel sounds normal. Abdomen soft and non-tender.  Extremities: No peripheral edema.  Neuro: Cranial nerves grossly intact   Foot exam: No deformities, ulcerations, or other skin breakdown on feet bilaterally.  Sensation intact to monofilament and light touch.  PT and DP pulses intact BL.    ASSESSMENT/PLAN:   Diabetes ([place]) Poorly controlled A1c 11.9. Worsened from previous which was 11.1. She does endorse compliance with Metformin and Januvia. Pt has poor  understanding of her overall condition and need for better glycemic control. She has been unable to tolerate multiple diabetic medicaitons: trulicity, jardiance in the past. Pt is amenable to starting Lantus. Started on lantus 5 units. Follow up with [facility] on Monday 13th June.   Lower urinary tract symptoms UA negative for UTI. >1000 glucose. See diabetes management.     Sandra Haw, MD PGY-2 [place]

## 2020-09-01 NOTE — Assessment & Plan Note (Addendum)
Poorly controlled A1c 11.9. Worsened from previous which was 11.1. She does endorse compliance with Metformin and Januvia. Pt has poor understanding of her overall condition and need for better glycemic control. She has been unable to tolerate multiple diabetic medicaitons: trulicity, jardiance in the past. Pt is amenable to starting Lantus. Started on lantus 5 units. Follow up with [facility] on Monday 13th June.

## 2020-09-01 NOTE — Patient Instructions (Signed)
?? ???? ????? ?????????? ???????? ?? ?? ????? ????? ?? ?????? ?????? ????? ?????? ?????? ???? ???? ????? ???? ??????? ???? ???? ??? ? ??????? ??????? ???? ?????? ???? ??? ?? ? ?????? ???????? ? ????? ???? ??? ???? ???? ??????? ??????  ????? ?????? ??????????? ??????? ???-?? ??????????  ??? ???????? ???? ?????? ?? ????????? ??? ???, ????? [phone]  [phone] ?? ??????????? ?? ???? ????????????????  ????????,  ?? ???? 

## 2020-09-02 LAB — MICROALBUMIN / CREATININE URINE RATIO
Creatinine, Urine: 22.1 mg/dL
Microalb/Creat Ratio: <14 mg/g creat (ref 0–29)
Microalbumin, Urine: <3 ug/mL

## 2020-09-03 ENCOUNTER — Other Ambulatory Visit: Payer: Self-pay | Admitting: Family Medicine

## 2020-09-03 DIAGNOSIS — R399 Unspecified symptoms and signs involving the genitourinary system: Secondary | ICD-10-CM | POA: Insufficient documentation

## 2020-09-03 DIAGNOSIS — E119 Type 2 diabetes mellitus without complications: Secondary | ICD-10-CM

## 2020-09-03 NOTE — Assessment & Plan Note (Signed)
UA negative for UTI. >1000 glucose. See diabetes management.

## 2020-09-04 ENCOUNTER — Ambulatory Visit (INDEPENDENT_AMBULATORY_CARE_PROVIDER_SITE_OTHER): Payer: Medicare Other | Admitting: Pharmacist

## 2020-09-04 ENCOUNTER — Other Ambulatory Visit: Payer: Self-pay

## 2020-09-04 DIAGNOSIS — E118 Type 2 diabetes mellitus with unspecified complications: Secondary | ICD-10-CM

## 2020-09-04 DIAGNOSIS — E119 Type 2 diabetes mellitus without complications: Secondary | ICD-10-CM | POA: Diagnosis not present

## 2020-09-04 DIAGNOSIS — E785 Hyperlipidemia, unspecified: Secondary | ICD-10-CM

## 2020-09-04 DIAGNOSIS — E1169 Type 2 diabetes mellitus with other specified complication: Secondary | ICD-10-CM | POA: Diagnosis not present

## 2020-09-04 MED ORDER — SITAGLIPTIN PHOSPHATE 100 MG PO TABS: 100.0000 mg | ORAL_TABLET | Freq: Every day | ORAL | 1 refills | Status: DC

## 2020-09-04 MED ORDER — ACCU-CHEK SOFTCLIX LANCETS MISC: 11 refills | Status: DC

## 2020-09-04 MED ORDER — ACCU-CHEK GUIDE VI STRP: ORAL_STRIP | 12 refills | Status: DC

## 2020-09-04 MED ORDER — ATORVASTATIN CALCIUM 80 MG PO TABS: 80.0000 mg | ORAL_TABLET | Freq: Every day | ORAL | 3 refills | Status: DC

## 2020-09-04 NOTE — Progress Notes (Signed)
Subjective:    Patient ID: Sandra Ryan, female    DOB: Jul 27, 1955, 65 y.o.   MRN: [id number]  HPI Patient is a 65 y.o. female who presents for diabetes management and Lantus demonstration. She is in good spirits and presents without assistance. Patient's son presented with her to appt and translated information to patient. Patient was referred and last seen by Primary Care Provider on 09/01/20.  Insurance coverage/medication affordability: Medicare  Current diabetes medications include: insulin glargine (Lantus) 5 units once daily, metformin 1000mg  BID, sitagliptin (Januvia) 100mg  daily Current hyperlipidemia medications include: atorvastatin 80mg  Patient's son states that She is taking her diabetes medications as prescribed. Patient's son denies adherence with medications. Patient's son states that she misses her medications a couple times a week. He reports she has not begun taking her Lantus yet as he will be the one administering it to her and he was not present at appt on 09/01/20 for education.   Patient reports hypoglycemic events. Patient complains of occasional dizziness and not feeling well but she has never checked her blood glucose during these episodes to confirm hypoglycemia.  Patient denies polyuria (increased urination).  Patient denies polyphagia (increased appetite).  Patient denies polydipsia (increased thirst).  Patient reports neuropathy (nerve pain). Patient denies visual changes. Patient denies self foot exams.   Patient is not checking her blood glucose readings due to needing test strips and lancets.  Objective:   Labs:    Lab Results  Component Value Date   HGBA1C 11.9 (A) 09/01/2020   HGBA1C 11.1 (A) 02/01/2020   HGBA1C 10.8 (A) 10/05/2019    Lab Results  Component Value Date   MICRALBCREAT <14 09/01/2020    Lipid Panel     Component Value Date/Time   CHOL 205 (H) 07/07/2019 1532   TRIG 330 (H) 07/07/2019 1532   HDL 38 (L) 07/07/2019 1532    CHOLHDL 5.4 (H) 07/07/2019 1532   CHOLHDL 4.7 05/04/2015 1057   VLDL 47 (H) 05/04/2015 1057   LDLCALC 110 (H) 07/07/2019 1532   LDLDIRECT 139 (H) 01/31/2012 1634    Clinical Atherosclerotic Cardiovascular Disease (ASCVD): No  The 10-year ASCVD risk score Mikey Bussing DC Jr., et al., 2013) is: 11.6%   Values used to calculate the score:     Age: 44 years     Sex: Female     Is Non-Hispanic African American: No     Diabetic: Yes     Tobacco smoker: No     Systolic Blood Pressure: 347 mmHg     Is BP treated: No     HDL Cholesterol: 38 mg/dL     Total Cholesterol: 205 mg/dL    Assessment/Plan:   T2DM is not controlled likely due to suboptimal adherence and issues tolerating Trulicity and Jardiance. Patient forgot to bring Lantus pen but used demo to show her son how to inject insulin. Patient and son educated on purpose, proper use and potential adverse effects of Lantus. Following instruction patient and son verbalized understanding of treatment plan.    Started basal insulin glargine (Lantus) 5 units once daily.  Counseled on s/sx of and management of hypoglycemia Next A1C anticipated September 2022.  ASCVD risk - primary prevention in patient with diabetes. Last LDL is not controlled. ASCVD risk score is not >20%  - moderate intensity statin indicated. Patient previously prescribed atorvastatin 80mg  but not currently taking atorvastatin 80mg .  Restarted atorvastatin 80 mg.   Follow-up appointment 2 weeks to review sugar readings. Written patient instructions provided.  This appointment required 60 minutes of patient care (this includes precharting, chart review, review of results, and face-to-face care).  Thank you for involving pharmacy to assist in providing this patient's care.  Patient seen with Caren Macadam PharmD Candidate, and Romilda Garret, PharmD - PGY-1 Resident.

## 2020-09-04 NOTE — Patient Instructions (Signed)
Ms. Mattox it was a pleasure seeing you today.   Please do the following:  Take 5 units of Lantus insulin once a day as directed today during your appointment. If you have any questions or if you believe something has occurred because of this change, call me or your doctor to let one of [place] know.  Continue checking blood sugars at home. It's really important that you record these and bring these in to your next doctor's appointment.  Continue making the lifestyle changes we've discussed together during our visit. Diet and exercise play a significant role in improving your blood sugars.  Follow-up with me on June 28th at 9:30 AM   Hypoglycemia or low blood sugar:   Low blood sugar can happen quickly and may become an emergency if not treated right away.   While this shouldn't happen often, it can be brought upon if you skip a meal or do not eat enough. Also, if your insulin or other diabetes medications are dosed too high, this can cause your blood sugar to go to low.   Warning signs of low blood sugar include: Feeling shaky or dizzy Feeling weak or tired  Excessive hunger Feeling anxious or upset  Sweating even when you aren't exercising  What to do if I experience low blood sugar? Follow the Rule of 15 Check your blood sugar with your meter. If lower than 70, proceed to step 2.  Treat with 15 grams of fast acting carbs which is found in 3-4 glucose tablets. If none are available you can try hard candy, 1 tablespoon of sugar or honey,4 ounces of fruit juice, or 6 ounces of REGULAR soda.  Re-check your sugar in 15 minutes. If it is still below 70, do what you did in step 2 again. If your blood sugar has come back up, go ahead and eat a snack or small meal made up of complex carbs (ex. Whole grains) and protein at this time to avoid recurrence of low blood sugar.

## 2020-09-05 LAB — URINE CULTURE

## 2020-09-11 ENCOUNTER — Other Ambulatory Visit: Payer: Self-pay

## 2020-09-11 DIAGNOSIS — E118 Type 2 diabetes mellitus with unspecified complications: Secondary | ICD-10-CM

## 2020-09-11 MED ORDER — INSULIN PEN NEEDLE 32G X 4 MM MISC: 0 refills | Status: DC

## 2020-09-11 MED ORDER — ACCU-CHEK GUIDE VI STRP: ORAL_STRIP | 12 refills | Status: DC

## 2020-09-11 NOTE — Telephone Encounter (Signed)
Patient's daughter calls nurse line regarding glucometer test strips and pen needles. Daughter is requesting rx for glucometer strips to be transferred from [facility] to [place]. Called and canceled rx at [facility]. Daughter is also requesting that pen needles be sent in for patient to inject insulin.   Sent per protocol.   Talbot Grumbling, RN

## 2020-09-19 ENCOUNTER — Other Ambulatory Visit: Payer: Self-pay

## 2020-09-19 ENCOUNTER — Ambulatory Visit: Payer: Medicare Other | Admitting: Pharmacist

## 2020-09-19 NOTE — Progress Notes (Deleted)
T2DM is not controlled likely due to suboptimal adherence and issues tolerating Trulicity and Jardiance. Patient forgot to bring Lantus pen but used demo to show her son how to inject insulin. Patient and son educated on purpose, proper use and potential adverse effects of Lantus. Following instruction patient and son verbalized understanding of treatment plan.    Started basal insulin glargine (Lantus) 5 units once daily.  Counseled on s/sx of and management of hypoglycemia Next A1C anticipated September 2022.  ASCVD risk - primary prevention in patient with diabetes. Last LDL is not controlled. ASCVD risk score is not >20%  - moderate intensity statin indicated. Patient previously prescribed atorvastatin 80mg  but not currently taking atorvastatin 80mg .  Restarted atorvastatin 80 mg.   Follow-up appointment 2 weeks to review sugar readings. Written patient instructions provided.  This appointment required 60 minutes of patient care (this includes precharting, chart review, review of results, and face-to-face care).  Thank you for involving pharmacy to assist in providing this patient's care.  Patient seen with Caren Macadam PharmD Candidate, and Romilda Garret, PharmD - PGY-1 Resident.      Subjective:    Patient ID: Sandra Ryan, female    DOB: [birth year], 65 y.o.   MRN: [id number]  HPI Patient is a 65 y.o. female who presents for diabetes management and Lantus demonstration. She is in good spirits and presents without assistance. Patient's son presented with her to appt and translated information to patient. Patient was referred and last seen by Primary Care Provider on 09/01/20.  Insurance coverage/medication affordability: Medicare  Current diabetes medications include: insulin glargine (Lantus) 5 units once daily, metformin 1000mg  BID, sitagliptin (Januvia) 100mg  daily Current hyperlipidemia medications include: atorvastatin 80mg  Still non-adherent? Patient states that {He/she  (caps):30048} {Is/is not:9024} taking {his/her/their:21314} diabetes medications as prescribed. Patient {Actions; denies-reports:120008} adherence with medications. Patient states that {He/she (caps):30048} misses {his/her/their:21314} medications *** times per week, on average.  Does you feel that your medications are working for you?  {YES NO:22349}  Have you been experiencing any side effects to the medications prescribed? {YES NO:22349}  Do you have any problems obtaining medications due to transportation or finances?  {YES [id number]     Patient reported dietary habits:  Eats *** meals/day and *** snacks/day; Boluses with *** meals/day and *** snacks/day Breakfast:*** Lunch:*** Dinner:*** Snacks:*** Drinks:***  Patient-reported exercise habits: ***   Patient {Actions; denies-reports:120008} hypoglycemic events. Patient {Actions; denies-reports:120008} polyuria (increased urination).  Patient {Actions; denies-reports:120008} polyphagia (increased appetite).  Patient {Actions; denies-reports:120008} polydipsia (increased thirst).  Patient {Actions; denies-reports:120008} neuropathy (nerve pain). Patient {Actions; denies-reports:120008} visual changes. Patient {Actions; denies-reports:120008} self foot exams.   Home fasting blood sugars: ***  2 hour post-meal/random blood sugars: ***  Objective:   Labs:   Physical Exam  ROS  Lab Results  Component Value Date   HGBA1C 11.9 (A) 09/01/2020   HGBA1C 11.1 (A) 02/01/2020   HGBA1C 10.8 (A) 10/05/2019    There were no vitals filed for this visit.  Lab Results  Component Value Date   MICRALBCREAT <14 09/01/2020    Lipid Panel     Component Value Date/Time   CHOL 205 (H) 07/07/2019 1532   TRIG 330 (H) 07/07/2019 1532   HDL 38 (L) 07/07/2019 1532   CHOLHDL 5.4 (H) 07/07/2019 1532   CHOLHDL 4.7 05/04/2015 1057   VLDL 47 (H) 05/04/2015 1057   LDLCALC 110 (H) 07/07/2019 1532   LDLDIRECT 139 (H) 01/31/2012 1634     Clinical Atherosclerotic Cardiovascular Disease (ASCVD): {YES/NO:21197} The 10-year  ASCVD risk score Mikey Bussing DC Brooke Bonito., et al., 2013) is: 11.6%   Values used to calculate the score:     Age: 21 years     Sex: Female     Is Non-Hispanic African American: No     Diabetic: Yes     Tobacco smoker: No     Systolic Blood Pressure: 696 mmHg     Is BP treated: No     HDL Cholesterol: 38 mg/dL     Total Cholesterol: 205 mg/dL   PHQ-9 Score: ***  Assessment/Plan:   ***T1/T2DM {ACTION; IS/IS EXB:28413244} controlled likely due to ***. Medication adherence appears ***. Additional pharmacotherapy {ACTION; IS/IS WNU:27253664}. Patient with a history of intolerance to ***. Will initiate ***. Benefits of medication include ***. Patient educated on purpose, proper use and potential adverse effects of ***.  Following instruction patient verbalized understanding of treatment plan.    {Meds adjust:18428} basal insulin *** (insulin ***). Patient will continue to titrate 1 unit every *** days if fasting blood sugar > 100mg /dl until fasting blood sugars reach goal or next visit. {Meds adjust:18428}  rapid insulin *** (insulin ***) to ***.  {Meds adjust:18428} GLP-1 *** (generic name***) to ***.  {Meds adjust:18428} SGLT2-I *** (generic name***) to ***. Counseled on sick day rules for ***. Extensively discussed pathophysiology of diabetes, dietary effects on blood sugar control, and recommended lifestyle interventions,  Patient will adhere to dietary modifications Patient will exercise *** with goal to increase towards target of at least 150 minutes of moderate intensity exercise weekly Counseled on s/sx of and management of hypoglycemia Next A1C anticipated ***.   ASCVD risk - primary***secondary prevention in patient with diabetes. Last LDL {Is/is not:9024} controlled. ASCVD risk score {Is/is not:9024} >20%  - {Desc; low/moderate/high:110033} intensity statin indicated. Aspirin {Is/is not:9024} indicated.    {Meds adjust:18428} aspirin *** mg  {Meds adjust:18428} ***statin *** mg.   Hypertension longstanding*** currently ***.  Blood pressure goal = *** mmHg. Medication adherence ***.  Blood pressure control is suboptimal due to ***.  *** Extensively discussed pathophysiology of blood pressure, dietary effects on blood pressure control, and recommended lifestyle interventions  Follow-up appointment *** to review sugar readings. Written patient instructions provided.  This appointment required *** minutes of patient care (this includes precharting, chart review, review of results, and face-to-face care).  Thank you for involving pharmacy to assist in providing this patient's care.  Patient seen with ***

## 2020-09-26 ENCOUNTER — Other Ambulatory Visit: Payer: Self-pay

## 2020-09-26 ENCOUNTER — Ambulatory Visit (INDEPENDENT_AMBULATORY_CARE_PROVIDER_SITE_OTHER): Payer: Medicare Other | Admitting: Pharmacist

## 2020-09-26 DIAGNOSIS — E119 Type 2 diabetes mellitus without complications: Secondary | ICD-10-CM | POA: Diagnosis not present

## 2020-09-26 DIAGNOSIS — E118 Type 2 diabetes mellitus with unspecified complications: Secondary | ICD-10-CM

## 2020-09-26 DIAGNOSIS — E7800 Pure hypercholesterolemia, unspecified: Secondary | ICD-10-CM

## 2020-09-26 MED ORDER — ACCU-CHEK GUIDE W/DEVICE KIT: 1.0000 | PACK | Freq: Every day | 0 refills | Status: DC

## 2020-09-26 MED ORDER — ACCU-CHEK GUIDE VI STRP: ORAL_STRIP | 12 refills | Status: DC

## 2020-09-26 MED ORDER — ACCU-CHEK SOFTCLIX LANCETS MISC: 11 refills | Status: DC

## 2020-09-26 NOTE — Assessment & Plan Note (Signed)
T2DM is not controlled likely due to suboptimal adherence and issues with tolerability. Patient was prescribed very low dose of Lantus and this is likely not the cause of the patient not feeling well, it is likely that her blood glucose has continued to be in the 300 range. Patient willing to re-try Lantus at lower dose of 3 units due to continued hyperglycemia. This will need to be increased to obtain proper glycemic control but will start with this. Following instruction patient and son verbalized understanding of treatment plan.    1. Decreased dose of basal insulin glargine (Lantus) to 3 units once daily.  2. Counseled on s/sx of and management of hypoglycemia 3. Sent in new prescription for glucometer and testing supplies 4. Next A1C anticipated September 2022.

## 2020-09-26 NOTE — Assessment & Plan Note (Signed)
ASCVD risk - primary prevention in patient with diabetes. Last LDL is not controlled. Patient recently re-started atorvastatin 80mg  but did not present with medication in bag to visit therefore unable to assess adherence.  1. Continued atorvastatin 80 mg.  2. Obtain updated lipid panel at follow-up visit.

## 2020-09-26 NOTE — Patient Instructions (Signed)
Sandra Ryan it was a pleasure seeing you today.   Please do the following:  Take 3 units of Lantus as directed today during your appointment. If you have any questions or if you believe something has occurred because of this change, call me or your doctor to let one of [place] know.  Continue checking blood sugars at home. It's really important that you record these and bring these in to your next doctor's appointment.  Continue making the lifestyle changes we've discussed together during our visit. Diet and exercise play a significant role in improving your blood sugars.  Follow-up with me in one week.   Hypoglycemia or low blood sugar:   Low blood sugar can happen quickly and may become an emergency if not treated right away.   While this shouldn't happen often, it can be brought upon if you skip a meal or do not eat enough. Also, if your insulin or other diabetes medications are dosed too high, this can cause your blood sugar to go to low.   Warning signs of low blood sugar include: Feeling shaky or dizzy Feeling weak or tired  Excessive hunger Feeling anxious or upset  Sweating even when you aren't exercising  What to do if I experience low blood sugar? Follow the Rule of 15 Check your blood sugar with your meter. If lower than 70, proceed to step 2.  Treat with 15 grams of fast acting carbs which is found in 3-4 glucose tablets. If none are available you can try hard candy, 1 tablespoon of sugar or honey,4 ounces of fruit juice, or 6 ounces of REGULAR soda.  Re-check your sugar in 15 minutes. If it is still below 70, do what you did in step 2 again. If your blood sugar has come back up, go ahead and eat a snack or small meal made up of complex carbs (ex. Whole grains) and protein at this time to avoid recurrence of low blood sugar.

## 2020-09-26 NOTE — Progress Notes (Signed)
Subjective:    Patient ID: Sandra Ryan, female    DOB: Aug 16, 1955, 65 y.o.   MRN: [id number]  HPI Patient is a 65 y.o. female who presents for diabetes management and assessment of Lantus administration. She is in good spirits and presents without assistance. Patient's son presented with her to appt and translated information to patient. Patient was referred and last seen by Primary Care Provider on 09/01/20.  Patient's son reports that insulin gave her a headache and that she was experiencing blurry vision. He states he administered the Lantus to her for around 3 days and stopped a couple days ago due to her not tolerating the medication.   Insurance coverage/medication affordability: Medicare  Current diabetes medications include: insulin glargine (Lantus) 5 units once daily, metformin 1000mg  BID, sitagliptin (Januvia) 100mg  daily Current hyperlipidemia medications include: atorvastatin 80mg  Patient's son states that She is taking her diabetes medications as prescribed. Patient's son denies adherence with medications. Patient's son states that she misses her medications a couple times a week.    Patient denies hypoglycemic events.  Patient denies polyuria (increased urination).  Patient denies polyphagia (increased appetite).  Patient denies polydipsia (increased thirst).  Patient reports neuropathy (nerve pain). Patient reports visual changes. Patient denies self foot exams.   Patient's son reports they were not able to pick up her glucometer and supplies so she has been unable to check her blood glucose frequently. He reports she has been using his sister's glucometer to check it from time to time but did not present with device. From recall he reports her blood glucose readings have been in the 300's.  Objective:   Labs:    Lab Results  Component Value Date   HGBA1C 11.9 (A) 09/01/2020   HGBA1C 11.1 (A) 02/01/2020   HGBA1C 10.8 (A) 10/05/2019    Lab Results  Component  Value Date   MICRALBCREAT <14 09/01/2020    Lipid Panel     Component Value Date/Time   CHOL 205 (H) 07/07/2019 1532   TRIG 330 (H) 07/07/2019 1532   HDL 38 (L) 07/07/2019 1532   CHOLHDL 5.4 (H) 07/07/2019 1532   CHOLHDL 4.7 05/04/2015 1057   VLDL 47 (H) 05/04/2015 1057   LDLCALC 110 (H) 07/07/2019 1532   LDLDIRECT 139 (H) 01/31/2012 1634    Clinical Atherosclerotic Cardiovascular Disease (ASCVD): No  The 10-year ASCVD risk score Mikey Bussing DC Jr., et al., 2013) is: 11.6%   Values used to calculate the score:     Age: 44 years     Sex: Female     Is Non-Hispanic African American: No     Diabetic: Yes     Tobacco smoker: No     Systolic Blood Pressure: 202 mmHg     Is BP treated: No     HDL Cholesterol: 38 mg/dL     Total Cholesterol: 205 mg/dL    Assessment/Plan:   T2DM is not controlled likely due to suboptimal adherence and issues with tolerability. Patient was prescribed very low dose of Lantus and this is likely not the cause of the patient not feeling well, it is likely that her blood glucose has continued to be in the 300 range. Patient willing to re-try Lantus at lower dose of 3 units due to continued hyperglycemia. This will need to be increased to obtain proper glycemic control but will start with this. Following instruction patient and son verbalized understanding of treatment plan.    Decreased dose of basal insulin glargine (Lantus) to 3 units  once daily.  Counseled on s/sx of and management of hypoglycemia Sent in new prescription for glucometer and testing supplies Next A1C anticipated September 2022.  ASCVD risk - primary prevention in patient with diabetes. Last LDL is not controlled. Patient recently re-started atorvastatin 80mg  but did not present with medication in bag to visit therefore unable to assess adherence.  Continued atorvastatin 80 mg.  Obtain updated lipid panel at follow-up visit.  Follow-up appointment 1 week to review sugar readings. Written  patient instructions provided.  This appointment required 30 minutes of patient care (this includes precharting, chart review, review of results, and face-to-face care).  Thank you for involving pharmacy to assist in providing this patient's care.

## 2020-09-28 ENCOUNTER — Telehealth: Payer: Self-pay

## 2020-09-28 ENCOUNTER — Other Ambulatory Visit: Payer: Self-pay | Admitting: Family Medicine

## 2020-09-28 DIAGNOSIS — K219 Gastro-esophageal reflux disease without esophagitis: Secondary | ICD-10-CM

## 2020-09-28 MED ORDER — OMEPRAZOLE 20 MG PO CPDR: 20.0000 mg | DELAYED_RELEASE_CAPSULE | Freq: Every day | ORAL | 3 refills | Status: DC

## 2020-09-28 NOTE — Telephone Encounter (Signed)
-----   Message from [name], MD sent at 09/28/2020 11:38 AM EDT ----- Regarding: omeprazole refill Hi team,  Please could you let this pt know I sent in her refill for omeprazole. Her son speaks [place] FYI so you can speak to him.  Thank you :)  [place]

## 2020-09-28 NOTE — Telephone Encounter (Signed)
Informed son that the omeprazole Rx has been sent to [facility]. Ottis Stain, CMA

## 2020-10-03 ENCOUNTER — Ambulatory Visit (INDEPENDENT_AMBULATORY_CARE_PROVIDER_SITE_OTHER): Payer: Medicare Other | Admitting: Pharmacist

## 2020-10-03 ENCOUNTER — Other Ambulatory Visit: Payer: Self-pay

## 2020-10-03 DIAGNOSIS — E118 Type 2 diabetes mellitus with unspecified complications: Secondary | ICD-10-CM

## 2020-10-03 MED ORDER — INSULIN DEGLUDEC 100 UNIT/ML ~~LOC~~ SOPN: 4.0000 [IU] | PEN_INJECTOR | Freq: Every day | SUBCUTANEOUS | 0 refills | Status: DC

## 2020-10-03 NOTE — Progress Notes (Signed)
Subjective:    Patient ID: Sandra Ryan, female    DOB: [birth year], 65 y.o.   MRN: [id number]  HPI Patient is a 65 y.o. female who presents for diabetes management. She is in good spirits and presents without assistance. Patient's son presented with her to appt and translated information to patient. Patient was referred and last seen by Primary Care Provider on 09/01/20.  Patient's son reports that he has been giving his mother 3 units of Lantus daily but that she still does not feel well and is requesting alternatives. He states that she mentions insulin and the Trulicity injection all have given her this blurry vision and this unwell feeling in the past. She would prefer to no longer continue on injectable medications.  Insurance coverage/medication affordability: Medicare  Current diabetes medications include: insulin glargine (Lantus) 3 units once daily, metformin 1000mg  BID, sitagliptin (Januvia) 100mg  daily Current hyperlipidemia medications include: atorvastatin 80mg  Patient's son states that She is taking her diabetes medications as prescribed. Patient's son denies adherence with medications. Patient's son states that she misses her medications a couple times a week.    Patient denies hypoglycemic events.  Patient denies polyuria (increased urination).  Patient denies polyphagia (increased appetite).  Patient denies polydipsia (increased thirst).  Patient reports neuropathy (nerve pain). Patient reports visual changes. Patient denies self foot exams.   Patient's son able to pick up glucometer two days ago. Fasting blood glucose: 208 Post-prandial blood glucose: 277  Objective:   Labs:    Lab Results  Component Value Date   HGBA1C 11.9 (A) 09/01/2020   HGBA1C 11.1 (A) 02/01/2020   HGBA1C 10.8 (A) 10/05/2019    Lab Results  Component Value Date   MICRALBCREAT <14 09/01/2020    Lipid Panel     Component Value Date/Time   CHOL 205 (H) 07/07/2019 1532   TRIG 330 (H)  07/07/2019 1532   HDL 38 (L) 07/07/2019 1532   CHOLHDL 5.4 (H) 07/07/2019 1532   CHOLHDL 4.7 05/04/2015 1057   VLDL 47 (H) 05/04/2015 1057   LDLCALC 110 (H) 07/07/2019 1532   LDLDIRECT 139 (H) 01/31/2012 1634    Clinical Atherosclerotic Cardiovascular Disease (ASCVD): No  The 10-year ASCVD risk score Mikey Bussing DC Jr., et al., 2013) is: 11.6%   Values used to calculate the score:     Age: 36 years     Sex: Female     Is Non-Hispanic African American: No     Diabetic: Yes     Tobacco smoker: No     Systolic Blood Pressure: 948 mmHg     Is BP treated: No     HDL Cholesterol: 38 mg/dL     Total Cholesterol: 205 mg/dL    Assessment/Plan:   T2DM is not controlled likely due to suboptimal adherence and issues with tolerability. Extensively discussed with patient need for further therapy, including insulin, based on patient's home blood glucose readings and last A1C. Discussed with son and patient complications that can arise from uncontrolled T2DM. Patient agreeable to trial different insulin. Provided sample of Tresiba in place of Lantus. Instructed patient and son to inject 4 units once daily. Patient's son properly administered first dose in office. This will need to be increased to obtain proper glycemic control but will start with this. Following instruction patient and son verbalized understanding of treatment plan.    Inititated insulin degludec [name]) 4 units once daily and discontinued basal insulin glargine (Lantus). Counseled on s/sx of and management of hypoglycemia Sent in new  prescription for glucometer and testing supplies Sent in referral for patient to opthalmology per request of son as patient missed appt several months ago Next A1C anticipated September 2022.  ASCVD risk - primary prevention in patient with diabetes. Last LDL is not controlled. Patient recently re-started atorvastatin 80mg  but did not present with medication in bag to visit therefore unable to assess  adherence.  Continued atorvastatin 80 mg.  Unable to obtain lipid panel today; will obtain at next visit.  Follow-up appointment 2 weeks to review sugar readings. Written patient instructions provided.  This appointment required 30 minutes of direct patient care.  Thank you for involving pharmacy to assist in providing this patient's care.

## 2020-10-03 NOTE — Patient Instructions (Signed)
Ms. it was a pleasure seeing you today.   Please do the following:  Switch Lantus to [name]. Please do not take anymore Lantus. You will now take the new pen we gave you in clinic the [name]. Please take 4 units as directed today during your appointment. If you have any questions or if you believe something has occurred because of this change, call me or your doctor to let one of [place] know.  Continue checking blood sugars at home. It's really important that you record these and bring these in to your next doctor's appointment.  Continue making the lifestyle changes we've discussed together during our visit. Diet and exercise play a significant role in improving your blood sugars.  Follow-up with me in two weeks.  Hypoglycemia or low blood sugar:   Low blood sugar can happen quickly and may become an emergency if not treated right away.   While this shouldn't happen often, it can be brought upon if you skip a meal or do not eat enough. Also, if your insulin or other diabetes medications are dosed too high, this can cause your blood sugar to go to low.   Warning signs of low blood sugar include: Feeling shaky or dizzy Feeling weak or tired  Excessive hunger Feeling anxious or upset  Sweating even when you aren't exercising  What to do if I experience low blood sugar? Follow the Rule of 15 Check your blood sugar with your meter. If lower than 70, proceed to step 2.  Treat with 15 grams of fast acting carbs which is found in 3-4 glucose tablets. If none are available you can try hard candy, 1 tablespoon of sugar or honey,4 ounces of fruit juice, or 6 ounces of REGULAR soda.  Re-check your sugar in 15 minutes. If it is still below 70, do what you did in step 2 again. If your blood sugar has come back up, go ahead and eat a snack or small meal made up of complex carbs (ex. Whole grains) and protein at this time to avoid recurrence of low blood sugar.

## 2020-10-17 ENCOUNTER — Other Ambulatory Visit: Payer: Self-pay

## 2020-10-17 ENCOUNTER — Ambulatory Visit (INDEPENDENT_AMBULATORY_CARE_PROVIDER_SITE_OTHER): Payer: Medicare Other | Admitting: Pharmacist

## 2020-10-17 DIAGNOSIS — E119 Type 2 diabetes mellitus without complications: Secondary | ICD-10-CM

## 2020-10-17 MED ORDER — METFORMIN HCL 1000 MG PO TABS: 1000.0000 mg | ORAL_TABLET | Freq: Two times a day (BID) | ORAL | 0 refills | Status: DC

## 2020-10-17 MED ORDER — SITAGLIPTIN PHOSPHATE 100 MG PO TABS: 100.0000 mg | ORAL_TABLET | Freq: Every day | ORAL | 2 refills | Status: DC

## 2020-10-17 NOTE — Progress Notes (Signed)
Subjective:    Patient ID: Sandra Ryan, female    DOB: [birth year], 65 y.o.   MRN: [id number]  HPI Patient is a 65 y.o. female who presents for diabetes management. She is in good spirits and presents without assistance. Patient's son presented with her to appt and translated information to patient. Patient was referred and last seen by Primary Care Provider on 09/01/20. Last seen in pharmacy clinic on 10/03/20.  Patient's son reports that "things have been going good." He reports administering the [name] to his mother before he leaves for work each day with no issues. He does state he occasionally forgets before work but will administer it right when he gets home. Patient states she is currently not experiencing the blurry vision that she previously had when taking Lantus.   Insurance coverage/medication affordability: Medicare  Current diabetes medications include: insulin degludec [name]) 4 units once daily, metformin '1000mg'$  BID, sitagliptin (Januvia) '100mg'$  daily Current hyperlipidemia medications include: atorvastatin '80mg'$  Patient's son states that She is taking her diabetes medications as prescribed. Patient's son reports adherence with medications.    Patient denies hypoglycemic events.  Patient denies polyuria (increased urination).  Patient denies polyphagia (increased appetite).  Patient denies polydipsia (increased thirst).  Patient reports neuropathy (nerve pain). Patient denies visual changes. Patient denies self foot exams.   Fasting blood glucose: 231, 253, 334, 159, 177  Objective:   Labs:    Lab Results  Component Value Date   HGBA1C 11.9 (A) 09/01/2020   HGBA1C 11.1 (A) 02/01/2020   HGBA1C 10.8 (A) 10/05/2019    Lab Results  Component Value Date   MICRALBCREAT <14 09/01/2020    Lipid Panel     Component Value Date/Time   CHOL 205 (H) 07/07/2019 1532   TRIG 330 (H) 07/07/2019 1532   HDL 38 (L) 07/07/2019 1532   CHOLHDL 5.4 (H) 07/07/2019 1532    CHOLHDL 4.7 05/04/2015 1057   VLDL 47 (H) 05/04/2015 1057   LDLCALC 110 (H) 07/07/2019 1532   LDLDIRECT 139 (H) 01/31/2012 1634    Clinical Atherosclerotic Cardiovascular Disease (ASCVD): No  The 10-year ASCVD risk score Mikey Bussing DC Jr., et al., 2013) is: 11.6%   Values used to calculate the score:     Age: 24 years     Sex: Female     Is Non-Hispanic African American: No     Diabetic: Yes     Tobacco smoker: No     Systolic Blood Pressure: 123456 mmHg     Is BP treated: No     HDL Cholesterol: 38 mg/dL     Total Cholesterol: 205 mg/dL    Assessment/Plan:   T2DM is not controlled likely due to suboptimal adherence and patient being reluctant to insulin therapy. Discussed with patient and son the need to continue to titrate up [name] and that patient will likely feel even better as her blood glucose readings become more controlled. Will increase Tresiba from 4 units to 8 units (20%) once daily. Patient hesistant but willing to try. Will consider further dose increases in future but need to adjust slowly due to patient being reluctant. Following instruction patient and son verbalized understanding of treatment plan.    Increased insulin degludec [name]) to 8 units once daily Counseled on s/sx of and management of hypoglycemia Sent in new prescription for glucometer and testing supplies Next A1C anticipated September 2022. Sent in refill for metformin and Januvia  Follow-up appointment 2 weeks to review sugar readings. Written patient instructions provided.  This  appointment required 30 minutes of direct patient care.  Thank you for involving pharmacy to assist in providing this patient's care.

## 2020-10-17 NOTE — Assessment & Plan Note (Signed)
T2DM is not controlled likely due to suboptimal adherence and patient being reluctant to insulin therapy. Discussed with patient and son the need to continue to titrate up [name] and that patient will likely feel even better as her blood glucose readings become more controlled. Will increase Tresiba from 4 units to 8 units (20%) once daily. Patient hesistant but willing to try. Will consider further dose increases in future but need to adjust slowly due to patient being reluctant. Following instruction patient and son verbalized understanding of treatment plan.    1. Increased insulin degludec [name]) to 8 units once daily 2. Counseled on s/sx of and management of hypoglycemia 3. Sent in new prescription for glucometer and testing supplies 4. Next A1C anticipated September 2022.  Follow-up appointment 2 weeks to review sugar readings. Written patient instructions provided.

## 2020-10-17 NOTE — Patient Instructions (Signed)
Ms. Hakim it was a pleasure seeing you today.   Please do the following:  Increase Tresiba to 8 units as directed today during your appointment. If you have any questions or if you believe something has occurred because of this change, call me or your doctor to let one of [place] know.  Continue checking blood sugars at home. It's really important that you record these and bring these in to your next doctor's appointment.  Continue making the lifestyle changes we've discussed together during our visit. Diet and exercise play a significant role in improving your blood sugars.  Follow-up with me in two weeks.    Hypoglycemia or low blood sugar:   Low blood sugar can happen quickly and may become an emergency if not treated right away.   While this shouldn't happen often, it can be brought upon if you skip a meal or do not eat enough. Also, if your insulin or other diabetes medications are dosed too high, this can cause your blood sugar to go to low.   Warning signs of low blood sugar include: Feeling shaky or dizzy Feeling weak or tired  Excessive hunger Feeling anxious or upset  Sweating even when you aren't exercising  What to do if I experience low blood sugar? Follow the Rule of 15 Check your blood sugar with your meter. If lower than 70, proceed to step 2.  Treat with 15 grams of fast acting carbs which is found in 3-4 glucose tablets. If none are available you can try hard candy, 1 tablespoon of sugar or honey,4 ounces of fruit juice, or 6 ounces of REGULAR soda.  Re-check your sugar in 15 minutes. If it is still below 70, do what you did in step 2 again. If your blood sugar has come back up, go ahead and eat a snack or small meal made up of complex carbs (ex. Whole grains) and protein at this time to avoid recurrence of low blood sugar.

## 2020-11-01 ENCOUNTER — Ambulatory Visit (INDEPENDENT_AMBULATORY_CARE_PROVIDER_SITE_OTHER): Payer: Medicare Other | Admitting: Pharmacist

## 2020-11-01 ENCOUNTER — Other Ambulatory Visit: Payer: Self-pay

## 2020-11-01 DIAGNOSIS — E119 Type 2 diabetes mellitus without complications: Secondary | ICD-10-CM

## 2020-11-01 MED ORDER — TRESIBA FLEXTOUCH 100 UNIT/ML ~~LOC~~ SOPN: 12.0000 [IU] | PEN_INJECTOR | Freq: Every day | SUBCUTANEOUS | 0 refills | Status: DC

## 2020-11-01 NOTE — Progress Notes (Signed)
Subjective:    Patient ID: Sandra Ryan, female    DOB: October 05, 1955, 65 y.o.   MRN: [id number]  HPI Patient is a 65 y.o. female who presents for diabetes management. She is in good spirits and presents without assistance. Patient's son presented with her to appt and translated information to patient. Patient was referred and last seen by Primary Care Provider on 09/01/20. Last seen in pharmacy clinic on 10/17/20.  Patient's son reports that he has been continuing to administer insulin to his mother daily. He does communicate that patient has been experiencing some bruising where the administration of the insulin is being placed.   Insurance coverage/medication affordability: Medicare  Current diabetes medications include: insulin degludec [name]) 8 units once daily, metformin '1000mg'$  BID, sitagliptin (Januvia) '100mg'$  daily Current hyperlipidemia medications include: atorvastatin '80mg'$  Patient's son states that She is taking her diabetes medications as prescribed. Patient's son reports adherence with medications.    Patient denies hypoglycemic events.  Patient denies polyuria (increased urination).  Patient denies polyphagia (increased appetite).  Patient denies polydipsia (increased thirst).  Patient reports neuropathy (nerve pain). Patient denies visual changes. Patient denies self foot exams.   Fasting blood glucose: 200-250's (did not bring glucometer)  Objective:   Labs:    Lab Results  Component Value Date   HGBA1C 11.9 (A) 09/01/2020   HGBA1C 11.1 (A) 02/01/2020   HGBA1C 10.8 (A) 10/05/2019    Lab Results  Component Value Date   MICRALBCREAT <14 09/01/2020    Lipid Panel     Component Value Date/Time   CHOL 205 (H) 07/07/2019 1532   TRIG 330 (H) 07/07/2019 1532   HDL 38 (L) 07/07/2019 1532   CHOLHDL 5.4 (H) 07/07/2019 1532   CHOLHDL 4.7 05/04/2015 1057   VLDL 47 (H) 05/04/2015 1057   LDLCALC 110 (H) 07/07/2019 1532   LDLDIRECT 139 (H) 01/31/2012 1634     Clinical Atherosclerotic Cardiovascular Disease (ASCVD): No  The 10-year ASCVD risk score Mikey Bussing DC Jr., et al., 2013) is: 10.2%   Values used to calculate the score:     Age: 60 years     Sex: Female     Is Non-Hispanic African American: No     Diabetic: Yes     Tobacco smoker: No     Systolic Blood Pressure: A999333 mmHg     Is BP treated: No     HDL Cholesterol: 38 mg/dL     Total Cholesterol: 205 mg/dL    Assessment/Plan:   T2DM is not controlled likely due to reluctance in insulin therapy therefore leading to slow titrations. Discussed with patient and son the need to continue to titrate up [name] until she achieves control of blood glucose. Will increase Tresiba from 8 units to 10 units once daily for one week and then have patient's son increase to 12 units until follow-up in clinic one week later. Concerns regarding self-titrating up based on blood glucose therefore will continue with easy titration schedules. Following instruction patient and son verbalized understanding of treatment plan.    Increased insulin degludec [name]) to 10 units once daily for one week and then 12 units once daily for one week Continued metformin '1000mg'$  BID Continued sitagliptin (Januvia) '100mg'$  daily Counseled on s/sx of and management of hypoglycemia Sent in new prescription for glucometer and testing supplies Next A1C anticipated September 2022.  Follow-up appointment 2 weeks to review sugar readings. Written patient instructions provided.  This appointment required 30 minutes of direct patient care.  Thank you for  involving pharmacy to assist in providing this patient's care.

## 2020-11-01 NOTE — Patient Instructions (Addendum)
Sandra Ryan it was a pleasure seeing you today.   Please do the following:  Increase Tresiba to 10 units once a day for 7 days and then increase to 12 units until our next visit as directed today during your appointment. If you have any questions or if you believe something has occurred because of this change, call me or your doctor to let one of [place] know.  Continue checking blood sugars at home. It's really important that you record these and bring these in to your next doctor's appointment.  Continue making the lifestyle changes we've discussed together during our visit. Diet and exercise play a significant role in improving your blood sugars.  Follow-up with me in two weeks.   Hypoglycemia or low blood sugar:   Low blood sugar can happen quickly and may become an emergency if not treated right away.   While this shouldn't happen often, it can be brought upon if you skip a meal or do not eat enough. Also, if your insulin or other diabetes medications are dosed too high, this can cause your blood sugar to go to low.   Warning signs of low blood sugar include: Feeling shaky or dizzy Feeling weak or tired  Excessive hunger Feeling anxious or upset  Sweating even when you aren't exercising  What to do if I experience low blood sugar? Follow the Rule of 15 Check your blood sugar with your meter. If lower than 70, proceed to step 2.  Treat with 15 grams of fast acting carbs which is found in 3-4 glucose tablets. If none are available you can try hard candy, 1 tablespoon of sugar or honey,4 ounces of fruit juice, or 6 ounces of REGULAR soda.  Re-check your sugar in 15 minutes. If it is still below 70, do what you did in step 2 again. If your blood sugar has come back up, go ahead and eat a snack or small meal made up of complex carbs (ex. Whole grains) and protein at this time to avoid recurrence of low blood sugar.

## 2020-11-02 NOTE — Assessment & Plan Note (Signed)
T2DM is not controlled likely due to reluctance in insulin therapy therefore leading to slow titrations. Discussed with patient and son the need to continue to titrate up [name] until she achieves control of blood glucose. Will increase Tresiba from 8 units to 10 units once daily for one week and then have patient's son increase to 12 units until follow-up in clinic one week later. Concerns regarding self-titrating up based on blood glucose therefore will continue with easy titration schedules. Following instruction patient and son verbalized understanding of treatment plan.    1. Increased insulin degludec [name]) to 10 units once daily for one week and then 12 units once daily for one week 2. Continued metformin '1000mg'$  BID 3. Continued sitagliptin (Januvia) '100mg'$  daily 4. Counseled on s/sx of and management of hypoglycemia 5. Sent in new prescription for glucometer and testing supplies 6. Next A1C anticipated September 2022.  Follow-up appointment 2 weeks to review sugar readings. Written patient instructions provided.

## 2020-11-14 ENCOUNTER — Other Ambulatory Visit: Payer: Self-pay

## 2020-11-14 ENCOUNTER — Ambulatory Visit (INDEPENDENT_AMBULATORY_CARE_PROVIDER_SITE_OTHER): Payer: Medicare Other | Admitting: Pharmacist

## 2020-11-14 DIAGNOSIS — E119 Type 2 diabetes mellitus without complications: Secondary | ICD-10-CM

## 2020-11-14 MED ORDER — RYBELSUS 3 MG PO TABS: 3.0000 mg | ORAL_TABLET | Freq: Every day | ORAL | 0 refills | Status: DC

## 2020-11-14 MED ORDER — TRESIBA FLEXTOUCH 100 UNIT/ML ~~LOC~~ SOPN: 16.0000 [IU] | PEN_INJECTOR | Freq: Every day | SUBCUTANEOUS | 0 refills | Status: DC

## 2020-11-14 NOTE — Patient Instructions (Signed)
Ms. Mongar it was a pleasure seeing you today.   Please do the following:  Start taking Rybelsus and stop taking Januvia as directed today during your appointment. Increase Tresiba to 16 units once daily If you have any questions or if you believe something has occurred because of this change, call me or your doctor to let one of [place] know.  Continue checking blood sugars at home. It's really important that you record these and bring these in to your next doctor's appointment.  Continue making the lifestyle changes we've discussed together during our visit. Diet and exercise play a significant role in improving your blood sugars.  Follow-up with me in one month.    Hypoglycemia or low blood sugar:   Low blood sugar can happen quickly and may become an emergency if not treated right away.   While this shouldn't happen often, it can be brought upon if you skip a meal or do not eat enough. Also, if your insulin or other diabetes medications are dosed too high, this can cause your blood sugar to go to low.   Warning signs of low blood sugar include: Feeling shaky or dizzy Feeling weak or tired  Excessive hunger Feeling anxious or upset  Sweating even when you aren't exercising  What to do if I experience low blood sugar? Follow the Rule of 15 Check your blood sugar with your meter. If lower than 70, proceed to step 2.  Treat with 15 grams of fast acting carbs which is found in 3-4 glucose tablets. If none are available you can try hard candy, 1 tablespoon of sugar or honey,4 ounces of fruit juice, or 6 ounces of REGULAR soda.  Re-check your sugar in 15 minutes. If it is still below 70, do what you did in step 2 again. If your blood sugar has come back up, go ahead and eat a snack or small meal made up of complex carbs (ex. Whole grains) and protein at this time to avoid recurrence of low blood sugar.

## 2020-11-14 NOTE — Progress Notes (Signed)
Subjective:    Patient ID: Sandra Ryan, female    DOB: 1955-08-28, 65 y.o.   MRN: [id number]  HPI Patient is a 65 y.o. female who presents for diabetes management. She is in good spirits and presents without assistance. Patient's son presented with her to appt and translated information to patient. Patient was referred and last seen by Primary Care Provider on 09/01/20. Last seen in pharmacy clinic on 11/01/20.  Patient's son reports that he has been continuing to administer insulin to his mother daily.  Insurance coverage/medication affordability: Medicare  Current diabetes medications include: insulin degludec [name]) 12 units once daily, metformin '1000mg'$  BID, sitagliptin (Januvia) '100mg'$  daily Current hyperlipidemia medications include: atorvastatin '80mg'$  Patient's son states that She is taking her diabetes medications as prescribed. Patient's son reports adherence with medications.    Patient denies hypoglycemic events.  Patient denies polyuria (increased urination).  Patient denies polyphagia (increased appetite).  Patient denies polydipsia (increased thirst).  Patient reports neuropathy (nerve pain). Patient denies visual changes. Patient denies self foot exams.    Objective:   14 day blood glucose avg: 221 Fasting blood glucose readings: 287, 206, 160,  Random blood glucose readings: 222, 217, 223  Labs:    Lab Results  Component Value Date   HGBA1C 11.9 (A) 09/01/2020   HGBA1C 11.1 (A) 02/01/2020   HGBA1C 10.8 (A) 10/05/2019    Lab Results  Component Value Date   MICRALBCREAT <14 09/01/2020    Lipid Panel     Component Value Date/Time   CHOL 205 (H) 07/07/2019 1532   TRIG 330 (H) 07/07/2019 1532   HDL 38 (L) 07/07/2019 1532   CHOLHDL 5.4 (H) 07/07/2019 1532   CHOLHDL 4.7 05/04/2015 1057   VLDL 47 (H) 05/04/2015 1057   LDLCALC 110 (H) 07/07/2019 1532   LDLDIRECT 139 (H) 01/31/2012 1634    Clinical Atherosclerotic Cardiovascular Disease (ASCVD): No   The 10-year ASCVD risk score Mikey Bussing DC Jr., et al., 2013) is: 10.2%   Values used to calculate the score:     Age: 39 years     Sex: Female     Is Non-Hispanic African American: No     Diabetic: Yes     Tobacco smoker: No     Systolic Blood Pressure: A999333 mmHg     Is BP treated: No     HDL Cholesterol: 38 mg/dL     Total Cholesterol: 205 mg/dL    Assessment/Plan:   T2DM is not controlled likely due to continued reluctance with changes in therapy. Discussed with patient and son the need to continue to titrate up [name] until she achieves control of blood glucose. Will increase Tresiba from 12 units to 16 units once daily. Concerns regarding self-titrating up based on blood glucose therefore will continue with easy titration schedules. Also discussed potential added benefits of glycemic control from switching DPP4 to GLP1. Patient not agreeable to once weekly injectable but after discussion is agreeable to trying Rybelsus in place of Januvia. Patient previously on Trulicity and reports not tolerating and experiencing dizziness. Will send in Rybelsus to pharmacy to see if able to be covered. Following instruction patient and son verbalized understanding of treatment plan.    Increased insulin degludec [name]) to 16 units once daily for one week and then 12 units once daily for one week Continued metformin '1000mg'$  BID Discontinued sitagliptin (Januvia) '100mg'$  daily Started Rybelsus '3mg'$  daily Counseled on s/sx of and management of hypoglycemia Sent in new prescription for glucometer and testing supplies  Next A1C anticipated September 2022.  Follow-up appointment 3 weeks to review sugar readings. Written patient instructions provided.  This appointment required 30 minutes of direct patient care.  Thank you for involving pharmacy to assist in providing this patient's care.  Patient seen with Meyer Russel, PharmD Candidate

## 2020-11-15 NOTE — Assessment & Plan Note (Signed)
T2DM is not controlled likely due to continued reluctance with changes in therapy. Discussed with patient and son the need to continue to titrate up [name] until she achieves control of blood glucose. Will increase Tresiba from 12 units to 16 units once daily. Concerns regarding self-titrating up based on blood glucose therefore will continue with easy titration schedules. Also discussed potential added benefits of glycemic control from switching DPP4 to GLP1. Patient not agreeable to once weekly injectable but after discussion is agreeable to trying Rybelsus in place of Januvia. Patient previously on Trulicity and reports not tolerating and experiencing dizziness. Will send in Rybelsus to pharmacy to see if able to be covered. Following instruction patient and son verbalized understanding of treatment plan.    1. Increased insulin degludec [name]) to 16 units once daily for one week and then 12 units once daily for one week 2. Continued metformin '1000mg'$  BID 3. Discontinued sitagliptin (Januvia) '100mg'$  daily 4. Started Rybelsus '3mg'$  daily 5. Counseled on s/sx of and management of hypoglycemia 6. Sent in new prescription for glucometer and testing supplies 7. Next A1C anticipated September 2022.  Follow-up appointment 3 weeks to review sugar readings.Written patient instructions provided.

## 2020-12-07 ENCOUNTER — Ambulatory Visit (INDEPENDENT_AMBULATORY_CARE_PROVIDER_SITE_OTHER): Payer: Medicare Other | Admitting: Pharmacist

## 2020-12-07 ENCOUNTER — Other Ambulatory Visit: Payer: Self-pay

## 2020-12-07 DIAGNOSIS — E118 Type 2 diabetes mellitus with unspecified complications: Secondary | ICD-10-CM | POA: Diagnosis present

## 2020-12-07 DIAGNOSIS — E119 Type 2 diabetes mellitus without complications: Secondary | ICD-10-CM

## 2020-12-07 LAB — POCT GLYCOSYLATED HEMOGLOBIN (HGB A1C): HbA1c, POC (controlled diabetic range): 10.6 % — AB (ref 0.0–7.0)

## 2020-12-07 MED ORDER — ACCU-CHEK SOFTCLIX LANCET DEV KIT: PACK | 0 refills | Status: DC

## 2020-12-07 MED ORDER — ACCU-CHEK GUIDE VI STRP: ORAL_STRIP | 12 refills | Status: DC

## 2020-12-07 NOTE — Progress Notes (Signed)
Subjective:    Patient ID: Sandra Ryan, female    DOB: [birth year], 65 y.o.   MRN: [id number]  HPI Patient is a 65 y.o. female who presents for diabetes management. She is in good spirits and presents without assistance. Patient's son presented with her to appt and translated information to patient. Patient was referred and last seen by Primary Care Provider on 09/01/20. Last seen in pharmacy clinic on 11/14/20.  Patient picked up Rybelsus prescription that was prescribed at last visit. Patient reports not having not started Rybelsus due to being unsure if it was correct. She continues to take Januvia. She continues taking her [name] and is apprehensive of increasing the dose due to risk of having hypoglycemia. Patient's son reports that he has been continuing to administer insulin to his mother daily, but only 12 units as patient was not comfortable going up to 16 units. Patient reports being out of refills for test strips and needing new lancing device.   Patient mentions that her joints are feeling more achy and reports having more bone pain, which she attributes to her insulin.   Insurance coverage/medication affordability: Medicare  Current diabetes medications include: insulin degludec [name]) 12 units once daily, metformin '1000mg'$  BID, sitagliptin (Januvia) '100mg'$  daily Current hyperlipidemia medications include: atorvastatin '80mg'$  Patient's son states that She is taking her diabetes medications as prescribed. Patient's son reports adherence with medications.    Patient denies hypoglycemic events.  Patient endorses polyuria (increased urination). - Reports waking up twice at night Patient endorses polyphagia (increased appetite). - Reports feeling more hungry but also feeling dizzy after eating Patient denies polydipsia (increased thirst).  Patient reports neuropathy (nerve pain). - Reports feeling like her feet are burning Patient denies visual changes. - Reports sometimes having watery  eyes Patient endorses self foot exams.   Objective:   14 day blood glucose avg: 216 Fasting/Random blood glucose readings: 132, 300, 258, 254, 287   Labs:    Lab Results  Component Value Date   HGBA1C 10.6 (A) 12/07/2020   HGBA1C 11.9 (A) 09/01/2020   HGBA1C 11.1 (A) 02/01/2020    Lab Results  Component Value Date   MICRALBCREAT <14 09/01/2020    Lipid Panel     Component Value Date/Time   CHOL 205 (H) 07/07/2019 1532   TRIG 330 (H) 07/07/2019 1532   HDL 38 (L) 07/07/2019 1532   CHOLHDL 5.4 (H) 07/07/2019 1532   CHOLHDL 4.7 05/04/2015 1057   VLDL 47 (H) 05/04/2015 1057   LDLCALC 110 (H) 07/07/2019 1532   LDLDIRECT 139 (H) 01/31/2012 1634    Clinical Atherosclerotic Cardiovascular Disease (ASCVD): No  The 10-year ASCVD risk score (Arnett DK, et al., 2019) is: 10.2%   Values used to calculate the score:     Age: 31 years     Sex: Female     Is Non-Hispanic African American: No     Diabetic: Yes     Tobacco smoker: No     Systolic Blood Pressure: A999333 mmHg     Is BP treated: No     HDL Cholesterol: 38 mg/dL     Total Cholesterol: 205 mg/dL    Assessment/Plan:   T2DM is not controlled likely due to continued reluctance with changes in therapy. A1c due today. Discussed with patient and son the need to continue to titrate up [name] until she achieves control of blood glucose. Patient agreeable to increase Tresiba from 12 units to 16 units once daily. Concerns regarding self-titrating up based on  blood glucose therefore will continue with easy titration schedules. Discussed with patient that symptoms she is experiencing likely could be from uncontrolled blood sugars. Reminded patient to being Rybelsus '3mg'$  once daily and discontinue Januvia 25 mg daily. Will send prescriptions to pharmacy for lancet device and test strips.  Increased insulin degludec [name]) to 16 units once daily  Continued metformin '1000mg'$  BID Discontinued sitagliptin (Januvia) '100mg'$  daily Started  Rybelsus '3mg'$  daily Counseled on s/sx of and management of hypoglycemia Sent in new prescription for glucometer and testing supplies A1c ordered and collected today  Discussed with patient that aging can cause some of the joint and muscle pain she is experiencing. Will discuss with PCP, Dr. Posey Pronto, need for DEXA scan in the future.   Follow-up appointment 1 month to review sugar readings. Written patient instructions provided.  This appointment required 30 minutes of direct patient care.  Thank you for involving pharmacy to assist in providing this patient's care.  Patient seen with Meyer Russel, PharmD Candidate, Elita Quick, PharmD - PGY1 Pharmacy Resident, Rebbeca Paul, PharmD - PGY2 Pharmacy Resident

## 2020-12-07 NOTE — Patient Instructions (Addendum)
Sandra Ryan it was a pleasure seeing you today.   Please do the following:  Stop taking your Januvia today. Start taking Rybelsus 1 tablet (3 mg) once a day. Increase Tresiba to 16 units once a day as directed today during your appointment. If you have any questions or if you believe something has occurred because of this change, call me or your doctor to let one of [place] know.  Continue checking blood sugars at home. It's really important that you record these and bring these in to your next doctor's appointment.  Continue making the lifestyle changes we've discussed together during our visit. Diet and exercise play a significant role in improving your blood sugars.  Follow-up with me in a month.    Hypoglycemia or low blood sugar:   Low blood sugar can happen quickly and may become an emergency if not treated right away.   While this shouldn't happen often, it can be brought upon if you skip a meal or do not eat enough. Also, if your insulin or other diabetes medications are dosed too high, this can cause your blood sugar to go to low.   Warning signs of low blood sugar include: Feeling shaky or dizzy Feeling weak or tired  Excessive hunger Feeling anxious or upset  Sweating even when you aren't exercising  What to do if I experience low blood sugar? Follow the Rule of 15 Check your blood sugar with your meter. If lower than 70, proceed to step 2.  Treat with 15 grams of fast acting carbs which is found in 3-4 glucose tablets. If none are available you can try hard candy, 1 tablespoon of sugar or honey,4 ounces of fruit juice, or 6 ounces of REGULAR soda.  Re-check your sugar in 15 minutes. If it is still below 70, do what you did in step 2 again. If your blood sugar has come back up, go ahead and eat a snack or small meal made up of complex carbs (ex. Whole grains) and protein at this time to avoid recurrence of low blood sugar.

## 2020-12-07 NOTE — Assessment & Plan Note (Signed)
T2DM is not controlled likely due to continued reluctance with changes in therapy. A1c due today. Discussed with patient and son the need to continue to titrate up [name] until she achieves control of blood glucose. Patient agreeable to increase Tresiba from 12 units to 16 units once daily. Concerns regarding self-titrating up based on blood glucose therefore will continue with easy titration schedules. Discussed with patient that symptoms she is experiencing likely could be from uncontrolled blood sugars. Reminded patient to being Rybelsus '3mg'$  once daily and discontinue Januvia 25 mg daily. Will send prescriptions to pharmacy for lancet device and test strips.  1. Increased insulin degludec [name]) to 16 units once daily  2. Continued metformin '1000mg'$  BID 3. Discontinued sitagliptin (Januvia) '100mg'$  daily 4. Started Rybelsus '3mg'$  daily 5. Counseled on s/sx of and management of hypoglycemia 6. Sent in new prescription for glucometer and testing supplies 7. A1c ordered and collected today

## 2020-12-08 ENCOUNTER — Other Ambulatory Visit (HOSPITAL_COMMUNITY): Payer: Self-pay

## 2020-12-12 ENCOUNTER — Other Ambulatory Visit: Payer: Self-pay | Admitting: Family Medicine

## 2020-12-18 NOTE — Progress Notes (Signed)
     SUBJECTIVE:   CHIEF COMPLAINT / HPI:   Sandra Ryan is a 65 y.o. female presents for diabetes follow up  A Nepali speaking interpretor was used for this encounter.    Joint pains Pt reports pain in elbows worse on lifting. She also reports bilateral knee pain when sitting for prolonged periods. The pain has been on going for 3 months. Has tried diclofenac gel in the past but stopped taking it to due to the smell. Does feel better when she takes tylenol. Denies other joint pains. Denies trauma   PERTINENT  PMH / PSH: Diabetes, hyperlipidemia  OBJECTIVE:   BP 105/70   Pulse 93   Wt 128 lb 12.8 oz (58.4 kg)   SpO2 98%   BMI 24.34 kg/m    General: Alert, no acute distress Cardio: Normal S1 and S2, RRR, no r/m/g Pulm: CTAB, normal work of breathing Abdomen: Bowel sounds normal. Abdomen soft and non-tender.  Extremities: No peripheral edema.  Neuro: Cranial nerves grossly intact   Elbows: - Inspection: no obvious deformity. No swelling, erythema or bruising - Palpation: No TTP - ROM: full active ROM in flexion and extension. No crepitus - Strength: 5/5 strength in wrist flexion and extension without pain. 5/5 strength in biceps, triceps.   Knees: - Inspection: no gross deformity. No swelling/effusion, erythema or bruising. 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  Normal bilateral knee exam today.  Recommended Tylenol and Voltaren gel as needed for the pain as patient reports the pain is mild and not causing her too much discomfort.  Will obtain bilateral knee x-rays.  If no improvement could consider steroid injections.  Offered  patient physical therapy however she declined.  Bilateral elbow joint pain Elbow pain likely secondary to degenerative disease/overuse injury.  Low suspicion for septic joint/gout/dislocation. Normal elbow exam today.  Recommended as needed Tylenol for the pain. Would like to rule out fracture given age. Ordered bilateral elbow x-rays.  Healthcare maintenance Patient received flu vaccine today.  Obtained BMP today.  Hypercholesteremia Obtain lipid panel today.  Continue atorvastatin 80 mg.    [name], MD PGY-3 [place]

## 2020-12-19 ENCOUNTER — Other Ambulatory Visit: Payer: Self-pay

## 2020-12-19 ENCOUNTER — Ambulatory Visit (INDEPENDENT_AMBULATORY_CARE_PROVIDER_SITE_OTHER): Payer: Medicare Other | Admitting: Family Medicine

## 2020-12-19 VITALS — BP 105/70 | HR 93 | Wt 128.8 lb

## 2020-12-19 DIAGNOSIS — Z0000 Encounter for general adult medical examination without abnormal findings: Secondary | ICD-10-CM

## 2020-12-19 DIAGNOSIS — M25521 Pain in right elbow: Secondary | ICD-10-CM

## 2020-12-19 DIAGNOSIS — E7800 Pure hypercholesterolemia, unspecified: Secondary | ICD-10-CM | POA: Diagnosis not present

## 2020-12-19 DIAGNOSIS — M25522 Pain in left elbow: Secondary | ICD-10-CM

## 2020-12-19 DIAGNOSIS — M25561 Pain in right knee: Secondary | ICD-10-CM

## 2020-12-19 DIAGNOSIS — Z23 Encounter for immunization: Secondary | ICD-10-CM

## 2020-12-19 DIAGNOSIS — M25562 Pain in left knee: Secondary | ICD-10-CM

## 2020-12-19 DIAGNOSIS — E119 Type 2 diabetes mellitus without complications: Secondary | ICD-10-CM

## 2020-12-19 DIAGNOSIS — G8929 Other chronic pain: Secondary | ICD-10-CM

## 2020-12-19 DIAGNOSIS — E785 Hyperlipidemia, unspecified: Secondary | ICD-10-CM | POA: Diagnosis not present

## 2020-12-19 MED ORDER — TRESIBA FLEXTOUCH 100 UNIT/ML ~~LOC~~ SOPN: 16.0000 [IU] | PEN_INJECTOR | Freq: Every day | SUBCUTANEOUS | 3 refills | Status: DC

## 2020-12-19 NOTE — Patient Instructions (Signed)
?? ???? ????? ?????????? ???????? ?? ?? ????? ????? ?? ?????? ?????? ?????? ? ??????? ??????? ?????? ???? ??????? ?????? ????? ????? ? ???????? ?????????? ?? ???? ??? ????? ????? ?????? ?????? ??????? ?????? ??? ????? ? ???????? ??????   tylenol ??????? ??????  ???? ?????? ? ??????? ???????? ????? ????? ??? ?? ???? ???? ????? [address] [place] ?????? ???????? ??????????? ???????? ????????? ???????? ?????????????? ???????? ???, ?? ??? ????? ?????????? ????? ?? ?? ???? ?????????? ???, ????????? ????? [phone] ??? ???? ??????? ????? ??? ???????? ??????? ?????????  ???? ?? ???? ????????????? ??????? ????????? ??? ??????? ???????? ??? ?? ?????? ????????? ?????? ???? ???? ?????? ? ???, ? ???????? ?? ???????? ??? ??????? ??????? ??? ???, ? ???????? MyChart ?? ?????? ???????? (??? ?? ?????? ?) ?? ????? ???? ????? ??? ??????? 2 ??????? ??????? ????????? ???, ????? ???? ??????? ?????????? ?? ??????????  ????? ???????? ?????? ????? ?????????  ??? ???????? ???? ?????? ?? ????????? ??? ???, ????? [phone] ?? ??????????? ?? ???? ????????????????  ????????,  ?? ????     Thank you for coming to see me today. It was a pleasure. Today we discussed your elbow and knee pains. The elbow pain is probably due to arthritis and repetitive movements. The knee pain is probably due to arthritis. I  recommend tylenol as needed.   I have placed an order for elbow and knee xrays .  Please go to [place] at [employer] [facility] or at [facility] to have this completed.  You do not need an appointment, but if you would like to call them beforehand, their number is [phone].  We will contact you with your results afterwards.   We will get some labs today.  If they are abnormal or we need to do something about them, I will call you.  If they are normal, I will send you a message on MyChart (if it is active) or a letter in the mail.  If you don't hear from [place] in 2 weeks, please call the  office at the number below.   Please follow-up with as needed   If you have any questions or concerns, please do not hesitate to call the office at [phone].  Best wishes,   Dr Posey Pronto

## 2020-12-20 DIAGNOSIS — M25562 Pain in left knee: Secondary | ICD-10-CM | POA: Insufficient documentation

## 2020-12-20 DIAGNOSIS — M25521 Pain in right elbow: Secondary | ICD-10-CM | POA: Insufficient documentation

## 2020-12-20 DIAGNOSIS — M25561 Pain in right knee: Secondary | ICD-10-CM | POA: Insufficient documentation

## 2020-12-20 DIAGNOSIS — M25522 Pain in left elbow: Secondary | ICD-10-CM | POA: Insufficient documentation

## 2020-12-20 LAB — BASIC METABOLIC PANEL
BUN/Creatinine Ratio: 10 — ABNORMAL LOW (ref 12–28)
BUN: 7 mg/dL — ABNORMAL LOW (ref 8–27)
CO2: 24 mmol/L (ref 20–29)
Calcium: 9.4 mg/dL (ref 8.7–10.3)
Chloride: 104 mmol/L (ref 96–106)
Creatinine, Ser: 0.68 mg/dL (ref 0.57–1.00)
Glucose: 152 mg/dL — ABNORMAL HIGH (ref 70–99)
Potassium: 4.8 mmol/L (ref 3.5–5.2)
Sodium: 141 mmol/L (ref 134–144)
eGFR: 96 mL/min/{1.73_m2} (ref >59)

## 2020-12-20 LAB — LIPID PANEL
Chol/HDL Ratio: 3.1 ratio (ref 0.0–4.4)
Cholesterol, Total: 113 mg/dL (ref 100–199)
HDL: 37 mg/dL — ABNORMAL LOW (ref >39)
LDL Chol Calc (NIH): 57 mg/dL (ref 0–99)
Triglycerides: 102 mg/dL (ref 0–149)
VLDL Cholesterol Cal: 19 mg/dL (ref 5–40)

## 2020-12-20 NOTE — Assessment & Plan Note (Signed)
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  Normal bilateral knee exam today.  Recommended Tylenol and Voltaren gel as needed for the pain as patient reports the pain is mild and not causing her too much discomfort.  Will obtain bilateral knee x-rays.  If no improvement could consider steroid injections.  Offered patient physical therapy however she declined.

## 2020-12-20 NOTE — Assessment & Plan Note (Signed)
Obtain lipid panel today.  Continue atorvastatin 80 mg.

## 2020-12-20 NOTE — Assessment & Plan Note (Addendum)
Elbow pain likely secondary to degenerative disease/overuse injury.  Low suspicion for septic joint/gout/dislocation. Normal elbow exam today.  Recommended as needed Tylenol for the pain. Would like to rule out fracture given age. Ordered bilateral elbow x-rays.

## 2020-12-20 NOTE — Assessment & Plan Note (Addendum)
Patient received flu vaccine today.  Obtained BMP today.

## 2021-01-04 ENCOUNTER — Other Ambulatory Visit: Payer: Self-pay

## 2021-01-04 ENCOUNTER — Ambulatory Visit (INDEPENDENT_AMBULATORY_CARE_PROVIDER_SITE_OTHER): Payer: Medicare Other | Admitting: Pharmacist

## 2021-01-04 DIAGNOSIS — E118 Type 2 diabetes mellitus with unspecified complications: Secondary | ICD-10-CM | POA: Diagnosis not present

## 2021-01-04 DIAGNOSIS — E119 Type 2 diabetes mellitus without complications: Secondary | ICD-10-CM | POA: Diagnosis not present

## 2021-01-04 MED ORDER — RYBELSUS 7 MG PO TABS: ORAL_TABLET | ORAL | 0 refills | Status: DC

## 2021-01-04 MED ORDER — ACCU-CHEK SOFTCLIX LANCETS MISC: 11 refills | Status: DC

## 2021-01-04 MED ORDER — INSULIN PEN NEEDLE 32G X 4 MM MISC: 0 refills | Status: AC

## 2021-01-04 NOTE — Progress Notes (Signed)
Subjective:    Patient ID: Sandra Ryan, female    DOB: Jul 31, 1955, 64 y.o.   MRN: [id number]  HPI Patient is a 65 y.o. female who presents for diabetes management. She is in good spirits and presents without assistance. Patient's son presented with her to appt and translated information to patient. Patient was referred and last seen by Primary Care Provider on 12/19/20. Last seen in pharmacy clinic on 12/07/20.  Patient reports that since our last visit she has begun taking Rybelsus, but is requesting to go back to [facility] as she states she is feeling less thirsty and has a decreased appetite. Patient did not increase Tresiba from 12 units to 16 as we previously discussed because she reports feeling it is too much insulin. Patient brought up concern again surrounding skin feeling thinner and worrying it is due to her insulin. Patient has been out of test strips and lancets therefore has not checked blood glucose in 3 weeks.   Insurance coverage/medication affordability: Medicare  Current diabetes medications include: insulin degludec [name]) 12 units once daily, metformin 1000mg  BID, Rybelsus 3mg  once daily Current hyperlipidemia medications include: atorvastatin 80mg  Patient's son states that She is taking her diabetes medications as prescribed. Patient's son reports adherence with medications.    Patient denies hypoglycemic events.  Patient endorses polyuria (increased urination).  Patient denies polyphagia (increased appetite). Patient denies polydipsia (increased thirst).  Patient reports neuropathy (nerve pain). - Reports feeling like her feet are burning Patient denies visual changes. - Reports sometimes having watery eyes Patient endorses self foot exams.   Objective:    Labs:    Lab Results  Component Value Date   HGBA1C 10.6 (A) 12/07/2020   HGBA1C 11.9 (A) 09/01/2020   HGBA1C 11.1 (A) 02/01/2020    Lab Results  Component Value Date   MICRALBCREAT <14 09/01/2020     Lipid Panel     Component Value Date/Time   CHOL 113 12/19/2020 1526   TRIG 102 12/19/2020 1526   HDL 37 (L) 12/19/2020 1526   CHOLHDL 3.1 12/19/2020 1526   CHOLHDL 4.7 05/04/2015 1057   VLDL 47 (H) 05/04/2015 1057   LDLCALC 57 12/19/2020 1526   LDLDIRECT 139 (H) 01/31/2012 1634    Clinical Atherosclerotic Cardiovascular Disease (ASCVD): No  The ASCVD Risk score (Arnett DK, et al., 2019) failed to calculate for the following reasons:   The valid total cholesterol range is 130 to 320 mg/dL    Assessment/Plan:   T2DM is not controlled likely due to continued reluctance with changes in therapy. Reminded patient and son the need to continue to titrate up [name] until she achieves control of blood glucose. Patient agreeable to increase Tresiba from 12 units to 14 units once daily. Concerns regarding self-titrating up based on blood glucose therefore will continue with easy titration schedules. Discussed with patient that symptoms she is experiencing regarding decreased thirst and hunger are related likely partly to Rybelsus and also partly too an improvement in blood glucose although cannot confirm without any readings. Re-assured patient "thin skin" was not in relation to her insulin. Patient also agreeable to increase Rybelsus from 3mg  to 7mg . Will re-send prescriptions to pharmacy for lancet device and test strips.  Increased insulin degludec [name]) to 14 units once daily  Continued metformin 1000mg  BID Increased Rybelsus to 7mg  daily Counseled on s/sx of and management of hypoglycemia Sent in new prescription for glucometer and testing supplies Next anticipated A1C due December 2022  Follow-up appointment 3 weeks to review  sugar readings. Written patient instructions provided.  This appointment required 30 minutes of direct patient care.  Thank you for involving pharmacy to assist in providing this patient's care.  Patient seen with Elyse Jarvis, PharmD Candidate.

## 2021-01-04 NOTE — Patient Instructions (Signed)
Ms. Brazell it was a pleasure seeing you today.   Please do the following:  Increase Tresiba to 14 units once daily as directed today during your appointment. If you have any questions or if you believe something has occurred because of this change, call me or your doctor to let one of [place] know.  Finish your current bottle of Rybelsus and then start your new bottle of 7mg  Continue checking blood sugars at home. It's really important that you record these and bring these in to your next doctor's appointment.  Continue making the lifestyle changes we've discussed together during our visit. Diet and exercise play a significant role in improving your blood sugars.  Follow-up with me in three weeks.    Hypoglycemia or low blood sugar:   Low blood sugar can happen quickly and may become an emergency if not treated right away.   While this shouldn't happen often, it can be brought upon if you skip a meal or do not eat enough. Also, if your insulin or other diabetes medications are dosed too high, this can cause your blood sugar to go to low.   Warning signs of low blood sugar include: Feeling shaky or dizzy Feeling weak or tired  Excessive hunger Feeling anxious or upset  Sweating even when you aren't exercising  What to do if I experience low blood sugar? Follow the Rule of 15 Check your blood sugar with your meter. If lower than 70, proceed to step 2.  Treat with 15 grams of fast acting carbs which is found in 3-4 glucose tablets. If none are available you can try hard candy, 1 tablespoon of sugar or honey,4 ounces of fruit juice, or 6 ounces of REGULAR soda.  Re-check your sugar in 15 minutes. If it is still below 70, do what you did in step 2 again. If your blood sugar has come back up, go ahead and eat a snack or small meal made up of complex carbs (ex. Whole grains) and protein at this time to avoid recurrence of low blood sugar.

## 2021-01-04 NOTE — Assessment & Plan Note (Signed)
  T2DM is not controlled likely due to continued reluctance with changes in therapy. Reminded patient and son the need to continue to titrate up [name] until she achieves control of blood glucose. Patient agreeable to increase Tresiba from 12 units to 14 units once daily. Concerns regarding self-titrating up based on blood glucose therefore will continue with easy titration schedules. Discussed with patient that symptoms she is experiencing regarding decreased thirst and hunger are related likely partly to Rybelsus and also partly too an improvement in blood glucose although cannot confirm without any readings. Re-assured patient "thin skin" was not in relation to her insulin. Patient also agreeable to increase Rybelsus from 3mg  to 7mg . Will re-send prescriptions to pharmacy for lancet device and test strips.  1. Increased insulin degludec [name]) to 14 units once daily  2. Continued metformin 1000mg  BID 3. Increased Rybelsus to 7mg  daily 4. Counseled on s/sx of and management of hypoglycemia 5. Sent in new prescription for glucometer and testing supplies 6. Next anticipated A1C due December 2022  Follow-up appointment 3 weeks to review sugar readings. Written patient instructions provided.

## 2021-01-23 ENCOUNTER — Other Ambulatory Visit: Payer: Self-pay | Admitting: Family Medicine

## 2021-01-23 DIAGNOSIS — K219 Gastro-esophageal reflux disease without esophagitis: Secondary | ICD-10-CM

## 2021-01-24 ENCOUNTER — Ambulatory Visit: Payer: Medicare Other | Admitting: Pharmacist

## 2021-01-26 ENCOUNTER — Other Ambulatory Visit: Payer: Self-pay | Admitting: Family Medicine

## 2021-02-04 ENCOUNTER — Other Ambulatory Visit: Payer: Self-pay | Admitting: Family Medicine

## 2021-02-12 ENCOUNTER — Ambulatory Visit: Payer: Medicare Other | Admitting: Pharmacist

## 2021-02-26 ENCOUNTER — Other Ambulatory Visit: Payer: Self-pay

## 2021-02-26 ENCOUNTER — Ambulatory Visit (INDEPENDENT_AMBULATORY_CARE_PROVIDER_SITE_OTHER): Payer: Medicare Other | Admitting: Pharmacist

## 2021-02-26 DIAGNOSIS — E1169 Type 2 diabetes mellitus with other specified complication: Secondary | ICD-10-CM | POA: Diagnosis not present

## 2021-02-26 DIAGNOSIS — E785 Hyperlipidemia, unspecified: Secondary | ICD-10-CM

## 2021-02-26 DIAGNOSIS — E119 Type 2 diabetes mellitus without complications: Secondary | ICD-10-CM | POA: Diagnosis not present

## 2021-02-26 MED ORDER — ACCU-CHEK SOFTCLIX LANCET DEV KIT: PACK | 0 refills | Status: DC

## 2021-02-26 MED ORDER — ATORVASTATIN CALCIUM 80 MG PO TABS: 80.0000 mg | ORAL_TABLET | Freq: Every day | ORAL | 0 refills | Status: DC

## 2021-02-26 MED ORDER — EMPAGLIFLOZIN 10 MG PO TABS: 10.0000 mg | ORAL_TABLET | Freq: Every day | ORAL | 0 refills | Status: DC

## 2021-02-26 NOTE — Progress Notes (Signed)
Subjective:    Patient ID: Sandra Ryan, female    DOB: 03-15-1956, 65 y.o.   MRN: [id number]  HPI Patient is a 65 y.o. female who presents for diabetes management. She is in good spirits and presents without assistance. Patient's son presented with her to appt and translated information to patient. Patient was referred and last seen by Primary Care Provider on 12/19/20. Last seen in pharmacy clinic on 01/04/21.  Patient reports that her blood glucose readings have been better but that she had to stop the Rybelsus as it made her stomach and back hurt and she also felt she continued to be less thirsty with a decreased appetite. She has not taken the medication in about a week. She also has not been able to check her blood glucose recently as her lancing device broke.  Insurance coverage/medication affordability: Medicare  Current diabetes medications include: insulin degludec [name]) 14 units once daily, metformin 1000mg  BID, Rybelsus 7mg  once daily (patient stopped 1 week ago) Current hyperlipidemia medications include: atorvastatin 80mg  Patient's son states that She is taking her diabetes medications as prescribed. Patient's son reports adherence with medications.    Patient denies hypoglycemic events.  Patient denies polyuria (increased urination).  Patient denies polyphagia (increased appetite). Patient denies polydipsia (increased thirst).  Patient reports neuropathy (nerve pain). - Reports feeling like her feet are burning Patient denies visual changes. - Reports sometimes having watery eyes Patient reports self foot exams.   Patient reports blood glucose readings in the 150's-200's  Objective:    Labs:    Lab Results  Component Value Date   HGBA1C 10.6 (A) 12/07/2020   HGBA1C 11.9 (A) 09/01/2020   HGBA1C 11.1 (A) 02/01/2020    Lab Results  Component Value Date   MICRALBCREAT <14 09/01/2020    Lipid Panel     Component Value Date/Time   CHOL 113 12/19/2020 1526    TRIG 102 12/19/2020 1526   HDL 37 (L) 12/19/2020 1526   CHOLHDL 3.1 12/19/2020 1526   CHOLHDL 4.7 05/04/2015 1057   VLDL 47 (H) 05/04/2015 1057   LDLCALC 57 12/19/2020 1526   LDLDIRECT 139 (H) 01/31/2012 1634    Clinical Atherosclerotic Cardiovascular Disease (ASCVD): No  The ASCVD Risk score (Arnett DK, et al., 2019) failed to calculate for the following reasons:   The valid total cholesterol range is 130 to 320 mg/dL    Assessment/Plan:   T2DM is not controlled likely due to continued reluctance with changes in therapy. Reminded patient and son the need to continue to titrate up [name] until she achieves control of blood glucose. Patient hesitant but eventually agreeable to increase Tresiba from 14 units to 20 units once daily. Since patient has tried almost all medications for T2DM with different reported side effects options are limited. Patient previously on Jardiance but had been discontinued due to "feeling less thirsty." Discussed with patient this can be a side effect of improved blood glucose and that I would like to try this medication again. Patient agreeable. Will send in prescription for Jardiance 10mg  once weekly. Patient educated on purpose, proper use and potential adverse effects of Jardiance. Following instruction patient verbalized understanding of treatment plan.  Increased insulin degludec [name]) to 20 units once daily  Continued metformin 1000mg  BID Discontinued GLP1 Rybelsus 7mg  daily Started SGLT2 Jardiance 10mg  once daily Counseled on s/sx of and management of hypoglycemia Sent in new prescription for glucometer and testing supplies Next anticipated A1C due December 2022 with lipid panel  Follow-up appointment  3 weeks to review sugar readings. Written patient instructions provided.  This appointment required 30 minutes of direct patient care.  Thank you for involving pharmacy to assist in providing this patient's care.

## 2021-02-26 NOTE — Assessment & Plan Note (Signed)
T2DM is not controlled likely due to continued reluctance with changes in therapy. Reminded patient and son the need to continue to titrate up [name] until she achieves control of blood glucose. Patient hesitant but eventually agreeable to increase Tresiba from 14 units to 20 units once daily. Since patient has tried almost all medications for T2DM with different reported side effects options are limited. Patient previously on Jardiance but had been discontinued due to "feeling less thirsty." Discussed with patient this can be a side effect of improved blood glucose and that I would like to try this medication again. Patient agreeable. Will send in prescription for Jardiance 10mg  once weekly. Patient educated on purpose, proper use and potential adverse effects of Jardiance. Following instruction patient verbalized understanding of treatment plan.  1. Increased insulin degludec [name]) to 20 units once daily  2. Continued metformin 1000mg  BID 3. Discontinued GLP1 Rybelsus 7mg  daily 4. Started SGLT2 Jardiance 10mg  once daily 5. Counseled on s/sx of and management of hypoglycemia 6. Sent in new prescription for glucometer and testing supplies 7. Next anticipated A1C due December 2022  Follow-up appointment 3 weeks to review sugar readings.

## 2021-02-26 NOTE — Patient Instructions (Addendum)
Miss Blasco it was a pleasure seeing you today.   Please do the following:  Increase your [name] to 20 units once daily as directed today during your appointment. If you have any questions or if you believe something has occurred because of this change, call me or your doctor to let one of [place] know.  Start Farxiga 5mg  once daily Continue checking blood sugars at home. It's really important that you record these and bring these in to your next doctor's appointment.  Continue making the lifestyle changes we've discussed together during our visit. Diet and exercise play a significant role in improving your blood sugars.  Follow-up with me in two weeks.   Hypoglycemia or low blood sugar:   Low blood sugar can happen quickly and may become an emergency if not treated right away.   While this shouldn't happen often, it can be brought upon if you skip a meal or do not eat enough. Also, if your insulin or other diabetes medications are dosed too high, this can cause your blood sugar to go to low.   Warning signs of low blood sugar include: Feeling shaky or dizzy Feeling weak or tired  Excessive hunger Feeling anxious or upset  Sweating even when you aren't exercising  What to do if I experience low blood sugar? Follow the Rule of 15 Check your blood sugar with your meter. If lower than 70, proceed to step 2.  Treat with 15 grams of fast acting carbs which is found in 3-4 glucose tablets. If none are available you can try hard candy, 1 tablespoon of sugar or honey,4 ounces of fruit juice, or 6 ounces of REGULAR soda.  Re-check your sugar in 15 minutes. If it is still below 70, do what you did in step 2 again. If your blood sugar has come back up, go ahead and eat a snack or small meal made up of complex carbs (ex. Whole grains) and protein at this time to avoid recurrence of low blood sugar.

## 2021-03-04 ENCOUNTER — Other Ambulatory Visit: Payer: Self-pay | Admitting: Family Medicine

## 2021-03-06 ENCOUNTER — Ambulatory Visit: Payer: Medicare Other | Admitting: Family Medicine

## 2021-03-06 NOTE — Telephone Encounter (Signed)
Per chart, no longer taking medication. Will not refill oral semaglutide.

## 2021-03-09 ENCOUNTER — Other Ambulatory Visit: Payer: Self-pay

## 2021-03-09 ENCOUNTER — Encounter: Payer: Self-pay | Admitting: Family Medicine

## 2021-03-09 ENCOUNTER — Ambulatory Visit: Payer: Medicare Other | Admitting: Family Medicine

## 2021-03-09 ENCOUNTER — Ambulatory Visit (INDEPENDENT_AMBULATORY_CARE_PROVIDER_SITE_OTHER): Payer: Medicare Other | Admitting: Family Medicine

## 2021-03-09 VITALS — BP 136/83 | HR 93 | Ht 61.0 in | Wt 123.0 lb

## 2021-03-09 DIAGNOSIS — G8929 Other chronic pain: Secondary | ICD-10-CM | POA: Insufficient documentation

## 2021-03-09 DIAGNOSIS — R399 Unspecified symptoms and signs involving the genitourinary system: Secondary | ICD-10-CM | POA: Diagnosis not present

## 2021-03-09 DIAGNOSIS — M545 Low back pain, unspecified: Secondary | ICD-10-CM | POA: Insufficient documentation

## 2021-03-09 DIAGNOSIS — E119 Type 2 diabetes mellitus without complications: Secondary | ICD-10-CM | POA: Diagnosis not present

## 2021-03-09 DIAGNOSIS — R300 Dysuria: Secondary | ICD-10-CM

## 2021-03-09 LAB — POCT URINALYSIS DIP (MANUAL ENTRY)
Bilirubin, UA: NEGATIVE
Blood, UA: NEGATIVE
Glucose, UA: >=1000 mg/dL — AB
Ketones, POC UA: NEGATIVE mg/dL
Leukocytes, UA: NEGATIVE
Nitrite, UA: NEGATIVE
Protein Ur, POC: NEGATIVE mg/dL
Spec Grav, UA: <=1.005 — AB (ref 1.010–1.025)
Urobilinogen, UA: 0.2 E.U./dL
pH, UA: 5.5 (ref 5.0–8.0)

## 2021-03-09 LAB — POCT UA - MICROSCOPIC ONLY

## 2021-03-09 MED ORDER — DICLOFENAC SODIUM 1 % EX GEL: 4.0000 g | Freq: Four times a day (QID) | CUTANEOUS | 0 refills | Status: DC

## 2021-03-09 MED ORDER — ACETAMINOPHEN 500 MG PO TABS: 500.0000 mg | ORAL_TABLET | Freq: Four times a day (QID) | ORAL | 2 refills | Status: AC | PRN

## 2021-03-09 NOTE — Assessment & Plan Note (Signed)
-   On pelvic exam today no signs of prolapse or urethral abnormalities, no lacerations, no vaginal discharge or inflammation or vaginitis or other sign of irritation - Urinalysis showing glucose but without other abnormalities, a few bacteria noted so culture will be sent - Differential diagnosis includes interstitial cystitis versus atrophic vaginitis although the vagina was pink and moist and normal in appearance on my exam - She has a history of cystocele noted in her chart although none appreciated on pelvic exam today, discussed potential referral to to urogynecology as I see she has had many visits for chronic dysuria in the past, she would like to hold at this time but consider at follow-up appointment

## 2021-03-09 NOTE — Progress Notes (Signed)
c   SUBJECTIVE:   CHIEF COMPLAINT / HPI:   Low back pain- starting 2 months ago.  In her bilateral lower back.  Does not radiate anywhere.  No numbness or weakness.  No bowel or bladder incontinence.  No fevers or chills.  No trauma or fall that set it off.  She has never had back pain like this before.  Has not tried any medications for it.  Dysuria-she notes a burning sensation with urination for the past 2 months, she feels like this started before the back pain but is not related to the back pain.  She sometimes feels like she also has to wait a little while for her urine to come.  She urinates 5-6 times a day and drinks some water during the day.  She also notes that her vagina feels "stiff and dry."  She is not sexually active and has not been for the past 25 years.  She denies any vaginal discharge, vaginal bleeding, blood in urine, fevers, chills.  Denies any history of kidney stone.  Denies any abdominal pain.  PERTINENT  PMH / PSH: T2DM  OBJECTIVE:   BP 136/83    Pulse 93    Ht 5\' 1"  (1.549 m)    Wt 123 lb (55.8 kg)    SpO2 100%    BMI 23.24 kg/m   General: A&O, NAD HEENT: No sign of trauma, EOM grossly intact Respiratory: normal WOB GI: Soft, NTTP, non-distended, no CVA tenderness bilaterally, no masses or hepatosplenomegaly appreciated Extremities: Mild tenderness palpation on bilateral SI joints and paraspinal musculature.  No erythema or induration over spine, no tenderness to palpation over lumbar spine. Neuro: Normal gait, moves all four extremities appropriately. Psych: Appropriate mood and affect  GU: Normal appearance of labia majora and minora, without lesions. Vagina tissue pink, moist, without lesions or abrasions. No urethral prolapse. No irrtation of labia or discharge. No lacerations. No cervical prolapse appreciated. No atrophy of vaginal tissue appreciated.   Margette Fast CMA present as chaperone for GU exam.   POCUS bladder scan volume 117 mL, bladder  non-distended, no stone noted  ASSESSMENT/PLAN:   Lower urinary tract symptoms - On pelvic exam today no signs of prolapse or urethral abnormalities, no lacerations, no vaginal discharge or inflammation or vaginitis or other sign of irritation - Urinalysis showing glucose but without other abnormalities, a few bacteria noted so culture will be sent - Differential diagnosis includes interstitial cystitis versus atrophic vaginitis although the vagina was pink and moist and normal in appearance on my exam - She has a history of cystocele noted in her chart although none appreciated on pelvic exam today, discussed potential referral to to urogynecology as I see she has had many visits for chronic dysuria in the past, she would like to hold at this time but consider at follow-up appointment  Chronic bilateral low back pain without sciatica - No red flag signs or symptoms today on exam, has been for the past 2 months, discussed as needed Tylenol and diclofenac gel, medication sent to patient's pharmacy - Consider PT if symptoms present at follow-up  Diabetes [facility]) - Attempted medicine reconciliation today although patient is not sure any of her diabetes meds, notes that her son gives her most of her medications, glucosuria noted in urine today, discussed continuing meds and bring all of these to her follow-up appoint with Dr. Claiborne Billings at next appointment in December     Lenoria Chime, MD [place]

## 2021-03-09 NOTE — Assessment & Plan Note (Signed)
-   No red flag signs or symptoms today on exam, has been for the past 2 months, discussed as needed Tylenol and diclofenac gel, medication sent to patient's pharmacy - Consider PT if symptoms present at follow-up

## 2021-03-09 NOTE — Patient Instructions (Addendum)
It was wonderful to see you today.  Please bring ALL of your medications with you to every visit.   Today we talked about:  - You can try Tylenol and voltaren gel as needed for your back pain - You have follow up in 3 weeks for your diabetes with Dr Georgina Peer - No signs of infection today in your urine or vagina   Thank you for choosing [facility].   Please call [phone] with any questions about today's appointment.  Please be sure to schedule follow up at the front  desk before you leave today.   Yehuda Savannah, MD  Family Medicine

## 2021-03-09 NOTE — Assessment & Plan Note (Signed)
-   Attempted medicine reconciliation today although patient is not sure any of her diabetes meds, notes that her son gives her most of her medications, glucosuria noted in urine today, discussed continuing meds and bring all of these to her follow-up appoint with Dr. Claiborne Billings at next appointment in December

## 2021-03-14 LAB — URINE CULTURE

## 2021-03-15 ENCOUNTER — Ambulatory Visit: Payer: Medicare Other | Admitting: Pharmacist

## 2021-03-27 ENCOUNTER — Other Ambulatory Visit: Payer: Self-pay | Admitting: Family Medicine

## 2021-04-04 ENCOUNTER — Ambulatory Visit (INDEPENDENT_AMBULATORY_CARE_PROVIDER_SITE_OTHER): Payer: Medicare Other | Admitting: Pharmacist

## 2021-04-04 ENCOUNTER — Other Ambulatory Visit: Payer: Self-pay

## 2021-04-04 VITALS — Wt 122.8 lb

## 2021-04-04 DIAGNOSIS — E118 Type 2 diabetes mellitus with unspecified complications: Secondary | ICD-10-CM | POA: Diagnosis present

## 2021-04-04 DIAGNOSIS — E119 Type 2 diabetes mellitus without complications: Secondary | ICD-10-CM

## 2021-04-04 LAB — POCT GLYCOSYLATED HEMOGLOBIN (HGB A1C): HbA1c, POC (controlled diabetic range): 9.4 % — AB (ref 0.0–7.0)

## 2021-04-04 MED ORDER — EMPAGLIFLOZIN 25 MG PO TABS: 25.0000 mg | ORAL_TABLET | Freq: Every day | ORAL | 1 refills | Status: DC

## 2021-04-04 NOTE — Progress Notes (Signed)
° °  Subjective:    Patient ID: Sandra Ryan, female    DOB: 11-Apr-1955, 66 y.o.   MRN: [id number]  HPI Patient is a 66 y.o. female who presents for diabetes management. She is in good spirits and presents without assistance. Patient's son presented with her to appt and translated information to patient. Patient was referred and last seen by Primary Care Provider on 12/19/20. Last seen in pharmacy clinic on 02/26/21  Insurance coverage/medication affordability: Medicare  Current diabetes medications include: insulin degludec [name]) 16 units once daily, metformin 1000mg  twice daily, Jardiance 10mg  once daily Current hyperlipidemia medications include: atorvastatin 80mg  Patient's son states that She is taking her diabetes medications as prescribed. Patient's son reports adherence with medications.    Patient denies hypoglycemic events.  Patient denies polyuria (increased urination).  Patient denies polyphagia (increased appetite). Patient denies polydipsia (increased thirst).  Patient reports neuropathy (nerve pain). - Reports feeling like her feet are burning Patient denies visual changes. - Reports sometimes having watery eyes Patient reports self foot exams.   Objective:   Glucometer readings: Fasting: 175, 206, 202, 152, 292, 115 Random: 398, 424, 212, 259, 247, 276, 220, 256, 346, 260  Labs:    Lab Results  Component Value Date   HGBA1C 9.4 (A) 04/04/2021   HGBA1C 10.6 (A) 12/07/2020   HGBA1C 11.9 (A) 09/01/2020    Lab Results  Component Value Date   MICRALBCREAT <14 09/01/2020    Lipid Panel     Component Value Date/Time   CHOL 113 12/19/2020 1526   TRIG 102 12/19/2020 1526   HDL 37 (L) 12/19/2020 1526   CHOLHDL 3.1 12/19/2020 1526   CHOLHDL 4.7 05/04/2015 1057   VLDL 47 (H) 05/04/2015 1057   LDLCALC 57 12/19/2020 1526   LDLDIRECT 139 (H) 01/31/2012 1634    Clinical Atherosclerotic Cardiovascular Disease (ASCVD): No  The ASCVD Risk score (Arnett DK, et al.,  2019) failed to calculate for the following reasons:   The valid total cholesterol range is 130 to 320 mg/dL    Assessment/Plan:   T2DM is not controlled likely due to continued reluctance with changes in therapy. Reminded patient and son the need to continue to titrate up [name] until she achieves control of blood glucose. Patient hesitant and did not agree to increase insulin today, despite having been told last visit to increase to 20 units and only increasing to 16 units. Patient was agreeable to increasing Jardiance from 10mg  to 25mg  once daily. Since patient has tried almost all medications for T2DM with different reported side effects options are limited therefore discussed likelihood of having to increase [name] again at next visit if still hyperglycemic. Following instruction patient verbalized understanding of treatment plan.  Continued insulin degludec [name]) 16 units once daily  Continued metformin 1000mg  BID Increased SGLT2 Jardiance to 25mg  once daily Counseled on s/sx of and management of hypoglycemia Next anticipated A1C today.  Follow-up appointment 3 weeks to review sugar readings. Written patient instructions provided.  This appointment required 30 minutes of direct patient care.  Thank you for involving pharmacy to assist in providing this patient's care.

## 2021-04-04 NOTE — Patient Instructions (Signed)
Miss Hulet it was a pleasure seeing you today.   Please do the following:  Increase your Jardiance to 25mg  once daily as directed today during your appointment. If you have any questions or if you believe something has occurred because of this change, call me or your doctor to let one of [place] know. Until you pick up the new Jardiance 25mg  you may take two tablets of the 10mg  once daily since you just picked it up from the pharmacy  Continue checking blood sugars at home. It's really important that you record these and bring these in to your next doctor's appointment.  Continue making the lifestyle changes we've discussed together during our visit. Diet and exercise play a significant role in improving your blood sugars.  Follow-up with me in three weeks.    Hypoglycemia or low blood sugar:   Low blood sugar can happen quickly and may become an emergency if not treated right away.   While this shouldn't happen often, it can be brought upon if you skip a meal or do not eat enough. Also, if your insulin or other diabetes medications are dosed too high, this can cause your blood sugar to go to low.   Warning signs of low blood sugar include: Feeling shaky or dizzy Feeling weak or tired  Excessive hunger Feeling anxious or upset  Sweating even when you aren't exercising  What to do if I experience low blood sugar? Follow the Rule of 15 Check your blood sugar with your meter. If lower than 70, proceed to step 2.  Treat with 15 grams of fast acting carbs which is found in 3-4 glucose tablets. If none are available you can try hard candy, 1 tablespoon of sugar or honey,4 ounces of fruit juice, or 6 ounces of REGULAR soda.  Re-check your sugar in 15 minutes. If it is still below 70, do what you did in step 2 again. If your blood sugar has come back up, go ahead and eat a snack or small meal made up of complex carbs (ex. Whole grains) and protein at this time to avoid recurrence of low blood  sugar.

## 2021-04-04 NOTE — Assessment & Plan Note (Signed)
T2DM is not controlled likely due to continued reluctance with changes in therapy. Reminded patient and son the need to continue to titrate up [name] until she achieves control of blood glucose. Patient hesitant and did not agree to increase insulin today, despite having been told last visit to increase to 20 units and only increasing to 16 units. Patient was agreeable to increasing Jardiance from 10mg  to 25mg  once daily. Since patient has tried almost all medications for T2DM with different reported side effects options are limited therefore discussed likelihood of having to increase [name] again at next visit if still hyperglycemic. Following instruction patient verbalized understanding of treatment plan.  1. Continued insulin degludec [name]) 16 units once daily  2. Continued metformin 1000mg  BID 3. Increased SGLT2 Jardiance to 25mg  once daily 4. Counseled on s/sx of and management of hypoglycemia 5. Next anticipated A1C today.  Follow-up appointment 3 weeks to review sugar readings.

## 2021-04-19 NOTE — Progress Notes (Signed)
° ° ° °  SUBJECTIVE:   CHIEF COMPLAINT / HPI:   Sandra Ryan is a 66 y.o. female presents for dysurua  Dysuria Pt reports on going dysuria which she has had for 3 months now. Seen by Dr Thompson Grayer on 03/09/21 who discussed urogynecology referral for chronic dysuria but pt held off at this time. No change in symptoms since then.Does have intermittent back pain with the dysuria. Denies fevers, lower abdominal pain, frequency, urgency or hematuria.  Pt has a hx of grade 3 cystocele.   [place] Office Visit from 04/20/2021 in [place]  PHQ-9 Total Score 8       PERTINENT  PMH / PSH: T2DM  OBJECTIVE:   BP (!) 144/78    Pulse 92    Ht 5\' 1"  (1.549 m)    Wt 121 lb 12.8 oz (55.2 kg)    SpO2 99%    BMI 23.01 kg/m    General: Alert, no acute distress Cardio: well perfused  Pulm: normal work of breathing Abdomen: Bowel sounds normal. Abdomen soft and non-tender.  Neuro: Cranial nerves grossly intact   ASSESSMENT/PLAN:   Lower urinary tract symptoms UA negative for UTI. Shows >1000 glucose due to uncontrolled diabetes. Reassured pt that there is no sign of infection today. Pt would like to referred to urogyn as per previous discussion with Dr Thompson Grayer. Referred to urogyn.    [name], MD PGY-3 [facility]

## 2021-04-20 ENCOUNTER — Ambulatory Visit (INDEPENDENT_AMBULATORY_CARE_PROVIDER_SITE_OTHER): Payer: Medicare Other | Admitting: Family Medicine

## 2021-04-20 ENCOUNTER — Encounter: Payer: Self-pay | Admitting: Family Medicine

## 2021-04-20 ENCOUNTER — Other Ambulatory Visit: Payer: Self-pay

## 2021-04-20 VITALS — BP 144/78 | HR 92 | Ht 61.0 in | Wt 121.8 lb

## 2021-04-20 DIAGNOSIS — R300 Dysuria: Secondary | ICD-10-CM

## 2021-04-20 DIAGNOSIS — R399 Unspecified symptoms and signs involving the genitourinary system: Secondary | ICD-10-CM | POA: Diagnosis not present

## 2021-04-20 LAB — POCT URINALYSIS DIP (MANUAL ENTRY)
Bilirubin, UA: NEGATIVE
Blood, UA: NEGATIVE
Glucose, UA: >=1000 mg/dL — AB
Ketones, POC UA: NEGATIVE mg/dL
Leukocytes, UA: NEGATIVE
Nitrite, UA: NEGATIVE
Protein Ur, POC: NEGATIVE mg/dL
Spec Grav, UA: <=1.005 — AB (ref 1.010–1.025)
Urobilinogen, UA: 0.2 E.U./dL
pH, UA: 5 (ref 5.0–8.0)

## 2021-04-20 LAB — POCT UA - MICROSCOPIC ONLY

## 2021-04-20 NOTE — Patient Instructions (Signed)
°?? ???? ????? ?????????? ???????? ?? ?? ????? ????? ?? ?????? ??????? ????? ????? ??????? ??? ???? ???? ??????? ?   urogynecology ?? ???? ??????? ??????  ????? ???? ?????? ?????? ????? ??????????  ??? ???????? ???? ?????? ?? ????????? ??? ???, ????? [phone] ?? ??????????? ?? ???? ????????????????  ????????,  ?? ????   Thank you for coming to see me today. It was a pleasure. Today we discussed your burning when you pass urine. I recommend referral to urogynecology.  Please follow-up with me as needed.   If you have any questions or concerns, please do not hesitate to call the office at [phone].  Best wishes,   Dr Posey Pronto

## 2021-04-24 NOTE — Assessment & Plan Note (Signed)
UA negative for UTI. Shows >1000 glucose due to uncontrolled diabetes. Reassured pt that there is no sign of infection today. Pt would like to referred to urogyn as per previous discussion with Dr Thompson Grayer. Referred to urogyn.

## 2021-04-26 ENCOUNTER — Ambulatory Visit: Payer: Medicare Other | Admitting: Pharmacist

## 2021-04-28 ENCOUNTER — Other Ambulatory Visit: Payer: Self-pay | Admitting: Family Medicine

## 2021-05-26 ENCOUNTER — Other Ambulatory Visit: Payer: Self-pay | Admitting: Family Medicine

## 2021-05-26 DIAGNOSIS — E785 Hyperlipidemia, unspecified: Secondary | ICD-10-CM

## 2021-05-26 DIAGNOSIS — E1169 Type 2 diabetes mellitus with other specified complication: Secondary | ICD-10-CM

## 2021-06-01 ENCOUNTER — Other Ambulatory Visit: Payer: Self-pay | Admitting: Family Medicine

## 2021-06-01 DIAGNOSIS — K219 Gastro-esophageal reflux disease without esophagitis: Secondary | ICD-10-CM

## 2021-06-01 DIAGNOSIS — E118 Type 2 diabetes mellitus with unspecified complications: Secondary | ICD-10-CM

## 2021-06-29 ENCOUNTER — Ambulatory Visit: Payer: Medicare Other | Admitting: Obstetrics and Gynecology

## 2021-07-13 ENCOUNTER — Ambulatory Visit (INDEPENDENT_AMBULATORY_CARE_PROVIDER_SITE_OTHER): Payer: Medicare Other | Admitting: Obstetrics and Gynecology

## 2021-07-13 ENCOUNTER — Encounter: Payer: Self-pay | Admitting: Obstetrics and Gynecology

## 2021-07-13 VITALS — BP 139/86 | HR 87 | Ht 60.0 in | Wt 117.0 lb

## 2021-07-13 DIAGNOSIS — N952 Postmenopausal atrophic vaginitis: Secondary | ICD-10-CM

## 2021-07-13 DIAGNOSIS — M62838 Other muscle spasm: Secondary | ICD-10-CM

## 2021-07-13 DIAGNOSIS — R350 Frequency of micturition: Secondary | ICD-10-CM | POA: Diagnosis not present

## 2021-07-13 DIAGNOSIS — N393 Stress incontinence (female) (male): Secondary | ICD-10-CM

## 2021-07-13 LAB — POCT URINALYSIS DIPSTICK
Appearance: NORMAL
Bilirubin, UA: NEGATIVE
Blood, UA: NEGATIVE
Glucose, UA: POSITIVE — AB
Ketones, UA: NEGATIVE
Leukocytes, UA: NEGATIVE
Nitrite, UA: NEGATIVE
Protein, UA: NEGATIVE
Spec Grav, UA: 1.015 (ref 1.010–1.025)
Urobilinogen, UA: 0.2 E.U./dL
pH, UA: 6 (ref 5.0–8.0)

## 2021-07-13 MED ORDER — ESTRADIOL 0.1 MG/GM VA CREA: TOPICAL_CREAM | VAGINAL | 11 refills | Status: DC

## 2021-07-13 NOTE — Progress Notes (Signed)
[name] ? ?Referring Provider: Zenia Resides, MD ?PCP: Sandra Haw, MD ?Date of Service: 07/13/2021 ? ?SUBJECTIVE ?Chief Complaint: New Patient (Initial Visit) ? ?History of Present Illness: Sandra Ryan is a 66 y.o. Asian female seen in consultation at the request of Dr. Andria Ryan for evaluation of dysuria.   ? ?Review of records significant for: ?Burning sensation in the urethra. Vagina feels stiff and dry. UA was negative ? ?Urinary Symptoms: ?Leaks urine with cough/ sneeze ?Does not leak every day ?Pad use:  pads  ?She is bothered by her UI symptoms. ? ?Day time voids 6.  Nocturia: 2 times per night to void. ?Voiding dysfunction: she empties her bladder well.  ?does not use a catheter to empty bladder.  ?Feels cramping in the vagina with urination and pain radiates to her back. Feels that she has to return to the bathroom.  ?Has burning with urination.  ?When urinating, she feels difficulty starting urine stream ?Drinks: water, rarely has black tea.   ? ?UTIs:  0  UTI's in the last year.   ?Denies history of blood in urine and kidney or bladder stones ? ?Pelvic Organ Prolapse Symptoms:                  ?Sometimes will feel a bulge in the vagina but not bothersome ? ?Bowel Symptom: ?Bowel movements: 1 time(s) per day ?Stool consistency: soft  ?Straining: yes.  ?Splinting: no.  ?Incomplete evacuation: no.  ?She Admits to accidental bowel leakage / fecal incontinence ? Occurs: rarely ?Bowel regimen: stool softener ? ? ?Sexual Function ?Sexually active: no.  ? ? ?Pelvic Pain ?Admits to pelvic pain- burning on her external vulva and internally, even without urinating ?Has not tried medication.  ?Has been going on for 8-9 months ? ? ?Past Medical History:  ?Past Medical History:  ?Diagnosis Date  ? Abnormal Pap smear   ? Cystocele   ? Diabetes mellitus without complication ([place])   ? GERD (gastroesophageal reflux disease)   ? Hemorrhoid   ? Sleep apnea   ?  Tuberculosis   ? took medication for 4 months  ? ? ? ?Past Surgical History:   ?Past Surgical History:  ?Procedure Laterality Date  ? no signficant history    ? ? ? ?Past OB/GYN History: ?OB History  ?Gravida Para Term Preterm AB Living  ?'9 6 6     6  '$ ?SAB IAB Ectopic Multiple Live Births  ?        9  ?  ?# Outcome Date GA Lbr Len/2nd Weight Sex Delivery Anes PTL Lv  ?9 Gravida           ?8 Gravida           ?7 Gravida           ?6 Term           ?5 Term           ?4 Term           ?3 Term           ?2 Term           ?1 Term           ? ? ?Vaginal deliveries: 6,  Forceps/ Vacuum deliveries: 0, Cesarean section: 0 ?Menopausal: Yes, at age 7, Denies vaginal bleeding since menopause ? ? ? ?Medications: She has a current medication list which includes the following prescription(s): accu-chek softclix lancets, acetaminophen, albuterol,  aspirin, atorvastatin, accu-chek guide, diclofenac sodium, estradiol, accu-chek guide, insulin pen needle, jardiance, accu-chek softclix lancet dev, metformin, naproxen, omeprazole, and tresiba flextouch.  ? ?Allergies: Patient has No Known Allergies.  ? ?Social History:  ?Social History  ? ?Tobacco Use  ? Smoking status: Former  ? Smokeless tobacco: Never  ? Tobacco comments:  ?  occasional use  ?Vaping Use  ? Vaping Use: Never used  ?Substance Use Topics  ? Alcohol use: No  ? Drug use: No  ? ? ?Relationship status: widowed ?She lives with son.   ?She is not employed. ?Regular exercise: No ?History of abuse: No ? ?Family History:   ?Family History  ?Problem Relation Age of Onset  ? Asthma Father   ? Heart disease Father   ? ? ? ?Review of Systems: ROS ? ? ?OBJECTIVE ?Physical Exam: ?Vitals:  ? 07/13/21 1021  ?BP: 139/86  ?Pulse: 87  ?Weight: 117 lb (53.1 kg)  ?Height: 5' (1.524 m)  ? ? ?Physical Exam ? ? ?GU / Detailed Urogynecologic Evaluation:  ?Pelvic Exam: Normal external female genitalia; Bartholin's and Skene's glands normal in appearance; urethral meatus normal in appearance, no  urethral masses or discharge.  ? ?CST: negative ? ?Speculum exam reveals normal vaginal mucosa with atrophy. Cervix normal appearance. Uterus normal single, nontender. Adnexa no mass, fullness, tenderness.   ? ? ?Pelvic floor strength I/V ? ?Pelvic floor musculature: Right levator tender, Right obturator tender, Left levator tender, Left obturator tender ? ?POP-Q:  ? ?POP-Q ? ?0.5  ?                                          Aa   ?0.5 ?                                          Ba  ?-5.5  ?                                            C  ? ?5  ?                                          Gh  ?3.5  ?                                          Pb  ?8  ?                                          tvl  ? ?-2  ?                                          Ap  ?-2  ?  Bp  ?-7  ?                                            D  ? ? ? ?Rectal Exam:  ?Normal external rectum ? ?Post-Void Residual (PVR) by Bladder Scan: ?In order to evaluate bladder emptying, we discussed obtaining a postvoid residual and she agreed to this procedure. ? ?Procedure: The ultrasound unit was placed on the patient's abdomen in the suprapubic region after the patient had voided. A PVR of 20 ml was obtained by bladder scan. ? ?Laboratory Results: ?POC urine: positive glucose, otherwise negative ? ?ASSESSMENT AND PLAN ?Sandra Ryan is a 66 y.o. with:  ?1. Vaginal atrophy   ?2. Levator spasm   ?3. Urinary frequency   ?4. SUI (stress urinary incontinence, female)   ? ?Vaginal atrophy ?- may be contributing to burning sensation. Prescribed estrace 0.5g nightly for two weeks then twice a week after ? ?2. Levator spasm ?- We reviewed the anatomical relationship of the pelvic floor muscles with her exam and symptoms. Suspect that muscle spasm is causing her pelvic pain/ discomfort.  ?- Recommended pelvic floor PT and referral placed.  ? ?3. SUI ?- For treatment of stress urinary incontinence,  non-surgical options include expectant  management, weight loss, physical therapy, as well as a pessary.  Surgical options include a midurethral sling, Burch urethropexy, and transurethral injection of a bulking agent. ?  -She will start with pelvic floor PT ? ?Return 3-4 months for follow up ? ? ?Jaquita Folds, MD ? ? ? ? ?

## 2021-08-05 ENCOUNTER — Other Ambulatory Visit: Payer: Self-pay | Admitting: Family Medicine

## 2021-08-05 DIAGNOSIS — E118 Type 2 diabetes mellitus with unspecified complications: Secondary | ICD-10-CM

## 2021-09-19 ENCOUNTER — Ambulatory Visit (INDEPENDENT_AMBULATORY_CARE_PROVIDER_SITE_OTHER): Payer: Medicare Other | Admitting: Family Medicine

## 2021-09-19 ENCOUNTER — Encounter: Payer: Self-pay | Admitting: Family Medicine

## 2021-09-19 VITALS — BP 122/69 | HR 95 | Wt 119.0 lb

## 2021-09-19 DIAGNOSIS — E119 Type 2 diabetes mellitus without complications: Secondary | ICD-10-CM

## 2021-09-19 DIAGNOSIS — Z0000 Encounter for general adult medical examination without abnormal findings: Secondary | ICD-10-CM

## 2021-09-19 DIAGNOSIS — Z1231 Encounter for screening mammogram for malignant neoplasm of breast: Secondary | ICD-10-CM

## 2021-09-19 DIAGNOSIS — N952 Postmenopausal atrophic vaginitis: Secondary | ICD-10-CM

## 2021-09-19 DIAGNOSIS — Z1211 Encounter for screening for malignant neoplasm of colon: Secondary | ICD-10-CM | POA: Diagnosis not present

## 2021-09-19 DIAGNOSIS — E118 Type 2 diabetes mellitus with unspecified complications: Secondary | ICD-10-CM

## 2021-09-19 LAB — POCT GLYCOSYLATED HEMOGLOBIN (HGB A1C): HbA1c, POC (controlled diabetic range): 9.7 % — AB (ref 0.0–7.0)

## 2021-09-19 NOTE — Patient Instructions (Addendum)
Thank you for coming to see me today. It was a pleasure. Today we discussed your diabetes, it is similar to before. I recommend : Increasing Tresiba to 20  For the burning in the genital area, may continue the cream prescribed by the urogynecologist.  They also recommended physical therapy please arrange an appointment for this.  Please contact the urogynecologist for an earlier visit July.  [facility] at [employer] [facility] 3.5 4 [employer] reviews Urologist in [place], [place] Get online care: [email or web address] Address: [address], [place], [place] [address] Hours:  Open ? Closes 5?PM Phone: [phone]  I have placed an order for your mammogram.  Please call [facility] at [phone] to schedule your appointment within one week.   Please follow-up with me as needed  If you have any questions or concerns, please do not hesitate to call the office at [phone].  Best wishes,   Dr Posey Pronto

## 2021-09-19 NOTE — Assessment & Plan Note (Signed)
A1c 9.7 from 9.4.  Recommended patient increase his [name] to 20 units.  Continue Jardiance 25 mg, metformin 1000 mg twice daily.  Follow-up in 3 months.

## 2021-09-19 NOTE — Progress Notes (Signed)
     SUBJECTIVE:   CHIEF COMPLAINT / HPI:   Sandra Ryan is a 65 y.o. female presents for follow-up  Son present at visit who interpreted for this visit  Vaginal atrophy, levator spasm  Pt reports burning in genital area. She saw urogyn on 07/13/2021. She used the estrace cream for a few weeks but it was not effective. She was offer pelvic floor PT and referral was place however she did not go.  Diabetes Patient's current diabetic medications include Tresiba 16 units daily, metformin '1000mg'$  BID, jardiance '25mg'$ . Tolerating well without side effects.  Patient endorses compliance with these medications. CBG readings averaging in the 150-300 range.  Patient's last A1c was  Lab Results  Component Value Date   HGBA1C 9.7 (A) 09/19/2021   HGBA1C 9.4 (A) 04/04/2021   HGBA1C 10.6 (A) 12/07/2020   Denies abdominal pain, blurred vision, polyuria, polydipsia, hypoglycemia. Patient states they understand that diet and exercise can help with her diabetes.    Last Microalbumin, LDL, Creatinine: Lab Results  Component Value Date   MICROALBUR 10 01/31/2012   LDLCALC 57 12/19/2020   CREATININE 0.68 12/19/2020    Flowsheet Row Office Visit from 04/20/2021 in [place]  PHQ-9 Total Score 90        PERTINENT  PMH / PSH: Diabetes, hypertension, GERD  OBJECTIVE:   BP 122/69   Pulse 95   Wt 119 lb (54 kg)   BMI 23.24 kg/m    General: Alert, no acute distress, pleasant Cardio: Well-perfused Pulm: normal work of breathing Neuro: Cranial nerves grossly intact   ASSESSMENT/PLAN:   Diabetes (HCC) A1c 9.7 from 9.4.  Recommended patient increase his [name] to 20 units.  Continue Jardiance 25 mg, metformin 1000 mg twice daily.  Follow-up in 3 months.  Vaginal atrophy Ongoing symptoms of dysuria and lower pelvic spasms.  Patient is stopped using Estrace cream.  Recommend follow-up with urogyn and to start pelvic floor therapy.  Son will contact them to arrange an  appointment for both.  Healthcare maintenance Ordered mammogram and placed referral for colonoscopy.    [name], MD PGY-3 [facility]

## 2021-09-19 NOTE — Assessment & Plan Note (Signed)
Ongoing symptoms of dysuria and lower pelvic spasms.  Patient is stopped using Estrace cream.  Recommend follow-up with urogyn and to start pelvic floor therapy.  Son will contact them to arrange an appointment for both.

## 2021-09-19 NOTE — Assessment & Plan Note (Signed)
Ordered mammogram and placed referral for colonoscopy.

## 2021-10-02 ENCOUNTER — Other Ambulatory Visit: Payer: Self-pay | Admitting: Family Medicine

## 2021-10-02 DIAGNOSIS — K219 Gastro-esophageal reflux disease without esophagitis: Secondary | ICD-10-CM

## 2021-10-02 DIAGNOSIS — E118 Type 2 diabetes mellitus with unspecified complications: Secondary | ICD-10-CM

## 2021-11-13 ENCOUNTER — Ambulatory Visit (INDEPENDENT_AMBULATORY_CARE_PROVIDER_SITE_OTHER): Payer: Medicare Other | Admitting: Obstetrics and Gynecology

## 2021-11-13 ENCOUNTER — Encounter: Payer: Self-pay | Admitting: Obstetrics and Gynecology

## 2021-11-13 VITALS — BP 136/79 | HR 93

## 2021-11-13 DIAGNOSIS — M62838 Other muscle spasm: Secondary | ICD-10-CM

## 2021-11-13 DIAGNOSIS — N393 Stress incontinence (female) (male): Secondary | ICD-10-CM | POA: Diagnosis not present

## 2021-11-13 NOTE — Progress Notes (Unsigned)
[facility] Return Visit  SUBJECTIVE  History of Present Illness: Sandra Ryan is a 66 y.o. female seen in follow-up for vaginal atrophy, levator spasm and stress incontinence. Plan at last visit was to start pelvic physical therapy and vaginal atrophy.   She is using the cream as needed but not regularly. Has some vaginal burning at other times.   Pt had the wrong phone number for the physical therapy.   Past Medical History: Patient  has a past medical history of Abnormal Pap smear, Cystocele, Diabetes mellitus without complication ([place]), GERD (gastroesophageal reflux disease), Hemorrhoid, Sleep apnea, and Tuberculosis.   Past Surgical History: She  has a past surgical history that includes no signficant history.   Medications: She has a current medication list which includes the following prescription(s): accu-chek softclix lancets, acetaminophen, albuterol, aspirin ec, atorvastatin, accu-chek guide, diclofenac sodium, estradiol, accu-chek guide, insulin pen needle, jardiance, accu-chek softclix lancet dev, metformin, naproxen, omeprazole, and tresiba flextouch.   Allergies: Patient has No Known Allergies.   Social History: Patient  reports that she has quit smoking. She has never used smokeless tobacco. She reports that she does not drink alcohol and does not use drugs.      OBJECTIVE     Physical Exam: There were no vitals filed for this visit. Gen: No apparent distress, A&O x 3.  Detailed Urogynecologic Evaluation:  Deferred. Prior exam showed:      No data to display             ASSESSMENT AND PLAN    Ms. Sandra Ryan is a 66 y.o. with:  No diagnosis found.  - Use estrogen cream twice a week. Can also use coconut oil for vaginal moisture as needed.

## 2021-11-13 NOTE — Patient Instructions (Signed)
-   Use estrogen cream twice a week. Can use coconut oil in the vagina for moisture other days.

## 2021-11-14 ENCOUNTER — Encounter: Payer: Self-pay | Admitting: Obstetrics and Gynecology

## 2021-11-19 ENCOUNTER — Ambulatory Visit
Admission: RE | Admit: 2021-11-19 | Discharge: 2021-11-19 | Disposition: A | Discharge status: Home / Self Care | Payer: Medicare Other | Source: Ambulatory Visit | Attending: Family Medicine | Admitting: Family Medicine

## 2021-11-19 DIAGNOSIS — Z1231 Encounter for screening mammogram for malignant neoplasm of breast: Secondary | ICD-10-CM

## 2021-11-28 NOTE — Therapy (Signed)
OUTPATIENT PHYSICAL THERAPY FEMALE PELVIC EVALUATION   Patient Name: Sandra Ryan MRN: [id number] DOB:Jun 17, 1955, 66 y.o., female Today's Date: 11/29/2021   PT End of Session - 11/29/21 1516     Visit Number 1    Date for PT Re-Evaluation 02/21/22    Authorization Type Medicare/medicaid    PT Start Time 1300    PT Stop Time 1345    PT Time Calculation (min) 45 min    Activity Tolerance Patient tolerated treatment well;Other (comment)   language barrier   Behavior During Therapy WFL for tasks assessed/performed             Past Medical History:  Diagnosis Date   Abnormal Pap smear    Cystocele    Diabetes mellitus without complication (HCC)    GERD (gastroesophageal reflux disease)    Hemorrhoid    Sleep apnea    Tuberculosis    took medication for 4 months   Past Surgical History:  Procedure Laterality Date   no signficant history     Patient Active Problem List   Diagnosis Date Noted   Vaginal atrophy 09/19/2021   Knee pain, bilateral 12/20/2020   Sessile colonic polyp 07/08/2019   Diabetes ([place]) 12/25/2016   Hypercholesteremia 09/24/2016   Depression 03/12/2016   Osteoarthritis 05/04/2015   Healthcare maintenance 12/22/2013   Peripheral neuropathy 12/22/2013   Diabetic retinopathy, mild, bilateral 11/11/2012   High blood pressure associated with diabetes ([place]) 05/24/2012   ASCUS (atypical squamous cells of undetermined significance) on Pap smear 11/28/2011    PCP: Ezequiel Essex, MD  REFERRING PROVIDER: Jaquita Folds, MD  REFERRING DIAG:  N39.3 (ICD-10-CM) - SUI (stress urinary incontinence, female)  [phone] (ICD-10-CM) - Levator spasm    THERAPY DIAG:  Cramp and spasm  Pelvic pain  Rationale for Evaluation and Treatment Rehabilitation  ONSET DATE: 05/2021  SUBJECTIVE:                                                                                                                                                                                            SUBJECTIVE STATEMENT: Leaking urine. Interpreter present during the eval.  Fluid intake: Yes: water     PAIN:  Are you having pain? Yes NPRS scale: 9/10 Pain location:  lower abdominal  and lower back  Pain type: tightness in back and lower abdomen; burning in vagina Pain description: intermittent   Aggravating factors: housework,  Relieving factors: laying down  PRECAUTIONS: None  WEIGHT BEARING RESTRICTIONS No  FALLS:  Has patient fallen in last 6 months? No  LIVING ENVIRONMENT: Lives with: lives with their son  OCCUPATION: none  PLOF:  Independent  PATIENT GOALS reduce pain and leakage  PERTINENT HISTORY:  Cystocele; diabetes  URINATION Pain with urination: Yes, burning with urination Fully empty bladder: Feels cramping in the vagina with urination and pain radiates to her back. Feels that she has to return to the bathroom. When urinating, she feels difficulty starting urine stream Stream: Strong Urgency: Yes: has to go as soon as possible Frequency: daytime voids 6 and 2 times per night Leakage: Coughing and Sneezing Pads: No  INTERCOURSE; not sexually active  PREGNANCY Vaginal deliveries 6    OBJECTIVE:   DIAGNOSTIC FINDINGS:  Doctors findings: Pelvic floor strength I/V  Pelvic floor musculature: Right levator tender, Right obturator tender, Left levator tender, Left obturator tender PVR of 20 ml was obtained by bladder scan.  PATIENT SURVEYS:  Difficult to do due to language barrier  COGNITION:  Overall cognitive status: Within functional limits for tasks assessed                    POSTURE: No Significant postural limitations   PELVIC ALIGNMENT:  LUMBARAROM/PROM full lumbar ROM   LOWER EXTREMITY ROM: Bilateral hip ROM is full   LOWER EXTREMITY MMT: bilateral hip strength is 5/5    PALPATION:   General  tenderness located outside her clothes along the ischiocavernosus and bulbocavernosus and vulvar area                  Patient confirms identification and approves PT to assess internal pelvic floor and treatment No  PELVIC MMT:   MMT eval  Vaginal   Internal Anal Sphincter   External Anal Sphincter   Puborectalis   Diastasis Recti   (Blank rows = not tested)        TONE: Unable to assess tone and strength due to patient not wanting an internal assessment   TODAY'S TREATMENT  EVAL Date: 11/29/2021 HEP established-see below    PATIENT EDUCATION:  Education details: educated patient on using Good Clean love as a moisturizer to try to reduce her vaginal burning  Person educated: Patient and interpreter Education method: Explanation and Demonstration Education comprehension: verbalized understanding and needs further education   HOME EXERCISE PROGRAM: See above.   ASSESSMENT:  CLINICAL IMPRESSION: Patient is a 66 y.o. female who was seen today for physical therapy evaluation and treatment for SUI and levator spasm. Patient reports her lower abdominal, back and vaginal pain is constant  at 9/10. Patient pain increases with housework and better with laying down. She will have burning pain when urinating. Patient has difficulty starting a urine stream. She will leak urine with coughing and sneezing. She did not agree with therapist doing a examination internally to assess the muscles. MD found the following on her exam: pelvic floor strength 1/5 and tenderness located on bilateral levator ani and obturator internist. She had tenderness located outside her clothes on the ischiocavernosus and bulbocavernosus. Patient had an interpreter present during the evaluation but still had a language barrier present. Patient would benefit from skilled therapy to reduce her pain.    OBJECTIVE IMPAIRMENTS decreased activity tolerance, increased fascial restrictions, increased muscle spasms, and pain.   ACTIVITY LIMITATIONS carrying, lifting, transfers, continence, and caring for others  PARTICIPATION  LIMITATIONS: meal prep, cleaning, and laundry  PERSONAL FACTORS Age, Education, Fitness, and finances  are also affecting patient's functional outcome.   REHAB POTENTIAL: Good  CLINICAL DECISION MAKING: Stable/uncomplicated  EVALUATION COMPLEXITY: Low   GOALS: Goals reviewed with patient? Yes  SHORT TERM GOALS:  Target date: 12/27/2021  Patient understands what vaginal moisturizers are and how they can help with the burning.  Baseline: Goal status: INITIAL   LONG TERM GOALS: Target date: 02/21/2022   Patient understands with an interpreter on how to stretch her hips and pelvic floor muscles to reduce muscle spasms.  Baseline:  Goal status: INITIAL  2.  Patient understands how to massage her pelvic floor externally to reduce the muscle spasms and reduce her pain.  Baseline:  Goal status: INITIAL  3.  Patient reports her pain level in the vaginal area and lower abdomen decreased </= 5/10.  Baseline:  Goal status: INITIAL  4.  Patient reports her burning during urination decreased >/= 5/10.  Baseline:  Goal status: INITIAL   PLAN: PT FREQUENCY: 1x/week  PT DURATION: 12 weeks  PLANNED INTERVENTIONS: Therapeutic exercises, Therapeutic activity, Neuromuscular re-education, Patient/Family education, Self Care, and Manual therapy  PLAN FOR NEXT SESSION: see if the moisturizers helped, diaphragmatic breathing, hip stretches   Earlie Counts, PT 11/29/21 3:33 PM

## 2021-11-29 ENCOUNTER — Encounter: Payer: Medicare Other | Attending: Obstetrics and Gynecology | Admitting: Physical Therapy

## 2021-11-29 ENCOUNTER — Other Ambulatory Visit: Payer: Self-pay

## 2021-11-29 ENCOUNTER — Encounter: Payer: Self-pay | Admitting: Physical Therapy

## 2021-11-29 DIAGNOSIS — M62838 Other muscle spasm: Secondary | ICD-10-CM | POA: Insufficient documentation

## 2021-11-29 DIAGNOSIS — Z5189 Encounter for other specified aftercare: Secondary | ICD-10-CM | POA: Diagnosis not present

## 2021-11-29 DIAGNOSIS — N393 Stress incontinence (female) (male): Secondary | ICD-10-CM | POA: Diagnosis not present

## 2021-11-29 DIAGNOSIS — R102 Pelvic and perineal pain: Secondary | ICD-10-CM

## 2021-11-29 DIAGNOSIS — R252 Cramp and spasm: Secondary | ICD-10-CM

## 2021-12-02 ENCOUNTER — Other Ambulatory Visit: Payer: Self-pay | Admitting: Family Medicine

## 2021-12-02 DIAGNOSIS — E118 Type 2 diabetes mellitus with unspecified complications: Secondary | ICD-10-CM

## 2021-12-06 ENCOUNTER — Encounter: Payer: Self-pay | Admitting: Physical Therapy

## 2021-12-06 ENCOUNTER — Encounter: Payer: Medicare Other | Admitting: Physical Therapy

## 2021-12-06 DIAGNOSIS — M62838 Other muscle spasm: Secondary | ICD-10-CM | POA: Diagnosis not present

## 2021-12-06 DIAGNOSIS — R252 Cramp and spasm: Secondary | ICD-10-CM

## 2021-12-06 DIAGNOSIS — R102 Pelvic and perineal pain: Secondary | ICD-10-CM

## 2021-12-06 NOTE — Therapy (Signed)
OUTPATIENT PHYSICAL THERAPY TREATMENT NOTE   Patient Name: Sandra Ryan MRN: [id number] DOB:[birth year], 66 y.o., female Today's Date: 12/06/2021  PCP: Ezequiel Essex, MD   REFERRING PROVIDER: Jaquita Folds, MD  END OF SESSION:   PT End of Session - 12/06/21 1307     Visit Number 2    Date for PT Re-Evaluation 02/21/22    Authorization Type Medicare/medicaid    Authorization - Visit Number 2    Authorization - Number of Visits 10    PT Start Time 1300    PT Stop Time 2992    PT Time Calculation (min) 45 min    Activity Tolerance Patient tolerated treatment well;Other (comment)   interpreter   Behavior During Therapy WFL for tasks assessed/performed             Past Medical History:  Diagnosis Date   Abnormal Pap smear    Cystocele    Diabetes mellitus without complication (HCC)    GERD (gastroesophageal reflux disease)    Hemorrhoid    Sleep apnea    Tuberculosis    took medication for 4 months   Past Surgical History:  Procedure Laterality Date   no signficant history     Patient Active Problem List   Diagnosis Date Noted   Vaginal atrophy 09/19/2021   Knee pain, bilateral 12/20/2020   Sessile colonic polyp 07/08/2019   Diabetes ([place]) 12/25/2016   Hypercholesteremia 09/24/2016   Depression 03/12/2016   Osteoarthritis 05/04/2015   Healthcare maintenance 12/22/2013   Peripheral neuropathy 12/22/2013   Diabetic retinopathy, mild, bilateral 11/11/2012   High blood pressure associated with diabetes ([place]) 05/24/2012   ASCUS (atypical squamous cells of undetermined significance) on Pap smear 11/28/2011   REFERRING DIAG:  N39.3 (ICD-10-CM) - SUI (stress urinary incontinence, female)  M62.838 (ICD-10-CM) - Levator spasm      THERAPY DIAG:  Cramp and spasm   Pelvic pain   Rationale for Evaluation and Treatment Rehabilitation   ONSET DATE: 05/2021   SUBJECTIVE:                                                                                                                                                                                             SUBJECTIVE STATEMENT: Leaking urine. Interpreter present. The moisturizer did not help and she continues to have burning. Slow urine stream and takes long to get out.  Fluid intake: Yes: water       PAIN:  Are you having pain? Yes NPRS scale: 9/10 Pain location:  lower abdominal  and lower back   Pain type: tightness in back and lower abdomen; burning in vagina  Pain description: intermittent    Aggravating factors: housework,  Relieving factors: laying down   PRECAUTIONS: None   WEIGHT BEARING RESTRICTIONS No   FALLS:  Has patient fallen in last 6 months? No   LIVING ENVIRONMENT: Lives with: lives with their son   OCCUPATION: none   PLOF: Independent   PATIENT GOALS reduce pain and leakage   PERTINENT HISTORY:  Cystocele; diabetes   URINATION Pain with urination: Yes, burning with urination Fully empty bladder: Feels cramping in the vagina with urination and pain radiates to her back. Feels that she has to return to the bathroom. When urinating, she feels difficulty starting urine stream Stream: Strong Urgency: Yes: has to go as soon as possible Frequency: daytime voids 6 and 2 times per night Leakage: Coughing and Sneezing Pads: No   INTERCOURSE; not sexually active   PREGNANCY Vaginal deliveries 6       OBJECTIVE:    DIAGNOSTIC FINDINGS:  Doctors findings: Pelvic floor strength I/V  Pelvic floor musculature: Right levator tender, Right obturator tender, Left levator tender, Left obturator tender PVR of 20 ml was obtained by bladder scan.   PATIENT SURVEYS:  Difficult to do due to language barrier   COGNITION:            Overall cognitive status: Within functional limits for tasks assessed                                           POSTURE: No Significant postural limitations               PELVIC ALIGNMENT:   LUMBARAROM/PROM full lumbar ROM      LOWER EXTREMITY ROM: Bilateral hip ROM is full     LOWER EXTREMITY MMT: bilateral hip strength is 5/5      PALPATION:   General  tenderness located outside her clothes along the ischiocavernosus and bulbocavernosus and vulvar area                  Patient confirms identification and approves PT to assess internal pelvic floor and treatment No   PELVIC MMT:   MMT eval  Vaginal    Internal Anal Sphincter    External Anal Sphincter    Puborectalis    Diastasis Recti    (Blank rows = not tested)         TONE: Unable to assess tone and strength due to patient not wanting an internal assessment     TODAY'S TREATMENT  12/06/2021 Manual: Soft tissue mobilization:educated patient on how to perform manual work to the perineal area, along the vulvar, along the perineal body, along the ischiocavernosus with the interpreter and female model used for demonstration. Therapist also demonstrated on patient through her clothes.  Exercises: Stretches/mobility:sitting piriformis stretch holding for 30 sec. Bil. Happy baby sitting Supine hamstring stretch holding for 30 sec bil.  Supine trunk rotation holding for 30 sec bil.  Self-care: Educated patient on how tight muscles and dry tissue can cause buring in vaginal area with urination and slower stream with interpreter explaining       PATIENT EDUCATION: 12/06/2021 Education details: Access Code: [email or web address] Person educated: Patient and interpreter Education method: Explanation, Demonstration, Tactile cues, Verbal cues, and Handouts Education comprehension: verbalized understanding, returned demonstration, verbal cues required, tactile cues required, and needs further education      HOME EXERCISE PROGRAM: 12/06/2021 Access Code:  [email or web address] URL: [email or web address] Date: 12/06/2021 Prepared by: Earlie Counts  Exercises - Seated Piriformis Stretch with Trunk Bend  - 1 x daily - 7 x weekly - 1 sets - 2 reps - 30 sec hold -  Seated Happy Baby With Trunk Flexion For Pelvic Relaxation  - 1 x daily - 7 x weekly - 1 sets - 2 reps - 30 sec hold - Supine Hamstring Stretch  - 1 x daily - 7 x weekly - 1 sets - 2 reps - 330 sec hold - Supine Piriformis Stretch with Leg Straight  - 1 x daily - 7 x weekly - 1 sets - 2 reps - 30 sec hold    ASSESSMENT:   CLINICAL IMPRESSION: Patient is a 66 y.o. female who was seen today for physical therapy  treatment for SUI and levator spasm. Patient was educated on how to massage her pelvic floor muscles. She reported the burning went away while she was massaging the area. Therapist and interpreter went over the female anatomy and why tight muscles could cause burning so she understands. Patient was able to do her stretches correctly. She is not using the vaginal moisturizer due to not changing her burning. Patient had an interpreter present during the treatment but still had a language barrier present. Patient would benefit from skilled therapy to reduce her pain.      OBJECTIVE IMPAIRMENTS decreased activity tolerance, increased fascial restrictions, increased muscle spasms, and pain.    ACTIVITY LIMITATIONS carrying, lifting, transfers, continence, and caring for others   PARTICIPATION LIMITATIONS: meal prep, cleaning, and laundry   PERSONAL FACTORS Age, Education, Fitness, and finances  are also affecting patient's functional outcome.    REHAB POTENTIAL: Good   CLINICAL DECISION MAKING: Stable/uncomplicated   EVALUATION COMPLEXITY: Low     GOALS: Goals reviewed with patient? Yes   SHORT TERM GOALS: Target date: 12/27/2021   Patient understands what vaginal moisturizers are and how they can help with the burning.  Baseline: Goal status: Met 12/06/2021     LONG TERM GOALS: Target date: 02/21/2022    Patient understands with an interpreter on how to stretch her hips and pelvic floor muscles to reduce muscle spasms.  Baseline:  Goal status: INITIAL   2.  Patient  understands how to massage her pelvic floor externally to reduce the muscle spasms and reduce her pain.  Baseline:  Goal status: INITIAL   3.  Patient reports her pain level in the vaginal area and lower abdomen decreased </= 5/10.  Baseline:  Goal status: INITIAL   4.  Patient reports her burning during urination decreased >/= 5/10.  Baseline:  Goal status: INITIAL     PLAN: PT FREQUENCY: 1x/week   PT DURATION: 12 weeks   PLANNED INTERVENTIONS: Therapeutic exercises, Therapeutic activity, Neuromuscular re-education, Patient/Family education, Self Care, and Manual therapy   PLAN FOR NEXT SESSION: diaphragmatic breathing, review hip stretches and vaginal massage   Earlie Counts, PT 12/06/21 1:49 PM

## 2021-12-11 ENCOUNTER — Encounter: Payer: Self-pay | Admitting: Physical Therapy

## 2021-12-11 ENCOUNTER — Encounter: Payer: Medicare Other | Admitting: Physical Therapy

## 2021-12-11 DIAGNOSIS — R102 Pelvic and perineal pain: Secondary | ICD-10-CM

## 2021-12-11 DIAGNOSIS — M62838 Other muscle spasm: Secondary | ICD-10-CM | POA: Diagnosis not present

## 2021-12-11 DIAGNOSIS — R252 Cramp and spasm: Secondary | ICD-10-CM

## 2021-12-11 NOTE — Therapy (Addendum)
OUTPATIENT PHYSICAL THERAPY TREATMENT NOTE   Patient Name: Sandra Ryan MRN: [id number] DOB:[birth year], 66 y.o., female Today's Date: 12/11/2021  PCP: Ezequiel Essex, MD REFERRING PROVIDER: Jaquita Folds, MD    END OF SESSION:   PT End of Session - 12/11/21 1305     Visit Number 3    Date for PT Re-Evaluation 02/21/22    Authorization Type Medicare/medicaid    Authorization - Visit Number 3    Authorization - Number of Visits 10    PT Start Time 1300    PT Stop Time 5462    PT Time Calculation (min) 45 min    Activity Tolerance Patient tolerated treatment well;Other (comment)   interpreter   Behavior During Therapy WFL for tasks assessed/performed             Past Medical History:  Diagnosis Date   Abnormal Pap smear    Cystocele    Diabetes mellitus without complication (HCC)    GERD (gastroesophageal reflux disease)    Hemorrhoid    Sleep apnea    Tuberculosis    took medication for 4 months   Past Surgical History:  Procedure Laterality Date   no signficant history     Patient Active Problem List   Diagnosis Date Noted   Vaginal atrophy 09/19/2021   Knee pain, bilateral 12/20/2020   Sessile colonic polyp 07/08/2019   Diabetes ([place]) 12/25/2016   Hypercholesteremia 09/24/2016   Depression 03/12/2016   Osteoarthritis 05/04/2015   Healthcare maintenance 12/22/2013   Peripheral neuropathy 12/22/2013   Diabetic retinopathy, mild, bilateral 11/11/2012   High blood pressure associated with diabetes ([place]) 05/24/2012   ASCUS (atypical squamous cells of undetermined significance) on Pap smear 11/28/2011  REFERRING DIAG:  N39.3 (ICD-10-CM) - SUI (stress urinary incontinence, female)  M62.838 (ICD-10-CM) - Levator spasm      THERAPY DIAG:  Cramp and spasm   Pelvic pain   Rationale for Evaluation and Treatment Rehabilitation   ONSET DATE: 05/2021   SUBJECTIVE:                                                                                                                                                                                             SUBJECTIVE STATEMENT: Interpreter present. The burning pain is better. The moisturizer cream is helping with the burning and the massage she does to the vagina help with the tight muscles. Patient reports she does not have the burning with the urination daily anymore. It happens  2 days out of the week.    PAIN:  Are you having pain? Yes NPRS scale: 6/10 Pain location:  lower abdominal  and lower back   Pain type: tightness in back and lower abdomen; burning in vagina Pain description: intermittent    Aggravating factors: housework,  Relieving factors: laying down   PRECAUTIONS: None   WEIGHT BEARING RESTRICTIONS No   FALLS:  Has patient fallen in last 6 months? No   LIVING ENVIRONMENT: Lives with: lives with their son   OCCUPATION: none   PLOF: Independent   PATIENT GOALS reduce pain and leakage   PERTINENT HISTORY:  Cystocele; diabetes   URINATION Pain with urination: Yes, burning with urination Fully empty bladder: Feels cramping in the vagina with urination and pain radiates to her back. Feels that she has to return to the bathroom. When urinating, she feels difficulty starting urine stream Stream: Strong Urgency: Yes: has to go as soon as possible Frequency: daytime voids 6 and 2 times per night Leakage: Coughing and Sneezing Pads: No   INTERCOURSE; not sexually active   PREGNANCY Vaginal deliveries 6       OBJECTIVE:    DIAGNOSTIC FINDINGS:  Doctors findings: Pelvic floor strength I/V  Pelvic floor musculature: Right levator tender, Right obturator tender, Left levator tender, Left obturator tender PVR of 20 ml was obtained by bladder scan.   PATIENT SURVEYS:  Difficult to do due to language barrier   COGNITION:            Overall cognitive status: Within functional limits for tasks assessed                                           POSTURE: No  Significant postural limitations               PELVIC ALIGNMENT:   LUMBARAROM/PROM full lumbar ROM     LOWER EXTREMITY ROM: Bilateral hip ROM is full     LOWER EXTREMITY MMT: bilateral hip strength is 5/5      PALPATION:   General  tenderness located outside her clothes along the ischiocavernosus and bulbocavernosus and vulvar area                  Patient confirms identification and approves PT to assess internal pelvic floor and treatment No   PELVIC MMT:   MMT eval  Vaginal    Internal Anal Sphincter    External Anal Sphincter    Puborectalis    Diastasis Recti    (Blank rows = not tested)         TONE: Unable to assess tone and strength due to patient not wanting an internal assessment     TODAY'S TREATMENT  12/11/2021 Manual: Soft tissue mobilization:educated patient on the pelvic floor model on how to massage the pelvic floor muscles, when the muscles cause burning due to tightness, and how to massage to increase blood flow to reduce dryness .Went over this with the interpreter to explain to the patient.  Exercises: Stretches/mobility:sitting piriformis stretch holding for 30 sec. Bil. Happy baby sitting Supine hamstring stretch holding for 30 sec bil.  Supine trunk rotation holding for 30 sec bil.  Butterfly stretch in supine holding for 1 min.   Self-care: Reviewed on how to use the vaginal moisturizer on the vagina daily and she understands    12/06/2021 Manual: Soft tissue mobilization:educated patient on how to perform manual work to the perineal area, along the vulvar, along the perineal body, along the ischiocavernosus with the interpreter  and female model used for demonstration. Therapist also demonstrated on patient through her clothes.  Exercises: Stretches/mobility:sitting piriformis stretch holding for 30 sec. Bil. Happy baby sitting Supine hamstring stretch holding for 30 sec bil.  Supine trunk rotation holding for 30 sec bil.   Self-care: Educated patient on how tight muscles and dry tissue can cause buring in vaginal area with urination and slower stream with interpreter explaining       PATIENT EDUCATION: 12/11/2021 Education details: Access Code: [email or web address] Person educated: Patient and interpreter Education method: Explanation, Demonstration, Tactile cues, Verbal cues, and Handouts Education comprehension: verbalized understanding, returned demonstration, verbal cues required, tactile cues required, and needs further education       HOME EXERCISE PROGRAM: 12/11/2021 Access Code: [email or web address] URL: [email or web address] Date: 12/11/2021 Prepared by: Earlie Counts  Exercises - Seated Piriformis Stretch with Trunk Bend  - 1 x daily - 7 x weekly - 1 sets - 2 reps - 30 sec hold - Seated Happy Baby With Trunk Flexion For Pelvic Relaxation  - 1 x daily - 7 x weekly - 1 sets - 2 reps - 30 sec hold - Supine Hamstring Stretch  - 1 x daily - 7 x weekly - 1 sets - 2 reps - 330 sec hold - Supine Piriformis Stretch with Leg Straight  - 1 x daily - 7 x weekly - 1 sets - 2 reps - 30 sec hold - Supine Butterfly Groin Stretch  - 1 x daily - 7 x weekly - 1 sets - 1 reps - 1 min hold     ASSESSMENT:   CLINICAL IMPRESSION: Patient is a 66 y.o. female who was seen today for physical therapy  treatment for SUI and levator spasm. Patient was educated on how to massage her pelvic floor muscles. She reported the burning happens 2 days out of the week instead of daily. Patient reported the Good Clean Love vaginal moisturizer is helping with the burning so therapist gave her another sample and where to purchase. . Therapist and interpreter went over the female anatomy and why tight muscles could cause burning so she understands. Patient was able to do her stretches correctly. Patient had an interpreter present during the treatment but still had a language barrier present. Patient reports her burning has reduced from a 9/10 to a  6/10. Patient would benefit from skilled therapy to reduce her pain.      OBJECTIVE IMPAIRMENTS decreased activity tolerance, increased fascial restrictions, increased muscle spasms, and pain.    ACTIVITY LIMITATIONS carrying, lifting, transfers, continence, and caring for others   PARTICIPATION LIMITATIONS: meal prep, cleaning, and laundry   PERSONAL FACTORS Age, Education, Fitness, and finances  are also affecting patient's functional outcome.    REHAB POTENTIAL: Good   CLINICAL DECISION MAKING: Stable/uncomplicated   EVALUATION COMPLEXITY: Low     GOALS: Goals reviewed with patient? Yes   SHORT TERM GOALS: Target date: 12/27/2021   Patient understands what vaginal moisturizers are and how they can help with the burning.  Baseline: Goal status: Met 12/06/2021     LONG TERM GOALS: Target date: 02/21/2022    Patient understands with an interpreter on how to stretch her hips and pelvic floor muscles to reduce muscle spasms.  Baseline:  Goal status: Met 12/11/2021  2.  Patient understands how to massage her pelvic floor externally to reduce the muscle spasms and reduce her pain.  Baseline:  Goal status: Met 12/11/2021  3.  Patient reports her pain level in the vaginal area  and lower abdomen decreased </= 5/10.  Baseline:  Goal status: ongoing 12/11/2021  4.  Patient reports her burning during urination decreased >/= 5/10.  Baseline:  Goal status: ongoing 12/11/2021    PLAN: PT FREQUENCY: 1x/week   PT DURATION: 12 weeks   PLANNED INTERVENTIONS: Therapeutic exercises, Therapeutic activity, Neuromuscular re-education, Patient/Family education, Self Care, and Manual therapy   PLAN FOR NEXT SESSION: Patient is going to see when her son is able to bring her back to therapy. She would like to continue due to it helping. She will call back to schedule. diaphragmatic breathing, review hip stretches and vaginal massage    Earlie Counts, PT 12/11/21 1:50 PM   PHYSICAL THERAPY  DISCHARGE SUMMARY  Visits from Start of Care: 3  Current functional level related to goals / functional outcomes: See above   Remaining deficits: See above.    Education / Equipment: HEP   Patient agrees to discharge. Patient goals were not met. Patient is being discharged due to not returning since the last visit. Thank you for the referral. Earlie Counts, PT 02/21/22 9:37 AM

## 2022-01-15 ENCOUNTER — Encounter: Payer: Self-pay | Admitting: Family Medicine

## 2022-01-15 ENCOUNTER — Ambulatory Visit (INDEPENDENT_AMBULATORY_CARE_PROVIDER_SITE_OTHER): Payer: Medicare Other | Admitting: Family Medicine

## 2022-01-15 VITALS — BP 116/62 | HR 95 | Temp 98.9°F | Ht 61.0 in | Wt 117.4 lb

## 2022-01-15 DIAGNOSIS — E11319 Type 2 diabetes mellitus with unspecified diabetic retinopathy without macular edema: Secondary | ICD-10-CM

## 2022-01-15 DIAGNOSIS — N952 Postmenopausal atrophic vaginitis: Secondary | ICD-10-CM

## 2022-01-15 DIAGNOSIS — E119 Type 2 diabetes mellitus without complications: Secondary | ICD-10-CM

## 2022-01-15 DIAGNOSIS — R309 Painful micturition, unspecified: Secondary | ICD-10-CM | POA: Diagnosis not present

## 2022-01-15 DIAGNOSIS — E7800 Pure hypercholesterolemia, unspecified: Secondary | ICD-10-CM

## 2022-01-15 DIAGNOSIS — M549 Dorsalgia, unspecified: Secondary | ICD-10-CM

## 2022-01-15 DIAGNOSIS — Z0000 Encounter for general adult medical examination without abnormal findings: Secondary | ICD-10-CM | POA: Diagnosis not present

## 2022-01-15 DIAGNOSIS — E118 Type 2 diabetes mellitus with unspecified complications: Secondary | ICD-10-CM | POA: Diagnosis not present

## 2022-01-15 DIAGNOSIS — Z23 Encounter for immunization: Secondary | ICD-10-CM

## 2022-01-15 LAB — POCT GLYCOSYLATED HEMOGLOBIN (HGB A1C): HbA1c, POC (controlled diabetic range): 9.2 % — AB (ref 0.0–7.0)

## 2022-01-15 LAB — POCT URINALYSIS DIP (CLINITEK)
Bilirubin, UA: NEGATIVE
Blood, UA: NEGATIVE
Glucose, UA: >=1000 mg/dL — AB
Ketones, POC UA: NEGATIVE mg/dL
Leukocytes, UA: NEGATIVE
Nitrite, UA: NEGATIVE
POC PROTEIN,UA: NEGATIVE
Spec Grav, UA: <=1.005 — AB (ref 1.010–1.025)
Urobilinogen, UA: 0.2 E.U./dL
pH, UA: 6 (ref 5.0–8.0)

## 2022-01-15 MED ORDER — LIDOCAINE 5 % EX PTCH: 1.0000 | MEDICATED_PATCH | CUTANEOUS | 0 refills | Status: DC

## 2022-01-15 MED ORDER — ACCU-CHEK GUIDE VI STRP: ORAL_STRIP | 12 refills | Status: DC

## 2022-01-15 MED ORDER — ACCU-CHEK SOFTCLIX LANCETS MISC: 11 refills | Status: AC

## 2022-01-15 NOTE — Progress Notes (Unsigned)
    SUBJECTIVE:   CHIEF COMPLAINT / HPI:   T2DM Ua c/w jardiance use Regimen: jardiance 25 mg, metformin 1000 mg BID, tresiba 16 units daily Out of lancets and strips Not taking tresiba - no appetite, stomach pains Only taking the two pills Lab Results  Component Value Date   HGBA1C 9.2 (A) 01/15/2022   HGBA1C 9.7 (A) 09/19/2021   HGBA1C 9.4 (A) 04/04/2021   Lab Results  Component Value Date   MICROALBUR 10 01/31/2012   LDLCALC 57 12/19/2020   CREATININE 0.68 12/19/2020   Painful urination - vaginal atrophy, SUI [facility] 09/19/21 Ongoing symptoms of dysuria and lower pelvic spasms.  Patient is stopped using Estrace cream.  Recommend follow-up with urogyn and to start pelvic floor therapy.  Son will contact them to arrange an appointment for both.  Urogyn 11/13/21, Jaquita Folds, MD Ms. Molinari is a 66 y.o. with:  1. SUI (stress urinary incontinence, female)   2. Levator spasm   - Use estrogen cream twice a week. Can also use coconut oil for vaginal moisture as needed.  - Referral placed to pelvic physical therapy. Phone number provided for their office- pt's son will call the office to schedule.  Follow up as needed   Chart shows pelvic floor rehab 9/07, 9/14, 9/19  UA negative for infection   PERTINENT  PMH / PSH:  Patient Active Problem List   Diagnosis Date Noted   Vaginal atrophy 09/19/2021   Knee pain, bilateral 12/20/2020   Sessile colonic polyp 07/08/2019   Diabetes ([place]) 12/25/2016   Hypercholesteremia 09/24/2016   Depression 03/12/2016   Osteoarthritis 05/04/2015   Healthcare maintenance 12/22/2013   Peripheral neuropathy 12/22/2013   Diabetic retinopathy, mild, bilateral 11/11/2012   High blood pressure associated with diabetes ([place]) 05/24/2012   ASCUS (atypical squamous cells of undetermined significance) on Pap smear 11/28/2011    OBJECTIVE:   BP 116/62   Pulse 95   Ht '5\' 1"'$  (1.549 m)   Wt 117 lb 6.4 oz (53.3 kg)   SpO2 99%   BMI 22.18 kg/m    ***  ASSESSMENT/PLAN:   No problem-specific Assessment & Plan notes found for this encounter.  Diabetes Urine microalbumin - collected today A1c - stable Ref ophthalmology - today BMP for creatinine - today  Health maintenance Colonoscopy Pneumonia vaccine - today Shingles vaccine - at pharmacy Flu vaccine - today Medicare annual wellness exam Lipid panel - today DEXA scan - ordered today  Ezequiel Essex, MD [place]

## 2022-01-15 NOTE — Patient Instructions (Addendum)
I have translated the following text using Google translate.  As such, there are many errors.  I apologize for the poor written translation; however, we do not have written  translation services yet. It was wonderful to see you today. Thank you for allowing me to be a part of your care. Below is a short summary of what we discussed at your visit today: ???? Google ?????? ?????? ???? ????? ??? ?????? ????? ??? ???????, ?????? ???? ????????? ???? ? ???? ????? ???????? ???? ???? ???????; ??????, ??????? ??? ????? ?????? ??????? ?????? ?? ??????? ????? ?????? ??????? ???? ??????? ???????? ?? ?????? ???? ?????? ?????????? ???????? ?? ?????? ?????? ??????? ?? ???? ?????? ????? ???? ?????? ??:  Diabetes Today we took blood and urine samples to check on your blood sugar, your kidneys, and your cholesterol. If the results are normal, I will send you a letter or MyChart message. If the results are abnormal, I will give you a call.    I have referred you to ophthalmology (a special eye doctor) for your routine diabetic eye exam.  Someone from their office should be calling you in 1 to 2 weeks to schedule an appointment.  If you do not hear from them, let us know.  ?????? ?? ?????? ?????? ????? ????, ?????? ???????? ? ?????? ??????????? ???? ???? ??? ? ??????? ????? ?????? ??? ????? ??????? ? ???, ? ???????? ???? ???? ?? MyChart ?????? ????????? ??? ????????? ???????? ??? ???, ? ???????? ?? ????????  ???? ???????? ??????? ?????? ?????? ???? ????????? ???? ????? ??????? (?? ????? ???? ??????) ??? ???? ????? ??? ????????? ??????????? ????? ???????? 1 ???? 2 ??????? ???????????? ???????? ???? ?? ???????? ????????? ??? ??????? ?????????? ????????? ???, ??????? ???? ?????????  Painful urination and vaginal atrophy Your urine sample showed no signs of infection today. You do not need antibiotics.  Continue using the Estrace vaginal cream two times per week every week. This is important for long-term healing of the  vaginal tissue.  Use the coconut oil every day for vaginal moisturizing. This will prevent dryness and the symptom of painful urination.  ???????? ????? ? ???? ??? ?????? ??????? ??????? ?? ????????? ???? ????? ??????? ???????? ?????????????? ???????? ??????? ??????? ??? ??? ???????? ???? ????? ?????? ???? ???? ??????????? ?????? ??????? ?????????? ??????? ???? ?? ???????????? ?? ?????? ?????????????? ???? ???? ??? ??????? ??? ?????? ?????????? ???? ???????? ? ???????? ??????? ???????? ????????  Back pain Apply the lidocaine cream to the painful area of your back up to twice daily for pain relief. You may also use tylenol or ibuprofen. Make sure to follow the instructions on the bottles. ??? ?????? ????? ?? ?????? ???? ??????? ????? ????? ???? ????????? ??????? ????? ????? ??? ??? ???? ?????????? ????? ??? tylenol ?? ibuprofen ?????? ???? ??????????? ????????? ??????????? ????? ???? ??????? ??????????   Vaccines Today you received the annual flu vaccine and your pneumonia vaccine. You may experience some residual soreness at the injection site.  Gentle stretches and regular use of that arm will help speed up your recovery.  As the vaccines are giving your immune system a "practice run" against specific infections, you may feel a little under the weather for the next several days.  We recommend rest as needed and hydrating. ???? ?? ??????? ??????? ???? ??? ? ??????? ???????? ??? ??????? ????????? ??????? ???????? ?????? ???? ??????? ???? ????? ???? ??????????? ???? ?????????? ? ???? ????? ?????? ???????? ??????? ????????? ??? ??? ????? ??????? ?????????????? ??????? ?????????? ?????????? ??????? ?????????? ??????? "?????? ??" ???????? ??????, ??????? ????? ???? ??????? ?????? ????? ????? ???? ??????????? ???? ???????? ?????? ???? ? ??????????? ??????? ???????  Please bring all of your medications to every appointment! If you have any questions or concerns, please do not hesitate to contact us via  phone or MyChart message.  ????? ???????? ???????? ??????? ??? ??????? ???????????! ??? ???????? ???? ?????? ?? ?????? ? ???, ????? ??? ?? MyChart ?????? ?????? ??????? ??????? ???? ????????????????  Ezequiel Essex, MD

## 2022-01-17 ENCOUNTER — Other Ambulatory Visit (HOSPITAL_COMMUNITY): Payer: Self-pay

## 2022-01-17 ENCOUNTER — Telehealth: Payer: Self-pay

## 2022-01-17 LAB — LIPID PANEL
Chol/HDL Ratio: 4.3 ratio (ref 0.0–4.4)
Cholesterol, Total: 219 mg/dL — ABNORMAL HIGH (ref 100–199)
HDL: 51 mg/dL (ref >39)
LDL Chol Calc (NIH): 140 mg/dL — ABNORMAL HIGH (ref 0–99)
Triglycerides: 154 mg/dL — ABNORMAL HIGH (ref 0–149)
VLDL Cholesterol Cal: 28 mg/dL (ref 5–40)

## 2022-01-17 LAB — MICROALBUMIN / CREATININE URINE RATIO
Creatinine, Urine: 15.7 mg/dL
Microalb/Creat Ratio: <19 mg/g creat (ref 0–29)
Microalbumin, Urine: <3 ug/mL

## 2022-01-17 LAB — BASIC METABOLIC PANEL
BUN/Creatinine Ratio: 18 (ref 12–28)
BUN: 13 mg/dL (ref 8–27)
CO2: 22 mmol/L (ref 20–29)
Calcium: 9.5 mg/dL (ref 8.7–10.3)
Chloride: 104 mmol/L (ref 96–106)
Creatinine, Ser: 0.71 mg/dL (ref 0.57–1.00)
Glucose: 209 mg/dL — ABNORMAL HIGH (ref 70–99)
Potassium: 4.4 mmol/L (ref 3.5–5.2)
Sodium: 143 mmol/L (ref 134–144)
eGFR: 93 mL/min/{1.73_m2} (ref >59)

## 2022-01-17 NOTE — Assessment & Plan Note (Signed)
Tolerated flu and pneumonia vaccines today, no adverse side effects.  Can obtain shingles vaccine at the pharmacy.

## 2022-01-17 NOTE — Telephone Encounter (Signed)
A Prior Authorization was initiated for this patients LIDOCAINE 5% PATCHES through CoverMyMeds.   Key:  [id number]

## 2022-01-17 NOTE — Assessment & Plan Note (Signed)
Suspect painful urination due to vaginal atrophy and intermittent use of estrogen cream.  UA negative for infection today.  Recommend reinitiation of Estrace cream twice weekly and coconut oil for vaginal moisture daily.

## 2022-01-17 NOTE — Assessment & Plan Note (Signed)
Tolerating pravastatin 80 mg well without adverse side effects, plan to continue.  Lipid panel today.

## 2022-01-17 NOTE — Assessment & Plan Note (Addendum)
Stable, uncontrolled.  Patient only taking metformin and Jardiance, not taking her [name] due to decrease in appetite and stomach pains. - Referral to ophthalmology - Refer to pharmacy clinic for assistance with diabetes management - Urine microalbumin - BMP for creatinine

## 2022-01-18 ENCOUNTER — Telehealth: Payer: Self-pay | Admitting: Family Medicine

## 2022-01-18 DIAGNOSIS — E7800 Pure hypercholesterolemia, unspecified: Secondary | ICD-10-CM

## 2022-01-18 NOTE — Telephone Encounter (Signed)
Prior Auth for patients medication LIDOCAINE 5% PATCHES denied by [facility] via CoverMyMeds.   Reason: You asked for the drug above for your Acute Back Pain. This is an off-label use that is not medically accepted. Off-label use is medically accepted when there is proof in one or more of the drug guides that the drug works for your condition.   LIDOCAINE 4% PATCHES ARE AVAILABLE OTC  CoverMyMeds Key: [id number]

## 2022-01-18 NOTE — Telephone Encounter (Signed)
Called to discuss results of lipid panel. Phone Nep[place] interpreter ID [phone] utilized for phone call. No answer, left HIPPA safe VM instructing her to take the atorvastatin and return in 3 months for direct LDL recheck.   Lipid panel severely elevated, especially compared to last check. Suggests non-adherence to atorvastatin 80 mg tablet. Updated ASCVD risk 11.5%, calculation below.   Future direct LDL ordered.   Ezequiel Essex, MD   The 10-year ASCVD risk score (Arnett DK, et al., 2019) is: 11.5%   Values used to calculate the score:     Age: 66 years     Sex: Female     Is Non-Hispanic African American: No     Diabetic: Yes     Tobacco smoker: No     Systolic Blood Pressure: 462 mmHg     Is BP treated: No     HDL Cholesterol: 51 mg/dL     Total Cholesterol: 219 mg/dL

## 2022-01-31 ENCOUNTER — Other Ambulatory Visit: Payer: Self-pay

## 2022-01-31 MED ORDER — METFORMIN HCL 1000 MG PO TABS
ORAL_TABLET | ORAL | 3 refills | Status: DC
Start: 2022-01-31 — End: 2022-07-29

## 2022-02-01 ENCOUNTER — Other Ambulatory Visit: Payer: Self-pay | Admitting: Family Medicine

## 2022-02-01 DIAGNOSIS — K219 Gastro-esophageal reflux disease without esophagitis: Secondary | ICD-10-CM

## 2022-02-06 ENCOUNTER — Telehealth: Payer: Self-pay | Admitting: Family Medicine

## 2022-02-06 NOTE — Telephone Encounter (Signed)
Attempted to call patient with [place] phone interpreter. Name Sandra Ryan, [place] [id number]. Called patient to schedule diabetes management visit with Dr. Valentina Lucks. No answer, left VM.   Ezequiel Essex, MD

## 2022-02-06 NOTE — Telephone Encounter (Signed)
-----   Message from Letta Pate sent at 01/17/2022  9:42 AM EDT ----- Regarding: RE: please call for appt with koval Called with an interpreter and lvm for patient to call back and schedule appointment.   Thanks Brooke  ----- Message ----- From: Ezequiel Essex, MD Sent: 01/15/2022   2:40 PM EDT To: [place] Admin Subject: please call for appt with koval                Please call patient's son or herself with interpreter to set up appointment with Dr. Valentina Lucks for diabetes management help.   Thanks,  [employer]

## 2022-06-10 ENCOUNTER — Ambulatory Visit: Payer: Medicare Other | Admitting: Family Medicine

## 2022-06-14 ENCOUNTER — Telehealth: Payer: Self-pay | Admitting: *Deleted

## 2022-06-14 NOTE — Patient Outreach (Signed)
  Care Coordination  Initial Health Equity Health Outreach  06/14/2022 Name: Sandra Ryan MRN: [id number] DOB: [birth year]   Care Coordination Outreach Attempts This call was made with the assistance of a [place] interpreter.  An unsuccessful telephone outreach was attempted today to offer the patient information about available care coordination services as a benefit of their health plan.   Follow Up Plan:  Additional outreach attempts will be made to offer the patient care coordination information and services.   Encounter Outcome:  No Answer   Care Coordination Interventions:  No, not indicated    Rashada Klontz C. Myrtie Neither, MSN, [facility] Gerontological Nurse Practitioner [facility] Care Management [phone]

## 2022-06-17 ENCOUNTER — Other Ambulatory Visit: Payer: Self-pay | Admitting: *Deleted

## 2022-06-17 DIAGNOSIS — E118 Type 2 diabetes mellitus with unspecified complications: Secondary | ICD-10-CM

## 2022-06-17 MED ORDER — EMPAGLIFLOZIN 25 MG PO TABS: ORAL_TABLET | ORAL | 3 refills | Status: DC

## 2022-06-21 ENCOUNTER — Ambulatory Visit: Payer: Self-pay | Admitting: *Deleted

## 2022-06-21 DIAGNOSIS — E118 Type 2 diabetes mellitus with unspecified complications: Secondary | ICD-10-CM

## 2022-06-21 NOTE — Patient Outreach (Signed)
  Care Coordination   Initial Visit Note   06/21/2022 Name: Dariah Chiaverini MRN: [id number] DOB: Apr 03, 1955  Brynli Pfost is a 67 y.o. year old female who sees Ezequiel Essex, MD for primary care. I spoke with  Deneise Lever by phone today.  [name]  DID NOT SPEAK TO PT HERSELF TODAY. SON ACKNOWLEDGES SHE IS A DIABETIC AND HER LAST HGBA1C WAS 9.2 IN OCT. 2023    Goals Addressed               This Visit's Progress     Patient Stated     Will work with nurse to help reduce my Hgb A1C to < 7.0, last value was 9.2 in October 2023 (pt-stated)        Interventions Today    Flowsheet Row Most Recent Value  Chronic Disease   Chronic disease during today's visit Diabetes  General Interventions   General Interventions Discussed/Reviewed General Interventions Discussed, General Interventions Reviewed  [Pt son reports mother is out of strips and is not checking her glucose levels! Appt with PCP on Tuesday, next week. NP to advise provider and request DexCom.]  Education Interventions   Education Provided Provided Education  [Educated about [place] and how we can assist with improving mother's health status.]  Provided Verbal Education On [employer] being made to provide food resources.]  Safety Interventions   Safety Discussed/Reviewed Safety Discussed, Safety Reviewed, Home Safety              SDOH assessments and interventions completed:  Yes  SDOH Interventions Today    Flowsheet Row Most Recent Value  SDOH Interventions   Food Insecurity Interventions [id number] Referral  Housing Interventions Intervention Not Indicated  Transportation Interventions Other (Comment)  [Discussed transportation with son. Appts must always be made in consideration of his work schedule.]  Utilities Interventions Intervention Not Indicated        Care Coordination Interventions:  Yes, provided   Follow up plan:  [place] 07/02/22 AT 10:30.    Encounter Outcome:  Pt. Visit Completed   Kayleen Memos C. Myrtie Neither, MSN, [facility] Gerontological Nurse Practitioner [facility] Care Management [phone]

## 2022-06-24 ENCOUNTER — Telehealth: Payer: Self-pay

## 2022-06-24 NOTE — Telephone Encounter (Signed)
   Telephone encounter was:  Successful.  06/24/2022 Name: Sandra Ryan MRN: [id number] DOB: 11/15/1955  Sandra Ryan is a 67 y.o. year old female who is a primary care patient of Ezequiel Essex, MD . The community resource team was consulted for assistance with [place] guide performed the following interventions: Patient provided with information about care guide support team and interviewed to confirm resource needs.Patients son stated they dont have any food needs at this time   Follow Up Plan:  No further follow up planned at this time. The patient has been provided with needed resources.   [place] [phone] [address], [place], [place] [address] Phone: [phone] Email: [email or web address]

## 2022-06-25 ENCOUNTER — Encounter: Payer: Self-pay | Admitting: Family Medicine

## 2022-06-25 ENCOUNTER — Ambulatory Visit (INDEPENDENT_AMBULATORY_CARE_PROVIDER_SITE_OTHER): Payer: Medicare Other | Admitting: Family Medicine

## 2022-06-25 VITALS — BP 130/70 | HR 89 | Ht 61.0 in | Wt 117.2 lb

## 2022-06-25 DIAGNOSIS — E113293 Type 2 diabetes mellitus with mild nonproliferative diabetic retinopathy without macular edema, bilateral: Secondary | ICD-10-CM | POA: Diagnosis not present

## 2022-06-25 DIAGNOSIS — Z23 Encounter for immunization: Secondary | ICD-10-CM

## 2022-06-25 DIAGNOSIS — Z1211 Encounter for screening for malignant neoplasm of colon: Secondary | ICD-10-CM

## 2022-06-25 DIAGNOSIS — E118 Type 2 diabetes mellitus with unspecified complications: Secondary | ICD-10-CM | POA: Diagnosis not present

## 2022-06-25 DIAGNOSIS — E119 Type 2 diabetes mellitus without complications: Secondary | ICD-10-CM

## 2022-06-25 DIAGNOSIS — N952 Postmenopausal atrophic vaginitis: Secondary | ICD-10-CM

## 2022-06-25 DIAGNOSIS — Z0000 Encounter for general adult medical examination without abnormal findings: Secondary | ICD-10-CM | POA: Diagnosis not present

## 2022-06-25 LAB — POCT GLYCOSYLATED HEMOGLOBIN (HGB A1C): HbA1c, POC (prediabetic range): 9.2 % — AB (ref 5.7–6.4)

## 2022-06-25 MED ORDER — ACCU-CHEK GUIDE VI STRP: ORAL_STRIP | 12 refills | Status: AC

## 2022-06-25 MED ORDER — ACCU-CHEK SOFTCLIX LANCET DEV KIT: PACK | 0 refills | Status: AC

## 2022-06-25 MED ORDER — ZOSTER VAC RECOMB ADJUVANTED 50 MCG/0.5ML IM SUSR: 0.5000 mL | Freq: Once | INTRAMUSCULAR | 0 refills | Status: AC

## 2022-06-25 MED ORDER — ACCU-CHEK GUIDE W/DEVICE KIT: 1.0000 | PACK | Freq: Every day | 0 refills | Status: DC

## 2022-06-25 MED ORDER — ZOSTER VAC RECOMB ADJUVANTED 50 MCG/0.5ML IM SUSR: 0.5000 mL | Freq: Once | INTRAMUSCULAR | 0 refills | Status: DC

## 2022-06-25 NOTE — Progress Notes (Unsigned)
    SUBJECTIVE:   CHIEF COMPLAINT / HPI:   Video Nepali interpreter utilized for entirety of appointment.   Sandra Ryan is a pleasant 67 yo woman here for multiple chronic complaints including headache, body aches, sleep issues, and vaginal burning with urination.  T2DM A1c today unchanged from previous.  Still quite elevated at 9.2. - needs new glucometer - current meds: Metformin 1000 mg BID, Jardiance 25 mg daily - prescribed insulin Tresiba 16 units daily, however has stopped Side effects: abdominal pain, reduced appetite, felt poorly Improved when she stopped the insulin Stopped 4-6 months ago - but possibly stopped in summer - believes she previously took once weekly injection like GLP-1, but stopped due to similar side effects  Lab Results  Component Value Date   HGBA1C 9.2 (A) 06/25/2022   HGBA1C 9.2 (A) 01/15/2022   HGBA1C 9.7 (A) 09/19/2021   Lab Results  Component Value Date   MICROALBUR 10 01/31/2012   [place] 140 (H) 01/15/2022   CREATININE 0.71 01/15/2022   Urine issues  - chronic burning inside w/ urination - sees urogyn, last 11/13/21, Rx estrogen cream twice weekly, vaginal moisturizers as needed, referred to pelvic floor physical therapy - Chart indicates last saw pelvic floor PT 12/11/21, did not return to complete course of therapy  PERTINENT  PMH / PSH:  Patient Active Problem List   Diagnosis Date Noted   Vaginal atrophy 09/19/2021   Knee pain, bilateral 12/20/2020   Sessile colonic polyp 07/08/2019   Diabetes 12/25/2016   Hypercholesteremia, primary prevention, LDL goal <70 09/24/2016   Depression 03/12/2016   Osteoarthritis 05/04/2015   Healthcare maintenance 12/22/2013   Peripheral neuropathy 12/22/2013   Diabetic retinopathy, mild, bilateral 11/11/2012   High blood pressure associated with diabetes 05/24/2012   ASCUS (atypical squamous cells of undetermined significance) on Pap smear 11/28/2011    OBJECTIVE:   BP 130/70   Pulse 89   Ht  5\' 1"  (1.549 m)   Wt 117 lb 3.2 oz (53.2 kg)   SpO2 97%   BMI 22.14 kg/m    General: Awake, alert, NAD HEENT: Sclera anicteric, moist mucous membranes Cardiac: Regular rate and rhythm, no murmurs, brisk cap refill, normal skin turgor Respiratory: CTAB  ASSESSMENT/PLAN:   Diabetes (HCC) Uncontrolled, hemoglobin A1c 9.2 today. - Continue metformin and Jardiance - Sent for home glucometer - Return for visit with Dr. Everitt Amber or myself to review blood sugars -Provided information on previous referral to ophthalmology for diabetic eye exam  Vaginal atrophy Recommended use of estrogen cream as previously prescribed by urogyn.  Also recommend to return to pelvic floor physical therapy.  Healthcare maintenance - Refer for colonoscopy - Patient to be contacted regarding Medicare annual wellness visit - Shingles vaccine prescription printed and provided to patient today    Ezequiel Essex, MD [facility]

## 2022-06-25 NOTE — Patient Instructions (Addendum)
I have translated the following text using Google translate.  As such, there are many errors.  I apologize for the poor written translation; however, we do not have written  translation services yet. It was wonderful to see you today. Thank you for allowing me to be a part of your care. Below is a short summary of what we discussed at your visit today: ???? Google ?????? ?????? ???? ????? ??? ?????? ????? ??? ???????, ?????? ???? ????????? ???? ? ???? ????? ???????? ???? ???? ???????; ??????, ??????? ??? ????? ?????? ??????? ?????? ?? ??????? ????? ?????? ??????? ???? ??????? ???????? ?? ?????? ???? ?????? ?????????? ???????? ?? ?????? ?????? ??????? ?? ???? ?????? ????? ???? ?????? ??:  Diabetes I believe your very high blood sugar is the cause of your headache and body aches.  Keep taking metformin and Jardiance.  Come back for an appointment with our clinical pharmacist, Dr. Valentina Lucks, to discuss different strategies for diabetes control.  I sent a glucometer kit to your pharmacy. Please keep track of blood sugar and bring it back in to your next appointment.  Come back in one month.  ?????? ? ??????? ????? ?? ?????? ???? ???? ???? ?????? ?????? ????? ????? ? ???? ??????? ???? ??? ????????? ? ?????????? ???????????? ?????? ???????? ???????????, ??. ??????? ???????? ???? ?????? ????????, ?????? ??????????? ???? ??????? ????????? ???? ????? ???? ??????? ?????????? ?????????? ??? ?????? ????? ???? ???????? ??????? ?????????? ? ????? ??????? ????? ?????????????? ?????? ???????????? ?? ??????? ???????????  Diabetic eye exam I have referred you to the below eye clinic for your routine diabetic eye exam. Please call them for an appointment.  [facility] Address: [address], [place], [place] [address] Phone: [phone] ?????? ???? ??????? ???? ???????? ??????? ?????? ?????? ???? ????????? ???? ???? ???? ????????? ???? ????? ??? ????? ?????????? ???????? ???? ?? ?????????? [facility]  ????????? [facility] ??????: [address], [place] [id number] ???: (???) ???-????  Vaginal burning I would like you to return to the pelvic floor physical therapist you started to receive care with.  You have seen Earlie Counts at the [facility].  Please call to set up another appointment.  ???? ??? ? ???????? ?????? ????? ?????? ??????????? ????? ??????? ??? ??????? ?????? ??????? ???? ?????????? ??????? ????? ??????? [facility] ?? ?????????? ?? ????? ????? ???????????? ??? ?? ???? ?? ??????????  Medicare Annual Wellness Exam Your chart indicates that you are due for your Medicare annual wellness exam.  Your insurance likes [place] to do one of these every year.  This is a nurse only visit that takes about 30 minutes to an hour.  It can be done either in person or virtually.  This is an in-depth visit that focuses on preventative care and keeping you healthy.  Somebody from our clinic will be calling you soon to get this scheduled. ???????? ??????? ?????? ??????? ??????? ??????? ????? ??????? ???????? ??????? ?????? ????????? ???? ???? ?? ???? ????? ?????? ??????? ?????? ??????? ???????? ???? ?? ????? ???? ???? ?? ??????? ?? ?? ???? ????? ????? ?? ??? ???? 30 ????? ???? ?? ????? ?????? ?? ?? ? ????????? ?? ??????? ????? ???? ??????? ?? ?? ????? ????? ?? ??? ?????? ?????? ? ??????? ?????? ??????? ????????? ?? ?????? ?????????? ????? ?? ????????? ??????? ???? ????? ?? ???????? ?? ???????  Shingles vaccine I have printed out the Shingles vaccine. Please take this to your favorite pharmacy to obtain the Shingles  vaccine at your convenience.  ????? ??? ???? ???????? ????????? ??????? ??? ????? ??? ????? ????? ????? ??????? ???? ????? ?????????? ? ??????? ???????? ??????? ??? ??????? ??????????  Please bring all of your medications to every appointment! If you have any questions or concerns, please do not  hesitate to contact us via phone or MyChart message. ????? ???????? ???????? ??????? ??? ??????? ???????????! ??? ???????? ???? ?????? ?? ?????? ? ???, ????? ??? ?? MyChart ?????? ?????? ??????? ??????? ???? ????????????????  Ezequiel Essex, MD

## 2022-06-26 NOTE — Assessment & Plan Note (Signed)
-   Refer for colonoscopy - Patient to be contacted regarding Medicare annual wellness visit - Shingles vaccine prescription printed and provided to patient today

## 2022-06-26 NOTE — Assessment & Plan Note (Signed)
Uncontrolled, hemoglobin A1c 9.2 today. - Continue metformin and Jardiance - Sent for home glucometer - Return for visit with Dr. Everitt Amber or myself to review blood sugars -Provided information on previous referral to ophthalmology for diabetic eye exam

## 2022-06-26 NOTE — Assessment & Plan Note (Signed)
Recommended use of estrogen cream as previously prescribed by urogyn.  Also recommend to return to pelvic floor physical therapy.

## 2022-07-02 ENCOUNTER — Ambulatory Visit: Payer: Self-pay | Admitting: *Deleted

## 2022-07-02 ENCOUNTER — Encounter: Payer: Self-pay | Admitting: *Deleted

## 2022-07-02 VITALS — BP 124/64 | HR 88 | Wt 117.0 lb

## 2022-07-02 DIAGNOSIS — E1165 Type 2 diabetes mellitus with hyperglycemia: Secondary | ICD-10-CM

## 2022-07-02 NOTE — Patient Outreach (Signed)
  Care Coordination   Initial Visit Note   07/02/2022 Name: Sandra Ryan MRN: [id number] DOB: 1955-07-28  Sandra Ryan is a 67 y.o. year old female who sees Fayette Pho, MD for primary care. I visited  Sandra Ryan in their home today.  [name]  Getting DM testing supplies and medicines. She had side effects from [place], perhaps something else. Needs an alternative to atorvastatin (GERD).  Needs eye exam, (reports the office that she went to previously on wrong date said they need a referral from MD.  Social contacts Sandra Ryan.    Goals Addressed               This Visit's Progress     Patient Stated     Will work with nurse to help reduce my Hgb A1C to < 7.0, last value was 9.2 in October 2023 (pt-stated)        Interventions Today    Flowsheet Row Most Recent Value  Chronic Disease   Chronic disease during today's visit Diabetes  General Interventions   General Interventions Discussed/Reviewed General Interventions Discussed, General Interventions Reviewed, Annual Eye Exam, Labs, Annual Foot Exam, Lipid Profile, Durable Medical Equipment (DME), Vaccines, Health Screening, Sick Day Rules, [facility], Doctor Visits  Labs Hgb A1c every 3 months  Doctor Visits Discussed/Reviewed Doctor Visits Reviewed  Horticulturist, commercial (DME) Other  Vira Agar desired.]  Exercise Interventions   Exercise Discussed/Reviewed Exercise Discussed  Education Interventions   Education Provided Provided Education  Provided Verbal Education On Nutrition, Foot Care, Eye Care, Labs, Blood Sugar Monitoring, Mental Health/Coping with Illness, Exercise, Medication, When to see the doctor  Labs Reviewed Hgb A1c  [Last Hgb A1C 9.2]  Mental Health Interventions   Mental Health Discussed/Reviewed Mental Health Discussed, Depression  [Advise MD]  Nutrition Interventions   Nutrition Discussed/Reviewed Nutrition Discussed, Nutrition Reviewed,  Carbohydrate meal planning, Portion sizes  Pharmacy Interventions   Pharmacy Dicussed/Reviewed Pharmacy Topics Discussed  Advanced Directive Interventions   Advanced Directives Discussed/Reviewed Advanced Directives Discussed                SDOH assessments and interventions completed:  Yes  SDOH Interventions Today    Flowsheet Row Most Recent Value  SDOH Interventions   Food Insecurity Interventions Intervention Not Indicated  Transportation Interventions Intervention Not Indicated  Alcohol Usage Interventions Intervention Not Indicated (Score <7)  Financial Strain Interventions Intervention Not Indicated  Physical Activity Interventions Other (Comments)  [Encouraged home YOGA.]  Stress Interventions Intervention Not Indicated  Social Connections Interventions [id number] Referral        Care Coordination Interventions:  Yes, provided   Follow up plan:  [facility] Care Manager to schedule a visit next month.    Encounter Outcome:  Pt. Visit Completed   Noralyn Pick C. Burgess Estelle, MSN, [facility] Gerontological Nurse Practitioner [facility] Care Management [phone]

## 2022-07-04 NOTE — Patient Outreach (Signed)
  Care Coordination   Case Collaboration     07/04/2022 Name: Sandra Ryan MRN: [id number] DOB: 1955-08-04  Sandra Ryan is a 67 y.o. year old female who sees Sandra Pho, MD for primary care. I  received a referral from RN Care Manager requesting SW research opportunities of engagement for the patient. SW was able to locate the Sandra Ryan in [place] as well as the Sandra Ryan located in central [place]. SW collaborated with Medical illustrator Sandra Ryan to provide Sandra Ryan resources for [name]. RN Care Manager to provide patient with information during future appointment.   SDOH assessments and interventions completed:  No     Care Coordination Interventions:  Yes, provided   Interventions Today    Flowsheet Row Most Recent Value  Chronic Disease   Chronic disease during today's visit Diabetes  General Interventions   General Interventions Discussed/Reviewed Communication with, Risk analyst with RN        Follow up plan: No further intervention required.   Encounter Outcome:  Pt. Visit Completed   Bevelyn Ngo, BSW, CDP Social Worker, Certified Dementia Practitioner [facility] Care Management  Care Coordination [phone]

## 2022-07-15 ENCOUNTER — Other Ambulatory Visit: Payer: Self-pay | Admitting: Student

## 2022-07-15 DIAGNOSIS — K219 Gastro-esophageal reflux disease without esophagitis: Secondary | ICD-10-CM

## 2022-07-22 ENCOUNTER — Telehealth: Payer: Self-pay | Admitting: *Deleted

## 2022-07-22 NOTE — Progress Notes (Unsigned)
  Care Coordination Note  07/22/2022 Name: [name] MRN: [id number] DOB: Jan 22, 1956  Sandra Ryan is a 67 y.o. year old female who is a primary care patient of Fayette Pho, MD and is actively engaged with the care management team. I reached out to [facility] by phone today to assist with re-scheduling a follow up visit with the RN Case Manager  Follow up plan: A telephone outreach attempt made son was driving and requested a call back.   [facility]  Care Coordination Care Guide  Direct Dial: [phone]

## 2022-07-29 ENCOUNTER — Encounter: Payer: Self-pay | Admitting: Family Medicine

## 2022-07-29 ENCOUNTER — Ambulatory Visit (INDEPENDENT_AMBULATORY_CARE_PROVIDER_SITE_OTHER): Payer: Medicare Other | Admitting: Family Medicine

## 2022-07-29 VITALS — BP 104/57 | HR 96 | Ht 61.0 in | Wt 117.6 lb

## 2022-07-29 DIAGNOSIS — E113293 Type 2 diabetes mellitus with mild nonproliferative diabetic retinopathy without macular edema, bilateral: Secondary | ICD-10-CM | POA: Diagnosis not present

## 2022-07-29 DIAGNOSIS — M159 Polyosteoarthritis, unspecified: Secondary | ICD-10-CM

## 2022-07-29 DIAGNOSIS — M1990 Unspecified osteoarthritis, unspecified site: Secondary | ICD-10-CM

## 2022-07-29 DIAGNOSIS — E118 Type 2 diabetes mellitus with unspecified complications: Secondary | ICD-10-CM | POA: Diagnosis not present

## 2022-07-29 DIAGNOSIS — E1169 Type 2 diabetes mellitus with other specified complication: Secondary | ICD-10-CM

## 2022-07-29 DIAGNOSIS — E785 Hyperlipidemia, unspecified: Secondary | ICD-10-CM

## 2022-07-29 MED ORDER — EMPAGLIFLOZIN 25 MG PO TABS: ORAL_TABLET | ORAL | 3 refills | Status: DC

## 2022-07-29 MED ORDER — ATORVASTATIN CALCIUM 80 MG PO TABS: ORAL_TABLET | ORAL | 0 refills | Status: DC

## 2022-07-29 MED ORDER — METFORMIN HCL 1000 MG PO TABS: ORAL_TABLET | ORAL | 3 refills | Status: DC

## 2022-07-29 MED ORDER — MELOXICAM 7.5 MG PO TABS: 7.5000 mg | ORAL_TABLET | Freq: Every day | ORAL | 0 refills | Status: DC

## 2022-07-29 NOTE — Patient Instructions (Signed)
I have translated the following text using Google translate.  As such, there are many errors.  I apologize for the poor written translation; however, we do not have written  translation services yet. It was wonderful to see you today. Thank you for allowing me to be a part of your care. Below is a short summary of what we discussed at your visit today:  Diabetes I will try to send the fax to your pharmacy or insurance to get the blood sugar monitor covered.  Pain in all joints I will send you for x-rays.  Please go to [facility] imaging, information below. Take meloxicam once daily for 1 to 2 weeks.  This is to reduce inflammation.  Afterwards, use only when needed.  Use sparingly. [facility] [address] [place], [place], [place] [address] [phone] Mon-Fri 8-5        Please bring all of your medications to every appointment! If you have any questions or concerns, please do not hesitate to contact us via phone or MyChart message.   Fayette Pho, MD

## 2022-07-29 NOTE — Assessment & Plan Note (Addendum)
Refilled metformin and Jardiance per son's request. Insurance denied glucometer, unable to check glucose at home. Patient politely declines injectable medication such as GLP1s.  No changes to regimen at this time.

## 2022-07-29 NOTE — Assessment & Plan Note (Signed)
Likely source of overall joint pain.  No prior x-rays, will obtain x-rays of bilateral hands and knees today.  Rx meloxicam low-dose daily x 7 to 14 days then as needed use afterwards.  With complaint of muscle weakness and difficulty reaching overhead, did consider rheumatic diagnoses such as RA, polymyalgia rheumatica, myositis, and fibromyalgia.  However, we will start with conservative management.  If no improvement, could move to more workup.

## 2022-07-29 NOTE — Progress Notes (Signed)
   SUBJECTIVE:   CHIEF COMPLAINT / HPI:  Nepali interpreter [employer] ID [phone] used for entirety of visit Son is present with patient and provides some of the history  Diabetes Denied glucometer, unable to check blood sugars Son reports that the pharmacy told him we need to send a fax to insurance company to get the glucometer covered Metformin 1000 mg BID, tolerating without adverse side effects Lab Results  Component Value Date   HGBA1C 9.2 (A) 06/25/2022   HGBA1C 9.2 (A) 01/15/2022   HGBA1C 9.7 (A) 09/19/2021   Lab Results  Component Value Date   MICROALBUR 10 01/31/2012   LDLCALC 140 (H) 01/15/2022   CREATININE 0.71 01/15/2022   Pain all over, all joints, in chest Intermittent occurrence, duration of 3 to 4 days each episode Unable to pinpoint how long this has been going on Patient reports it affects all joints and oftentimes muscle Worse with poor weather  During the episodes, she has difficulty lifting even bottle of water and difficulty walking due to pain Denies joint redness or swelling No falls Some ibuprofen for severe pain - moderate relief Some history of difficulty reaching overhead, but due to language barrier unclear if this is because of muscle pain and weakness or simply because she is short and cannot reach the cabinets  PERTINENT  PMH / PSH:  Patient Active Problem List   Diagnosis Date Noted   Vaginal atrophy 09/19/2021   Knee pain, bilateral 12/20/2020   Sessile colonic polyp 07/08/2019   Diabetes (HCC) 12/25/2016   Hypercholesteremia, primary prevention, LDL goal <70 09/24/2016   Depression 03/12/2016   Osteoarthritis 05/04/2015   Healthcare maintenance 12/22/2013   Peripheral neuropathy 12/22/2013   Diabetic retinopathy, mild, bilateral 11/11/2012   High blood pressure associated with diabetes (HCC) 05/24/2012   ASCUS (atypical squamous cells of undetermined significance) on Pap smear 11/28/2011    OBJECTIVE:   BP (!) 104/57   Pulse 96   Ht  5\' 1"  (1.549 m)   Wt 117 lb 9.6 oz (53.3 kg)   SpO2 100%   BMI 22.22 kg/m    General: Awake, alert, NAD Extremities: Bilateral elbows, wrists, hands, knees and ankles overall unremarkable without obvious deformity or swelling; no red swollen joints, no rashes or lesions, no TTP Wrists: No obvious effusions or redness, full ROM (Extension, flexion, lateral medial deviation) with only mild reported pain Fingers: MP joints without effusion or redness, full ROM without pain Knees: No effusions or redness, no TTP over patella, joint lines, patellar tendon, patellar crepitus appreciated on palpation with extension of leg, no patellar maltracking noted bilaterally Neuro: BUE strength 5/5 and equal bilaterally of grip, push, and pull; BLE strength 5/5 and equal bilaterally of hips and knees  ASSESSMENT/PLAN:   Osteoarthritis Likely source of overall joint pain.  No prior x-rays, will obtain x-rays of bilateral hands and knees today.  Rx meloxicam low-dose daily x 7 to 14 days then as needed use afterwards.  With complaint of muscle weakness and difficulty reaching overhead, did consider rheumatic diagnoses such as RA, polymyalgia rheumatica, myositis, and fibromyalgia.  However, we will start with conservative management.  If no improvement, could move to more workup.  Diabetes (HCC) Refilled metformin and Jardiance per son's request. Insurance denied glucometer, unable to check glucose at home. Patient politely declines injectable medication such as GLP1s.  No changes to regimen at this time.     Fayette Pho, MD [facility]

## 2022-07-29 NOTE — Progress Notes (Unsigned)
  Care Coordination Note  07/29/2022 Name: Sandra Ryan MRN: [id number] DOB: 1955/08/01  Sandra Ryan is a 67 y.o. year old female who is a primary care patient of Fayette Pho, MD and is actively engaged with the care management team. I reached out to [facility] by phone today to assist with re-scheduling a follow up visit with the RN Case Manager  Follow up plan: Unsuccessful telephone outreach attempt made. A HIPAA compliant phone message was left for the patient providing contact information and requesting a return call.   [facility]  Care Coordination Care Guide  Direct Dial: [phone]

## 2022-07-30 NOTE — Progress Notes (Signed)
  Care Coordination Note  07/30/2022 Name: Deva Maltez MRN: [id number] DOB: [birth year]  Sandra Ryan is a 67 y.o. year old female who is a primary care patient of Fayette Pho, MD and is actively engaged with the care management team. I reached out to [facility] by phone today to assist with re-scheduling a follow up visit with the RN Case Manager  Follow up plan: Unsuccessful telephone outreach attempt made. A HIPAA compliant phone message was left for the patient providing contact information and requesting a return call.  We have been unable to make contact with the patient for follow up. The care management team is available to follow up with the patient after provider conversation with the patient regarding recommendation for care management engagement and subsequent re-referral to the care management team.

## 2022-08-26 ENCOUNTER — Other Ambulatory Visit: Payer: Self-pay | Admitting: Family Medicine

## 2022-09-19 ENCOUNTER — Encounter: Payer: Self-pay | Admitting: Family Medicine

## 2022-10-10 ENCOUNTER — Ambulatory Visit (INDEPENDENT_AMBULATORY_CARE_PROVIDER_SITE_OTHER): Payer: Medicare Other | Admitting: Family Medicine

## 2022-10-10 ENCOUNTER — Encounter: Payer: Self-pay | Admitting: Family Medicine

## 2022-10-10 ENCOUNTER — Other Ambulatory Visit: Payer: Self-pay

## 2022-10-10 VITALS — BP 126/70 | HR 95 | Ht 61.0 in | Wt 119.4 lb

## 2022-10-10 DIAGNOSIS — E113293 Type 2 diabetes mellitus with mild nonproliferative diabetic retinopathy without macular edema, bilateral: Secondary | ICD-10-CM | POA: Diagnosis not present

## 2022-10-10 DIAGNOSIS — R300 Dysuria: Secondary | ICD-10-CM

## 2022-10-10 LAB — POCT UA - MICROSCOPIC ONLY
RBC, Urine, Miroscopic: NONE SEEN (ref 0–2)
WBC, Ur, HPF, POC: NONE SEEN (ref 0–5)

## 2022-10-10 LAB — POCT URINALYSIS DIP (MANUAL ENTRY)
Bilirubin, UA: NEGATIVE
Blood, UA: NEGATIVE
Glucose, UA: >=1000 mg/dL — AB
Ketones, POC UA: NEGATIVE mg/dL
Leukocytes, UA: NEGATIVE
Nitrite, UA: NEGATIVE
Protein Ur, POC: NEGATIVE mg/dL
Spec Grav, UA: 1.015 (ref 1.010–1.025)
Urobilinogen, UA: 0.2 E.U./dL
pH, UA: 7 (ref 5.0–8.0)

## 2022-10-10 LAB — POCT GLYCOSYLATED HEMOGLOBIN (HGB A1C): HbA1c, POC (controlled diabetic range): 10.1 % — AB (ref 0.0–7.0)

## 2022-10-10 NOTE — Patient Instructions (Addendum)
Thank you for coming in today. It is a pleasure to take care of you.  Today we discussed your burning with urination. - Please make sure to drink at least 64 ounces of water every day (8 cups) - Take all of your medications as prescribed - Follow up in 1-2 weeks  Take care, Dr. Mliss Sax  Dysuria Dysuria is pain or discomfort during urination. The pain or discomfort may be felt in the part of the body that drains urine from the bladder (urethra) or in the surrounding tissue of the genitals. The pain may also be felt in the groin area, lower abdomen, or lower back. You may have to urinate frequently or have the sudden feeling that you have to urinate (urgency). Dysuria can affect anyone, but it is more common in females. Dysuria can be caused by many different things, including: Urinary tract infection. Kidney stones or bladder stones. Certain STIs (sexually transmitted infections), such as chlamydia. Dehydration. Inflammation of the tissues of the vagina. Use of certain medicines. Use of certain soaps or scented products that cause irritation. Follow these instructions at home: Medicines Take over-the-counter and prescription medicines only as told by your health care provider. If you were prescribed an antibiotic medicine, take it as told by your health care provider. Do not stop taking the antibiotic even if you start to feel better. Eating and drinking  Drink enough fluid to keep your urine pale yellow. Avoid caffeinated beverages, tea, and alcohol. These beverages can irritate the bladder and make dysuria worse. In males, alcohol may irritate the prostate. General instructions Watch your condition for any changes. Urinate often. Avoid holding urine for long periods of time. If you are female, you should wipe from front to back after urinating or having a bowel movement. Use each piece of toilet paper only once. Empty your bladder after sex. Keep all follow-up visits. This is  important. If you had any tests done to find the cause of dysuria, it is up to you to get your test results. Ask your health care provider, or the department that is doing the test, when your results will be ready. Contact a health care provider if: You have a fever. You develop pain in your back or sides. You have nausea or vomiting. You have blood in your urine. You are not urinating as often as you usually do. Get help right away if: Your pain is severe and not relieved with medicines. You cannot eat or drink without vomiting. You are confused. You have a rapid heartbeat while resting. You have shaking or chills. You feel extremely weak. Summary Dysuria is pain or discomfort while urinating. Many different conditions can lead to dysuria. If you have dysuria, you may have to urinate frequently or have the sudden feeling that you have to urinate (urgency). Watch your condition for any changes. Keep all follow-up visits. Make sure that you urinate often and drink enough fluid to keep your urine pale yellow. This information is not intended to replace advice given to you by your health care provider. Make sure you discuss any questions you have with your health care provider. Document Revised: 10/22/2019 Document Reviewed: 10/22/2019 Elsevier Patient Education  2024 [employer].

## 2022-10-10 NOTE — Progress Notes (Signed)
    SUBJECTIVE:   CHIEF COMPLAINT / HPI:  Patient declined video interpreter.  Family member provided interpreting services instead.  Sandra Ryan is a very pleasant 67 year old woman here with complaint of burning with urination ongoing for 6 months.  She also endorses urgency and frequency she feels like her urine comes in a trickle and it takes her 1 to 2 minutes to empty her bladder.  She also describes low back pain also ongoing for the last 6 months.  Urinary issues -Patient has chronic burning with urination -Patient has seen urogynecology in 2023, and been referred to pelvic floor PT.  It appears the patient saw PT in September 2023 but did not have any follow-up visits. -Patient was previously prescribed estradiol vaginal cream, though she is no longer using this because she did not feel like it helped -UA in October 2023 was negative  PERTINENT  PMH / PSH:  Patient Active Problem List   Diagnosis Date Noted   Vaginal atrophy 09/19/2021   Knee pain, bilateral 12/20/2020   Sessile colonic polyp 07/08/2019   Diabetes (HCC) 12/25/2016   Hypercholesteremia, primary prevention, LDL goal <70 09/24/2016   Depression 03/12/2016   Osteoarthritis 05/04/2015   Healthcare maintenance 12/22/2013   Peripheral neuropathy 12/22/2013   Diabetic retinopathy, mild, bilateral 11/11/2012   High blood pressure associated with diabetes (HCC) 05/24/2012   ASCUS (atypical squamous cells of undetermined significance) on Pap smear 11/28/2011    OBJECTIVE:   BP 126/70   Pulse 95   Ht 5\' 1"  (1.549 m)   Wt 119 lb 6.4 oz (54.2 kg)   SpO2 98%   BMI 22.56 kg/m   General: pleasant, no acute distress Cardio: RRR, no murmurs on exam. Pulm: CTA bilaterally. No increased work of breathing Abdominal: soft, non-tender, non-distended, no CVA tenderness Extremities: no peripheral edema    ASSESSMENT/PLAN:   No problem-specific Assessment & Plan notes found for this encounter. Urinary issues UA  today was negative for evidence of infection, however urinary glucose was very high. Postvoid bladder scan was negative for evidence of urinary retention; residual urine was less than 150 mL.   A1c today was 10.1, an increase from 3 months ago. Suspect poor control of diabetes is contributing to symptoms. Patient stopped her insulin some months back due to side effects. Plan for patient to follow up in 1-2 weeks to discuss diabetes and diabetic control. Patient is agreeable to this plan.   Cyndia Skeeters, DO [facility]

## 2022-10-12 LAB — URINE CULTURE: Organism ID, Bacteria: NO GROWTH

## 2022-10-25 ENCOUNTER — Ambulatory Visit: Payer: Medicare Other | Admitting: Family Medicine

## 2022-11-18 ENCOUNTER — Other Ambulatory Visit: Payer: Self-pay | Admitting: Family Medicine

## 2022-11-18 DIAGNOSIS — K219 Gastro-esophageal reflux disease without esophagitis: Secondary | ICD-10-CM

## 2022-11-19 ENCOUNTER — Ambulatory Visit (INDEPENDENT_AMBULATORY_CARE_PROVIDER_SITE_OTHER): Payer: Medicare Other | Admitting: Family Medicine

## 2022-11-19 ENCOUNTER — Encounter: Payer: Self-pay | Admitting: Family Medicine

## 2022-11-19 VITALS — BP 122/62 | HR 82 | Wt 121.0 lb

## 2022-11-19 DIAGNOSIS — E113293 Type 2 diabetes mellitus with mild nonproliferative diabetic retinopathy without macular edema, bilateral: Secondary | ICD-10-CM

## 2022-11-19 MED ORDER — ACCU-CHEK AVIVA PLUS W/DEVICE KIT: 1.0000 | PACK | Freq: Two times a day (BID) | 0 refills | Status: DC

## 2022-11-19 MED ORDER — GLIPIZIDE 5 MG PO TABS: 5.0000 mg | ORAL_TABLET | Freq: Two times a day (BID) | ORAL | 3 refills | Status: DC

## 2022-11-19 NOTE — Progress Notes (Signed)
    SUBJECTIVE:   CHIEF COMPLAINT / HPI:  Pt seen today with son as interpreter.  In person female interpreter was requested but was not available.  Patient declined iPad/virtual interpreter.  Diabetes  A1c in July 10.1. Pt had stopped taking all insulin due to side effects.  Continues to take metformin and Jardiance as prescribed.  Does not check blood sugar at home due to not being able to obtain prescribed glucometer from pharmacy.  Patient denies any concerns at this time.  Urinary Issues Patient hoped to follow-up on this issue today, however she was only comfortable discussing it with female interpreter present.  Unfortunately this was not an option today.  Patient declined talking about this concern any further at this time.   PERTINENT  PMH / PSH:   Past Medical History:  Diagnosis Date   Abnormal Pap smear    Cystocele    Diabetes mellitus without complication (HCC)    GERD (gastroesophageal reflux disease)    Hemorrhoid    Sleep apnea    Tuberculosis    took medication for 4 months     OBJECTIVE:   BP 122/62   Pulse 82   Wt 121 lb (54.9 kg)   SpO2 98%   BMI 22.86 kg/m   General: Well-appearing, no acute distress Cardio: Regular rate, regular rhythm, no murmurs on exam. Pulm: Clear, no wheezing, no crackles. No increased work of breathing Abdominal: bowel sounds present, soft, non-tender, non-distended  ASSESSMENT/PLAN:   Diabetes (HCC) Uncontrolled. Most recent A1c 10.1 last month.  Long conversation with patient about importance of controlling blood sugars; she has tried insulin in the past and felt that she had too many side effects.  She has not tried GLP-1's. She declines insulin or GLP-1's today.  Could not elicit if aversion was related to the injection itself, or side effects from the insulin. -Start glipizide 5 mg twice daily.  I have sent this to your pharmacy. -Continue metformin 1000 mg twice daily, continue Jardiance 25 mg once daily -I sent in  additional orders for glucometer to patient's pharmacy.  Instructed her to check blood sugar daily and write these down in a log to bring with her. -Follow-up in 3 months for A1c  Urogenital complaint Unfortunately could not address this today due to lack of in person female interpreter.  Patient was understandably not comfortable with any other interpreter options.  Patient will make follow-up appointment for 2 weeks and I will again request a female interpreter to be available at this time.   Cyndia Skeeters, DO [facility]

## 2022-11-19 NOTE — Patient Instructions (Addendum)
Dear Sandra Ryan  Today we discussed the following concerns and plans:  Diabetes -Continue taking your metformin 2 times a day and your Jardiance once a day -Start taking your new medicine glipizide 5 mg 2 times a day. -I will send another order for a blood glucose machine to your pharmacy.  If you are able to get this please check your blood sugar every day and write down the number.  Please schedule 2 appointments to follow up: 1) in 2 weeks to discuss urinary issues 2) in 3 months to follow-up on diabetes control and A1c  ?????? - ????? ? ??? ????? ?????????? ? ????? ?? ??? ?????????? ??? ???? ?????????? - ????? ???? ???? ?????????? 5 ????????? 2 ??? ????? 2 ??? ??? ???? ?????????? -? ??????? ?????????? ???? ??????? ??????? ???? ????? ????? ?????????  ??? ????? ?? ??????? ???? ????? ????????? ??? ????? ???? ??? ????? ???? ???? ???? ????????? ? ????? ???????????  ????? ????? ?????? ???? ? ??????????????? ??????? ?????????: ?) ????????????? ?????????? ???? ???? ? ??????? ?) ?????? ????????? ? ? ? ?? ?? ????? ???? 3 ???????  madhumeh - dinko ? patak afno metformin r dinma ek patak zardians lin jari rakhnuhos - afno nayaan aushadhi glipizide 5 milligram 2 patak dinko 2 patak lin suru garnuhosha -m tapainko farmesima rakt glucose meshinko lagi arko arder pathaunechhu yadi tapaain yo praapt garna saksham hunuhunchha bhane kripaya harek din afno blud sugar jaanch garnuhos r nambar lekhnuhosha  kripaya faloap garnaka lagi ? appointmentharu anusuchi garnuhos: ?) pisabsambandhi samasyabare chhalfal garna ? haptama ?) madhumeh niyantran r ai ? see ma faloap garna 3 mahinama   If you have any concerns, please call the clinic or schedule an appointment.  It was a pleasure to take care of you today. Be well!  Cyndia Skeeters, DO [facility], PGY-1

## 2022-11-19 NOTE — Assessment & Plan Note (Addendum)
Uncontrolled. Most recent A1c 10.1 last month.  Long conversation with patient about importance of controlling blood sugars; she has tried insulin in the past and felt that she had too many side effects.  She has not tried GLP-1's. She declines insulin or GLP-1's today.  Could not elicit if aversion was related to the injection itself, or side effects from the insulin. -Start glipizide 5 mg twice daily.  I have sent this to your pharmacy. -Continue metformin 1000 mg twice daily, continue Jardiance 25 mg once daily -I sent in additional orders for glucometer to patient's pharmacy.  Instructed her to check blood sugar daily and write these down in a log to bring with her. -Follow-up in 3 months for A1c

## 2022-12-16 ENCOUNTER — Ambulatory Visit (INDEPENDENT_AMBULATORY_CARE_PROVIDER_SITE_OTHER): Payer: Medicare Other | Admitting: Family Medicine

## 2022-12-16 ENCOUNTER — Other Ambulatory Visit: Payer: Self-pay

## 2022-12-16 ENCOUNTER — Encounter: Payer: Self-pay | Admitting: Family Medicine

## 2022-12-16 VITALS — BP 149/88 | HR 92 | Ht 61.0 in | Wt 121.4 lb

## 2022-12-16 DIAGNOSIS — Z23 Encounter for immunization: Secondary | ICD-10-CM | POA: Diagnosis not present

## 2022-12-16 DIAGNOSIS — G8929 Other chronic pain: Secondary | ICD-10-CM

## 2022-12-16 DIAGNOSIS — Z7984 Long term (current) use of oral hypoglycemic drugs: Secondary | ICD-10-CM

## 2022-12-16 DIAGNOSIS — I152 Hypertension secondary to endocrine disorders: Secondary | ICD-10-CM | POA: Diagnosis not present

## 2022-12-16 DIAGNOSIS — M545 Low back pain, unspecified: Secondary | ICD-10-CM | POA: Diagnosis not present

## 2022-12-16 DIAGNOSIS — E1159 Type 2 diabetes mellitus with other circulatory complications: Secondary | ICD-10-CM | POA: Diagnosis not present

## 2022-12-16 DIAGNOSIS — M1990 Unspecified osteoarthritis, unspecified site: Secondary | ICD-10-CM

## 2022-12-16 DIAGNOSIS — N3941 Urge incontinence: Secondary | ICD-10-CM | POA: Diagnosis not present

## 2022-12-16 DIAGNOSIS — E113293 Type 2 diabetes mellitus with mild nonproliferative diabetic retinopathy without macular edema, bilateral: Secondary | ICD-10-CM | POA: Diagnosis not present

## 2022-12-16 DIAGNOSIS — Z0000 Encounter for general adult medical examination without abnormal findings: Secondary | ICD-10-CM

## 2022-12-16 DIAGNOSIS — N952 Postmenopausal atrophic vaginitis: Secondary | ICD-10-CM

## 2022-12-16 DIAGNOSIS — R300 Dysuria: Secondary | ICD-10-CM | POA: Diagnosis not present

## 2022-12-16 DIAGNOSIS — E118 Type 2 diabetes mellitus with unspecified complications: Secondary | ICD-10-CM

## 2022-12-16 LAB — POCT GLUCOSE FINGERSTICK: Glucose: 147 — AB (ref 70–99)

## 2022-12-16 MED ORDER — ACCU-CHEK AVIVA PLUS W/DEVICE KIT
1.0000 | PACK | Freq: Two times a day (BID) | 0 refills | Status: AC
Start: 2022-12-16 — End: ?

## 2022-12-16 MED ORDER — DICLOFENAC SODIUM 1 % EX GEL
4.0000 g | Freq: Four times a day (QID) | CUTANEOUS | 0 refills | Status: DC
Start: 2022-12-16 — End: 2023-01-14

## 2022-12-16 NOTE — Assessment & Plan Note (Addendum)
Suspect this based on patient's description of needing to urinate suddenly, +leakage. - consider trial myrbetriq in the future

## 2022-12-16 NOTE — Assessment & Plan Note (Addendum)
This is a chronic problem. Previously UA negative for UTI and bladder scan without evidence of retention. No back pain or other systemic symptoms to suspect infection. Suspicion for symptoms caused by Jardiance; last UA showed high urinary glucose. - will trial d/c Jardiance for 2 weeks and see if symptoms improve. Obviously there is some concern for diabetic control if jardiance is discontinued. - Close follow up and monitoring of symptoms and blood glucose.

## 2022-12-16 NOTE — Patient Instructions (Addendum)
Dear Sandra Ryan  Today we discussed the following concerns and plans:  Urinary Discomfort - Please STOP taking Jardiance for 2 weeks to see if your symptoms improve - Follow up with me in 2-3 weeks    If you have any concerns, please call the clinic or schedule an appointment.  It was a pleasure to take care of you today. Be well!  Cyndia Skeeters, DO [facility], PGY-1

## 2022-12-16 NOTE — Assessment & Plan Note (Signed)
POC glucose today 147. Patient does not have ability to check blood sugar at home as she states she cannot afford meter and her insurance does not cover one. I have tried in the past to send one to her pharmacy that may be covered. I will attempt to resend this again today. - have counseled patient about importance of checking sugars and good glucose control, especially as we are discontinuing Jardiance today due to severity of urinary symptoms. - Patient is also very opposed to starting GLP-1; she has tried this in the past and lost her appetite and felt very poorly.

## 2022-12-16 NOTE — Assessment & Plan Note (Signed)
On recheck 149/88.  - Will monitor at next visit in 2 weeks and consider starting ACEI if remains high. - Pt will also monitor at home and bring log.

## 2022-12-16 NOTE — Assessment & Plan Note (Signed)
Flu vaccine today

## 2022-12-16 NOTE — Progress Notes (Signed)
    SUBJECTIVE:   CHIEF COMPLAINT / HPI:  Sandra Ryan is a very pleasant 67 year old female who presents to the clinic today alone. Our discussion is aided by in-person interpreter.  Urinary issues Chronic burning with urination, now pt describes burning "all the time." She does have history of Vaginal atrophy, prescribed estrace, which she said did not help. Has seen Pelvic Floor PT once but did not return. Denies vaginal odor, discharge, bleeding, or blood in urine or stool.   Elevated blood pressure Initially 155/94. On recheck 149/88. Patient does check at home occasionally but cannot remember what the numbers are.   PERTINENT  PMH / PSH:  Past Medical History:  Diagnosis Date   Abnormal Pap smear    Cystocele    Diabetes mellitus without complication (HCC)    GERD (gastroesophageal reflux disease)    Hemorrhoid    Sleep apnea    Tuberculosis    took medication for 4 months     OBJECTIVE:   BP (!) 149/88   Pulse 92   Ht 5\' 1"  (1.549 m)   Wt 121 lb 6.4 oz (55.1 kg)   SpO2 100%   BMI 22.94 kg/m   General: Well-appearing, no acute distress. Cardio: RRR Pulm: Clear, No increased work of breathing. Abdominal: bowel sounds present, soft, non-tender, non-distended. No CVA tenderness. Extremities: no peripheral edema. Moves all extremities equally.  ASSESSMENT/PLAN:   Dysuria This is a chronic problem. Previously UA negative for UTI and bladder scan without evidence of retention. No back pain or other systemic symptoms to suspect infection. Suspicion for symptoms caused by Jardiance; last UA showed high urinary glucose. - will trial d/c Jardiance for 2 weeks and see if symptoms improve. Obviously there is some concern for diabetic control if jardiance is discontinued. - Close follow up and monitoring of symptoms and blood glucose.  Urge incontinence Suspect this based on patient's description of needing to urinate suddenly, +leakage. - consider trial myrbetriq in the  future  Healthcare maintenance - Flu vaccine today  High blood pressure associated with diabetes (HCC) On recheck 149/88.  - Will monitor at next visit in 2 weeks and consider starting ACEI if remains high. - Pt will also monitor at home and bring log.  Diabetes (HCC) POC glucose today 147. Patient does not have ability to check blood sugar at home as she states she cannot afford meter and her insurance does not cover one. I have tried in the past to send one to her pharmacy that may be covered. I will attempt to resend this again today. - have counseled patient about importance of checking sugars and good glucose control, especially as we are discontinuing Jardiance today due to severity of urinary symptoms. - Patient is also very opposed to starting GLP-1; she has tried this in the past and lost her appetite and felt very poorly.   Refilled Voltaren gel today per patient request.   Sandra Skeeters, DO [facility]

## 2022-12-29 NOTE — Progress Notes (Unsigned)
    SUBJECTIVE:   CHIEF COMPLAINT / HPI: diabetes/HTN/Urge Incontinence  Diabetes At her last visit, her [place] Pepper was discontinued due to chronic dysuria she has been experiencing. Due to inability to afford a CGM, patient comes in today for close follow to see how much her sugars were affected by this change. However, son and patient could not verify which medications she is currently taking vs not.  HTN Blood pressure elevated today. Son reports that her pressures are in the 130s at home once, but has no record. Patient is currently not on any medications for her blood pressure, even though it seems like she has been on Lisinopril 2.5 mg in the past, which seem to have been discontinued in 2019.  Her blood pressure improved to 135/72 upon recheck.   PERTINENT  PMH / PSH: T2DM  OBJECTIVE:   BP 135/72   Pulse 83   Wt 124 lb 3.2 oz (56.3 kg)   SpO2 96%   BMI 23.47 kg/m   General: Awake and Alert in NAD HEENT: Normocephalic, atraumatic. Conjunctiva normal. No nasal discharge Cardiovascular: RRR. No M/R/G Respiratory: CTAB, normal WOB on RA. No wheezing, crackles, rhonchi, or diminished breath sounds. Abdomen: Soft, non-tender, non-distended. Bowel sounds normoactive Extremities: No BLE edema, no deformities or significant joint findings. Skin: Warm and dry. Neuro: A&Ox3. No focal neurological deficits.   ASSESSMENT/PLAN:   High blood pressure associated with diabetes [facility]) Patient comes in for blood pressure recheck today and consideration of ACE inhibitor per Dr. Kennith Gain note.  However patient and son did not bring in a log of her blood pressures and it seems like the lisinopril in the past could have caused her a cough and was discontinued in 2019. - Advised patient and son to bring in a log of her blood pressures at home and check in the AM and PM - Advised patient and son to bring in her BP machine to check accuracy with ours - RTO in 2 weeks to review blood pressure log and  make any necessary medication changes w/ Dr. Raymondo Band  Diabetes [facility]) Jardiance was discontinued at her last visit due to some urinary discomfort.  Seems like her urinary symptoms are chronic.  Will not change her medication regimen at this time because we are unsure of which medications the patient has been taking at home for her diabetes. - Advised patient and son to bring in all her medications to her next visit - A1c checked today: 8.8, improved from 10.1, but now is off Jardiance - RTO in 2 weeks with Dr. Raymondo Band for med rec and optimization of her management to make any necessary changes to her diabetic regimen   [name], DO [facility]

## 2022-12-30 ENCOUNTER — Ambulatory Visit: Payer: Medicare Other | Admitting: Family Medicine

## 2022-12-30 ENCOUNTER — Other Ambulatory Visit: Payer: Self-pay

## 2022-12-30 VITALS — BP 135/72 | HR 83 | Wt 124.2 lb

## 2022-12-30 DIAGNOSIS — E118 Type 2 diabetes mellitus with unspecified complications: Secondary | ICD-10-CM | POA: Diagnosis not present

## 2022-12-30 DIAGNOSIS — I152 Hypertension secondary to endocrine disorders: Secondary | ICD-10-CM | POA: Diagnosis not present

## 2022-12-30 DIAGNOSIS — E1159 Type 2 diabetes mellitus with other circulatory complications: Secondary | ICD-10-CM | POA: Diagnosis not present

## 2022-12-30 DIAGNOSIS — Z7984 Long term (current) use of oral hypoglycemic drugs: Secondary | ICD-10-CM

## 2022-12-30 LAB — POCT GLYCOSYLATED HEMOGLOBIN (HGB A1C): HbA1c, POC (controlled diabetic range): 8.8 % — AB (ref 0.0–7.0)

## 2022-12-30 NOTE — Assessment & Plan Note (Addendum)
Patient comes in for blood pressure recheck today and consideration of ACE inhibitor per Dr. Kennith Gain note.  However patient and son did not bring in a log of her blood pressures and it seems like the lisinopril in the past could have caused her a cough and was discontinued in 2019. - Advised patient and son to bring in a log of her blood pressures at home and check in the AM and PM - Advised patient and son to bring in her BP machine to check accuracy with ours - RTO in 2 weeks to review blood pressure log and make any necessary medication changes w/ Dr. Raymondo Band

## 2022-12-30 NOTE — Assessment & Plan Note (Addendum)
Jardiance was discontinued at her last visit due to some urinary discomfort.  Seems like her urinary symptoms are chronic.  Will not change her medication regimen at this time because we are unsure of which medications the patient has been taking at home for her diabetes. - Advised patient and son to bring in all her medications to her next visit - A1c checked today: 8.8, improved from 10.1, but now is off Jardiance - RTO in 2 weeks with Dr. Raymondo Band for med rec and optimization of her management to make any necessary changes to her diabetic regimen

## 2022-12-30 NOTE — Patient Instructions (Addendum)
It was great to see you today! Thank you for choosing [facility] for your primary care. Sandra Ryan was seen for diabetes and blood pressure management.  Today we addressed: Diabetes - it is difficult to change her diabetic regimen if we are unsure which medications you are currently taking.  We rechecked an A1c today to see how your blood sugar is doing.  Blood pressure - seems like your blood pressure is elevated when you first come to the office but then improves with recheck.  It will be important to log your blood pressures at home and bring in your blood pressure cuff to verify its accuracy with ours at your next visit. We have scheduled a follow-up with Dr. Raymondo Band, to help optimize your diabetes regimen and determine if we need to start any blood pressure medication. Please bring in your blood pressure cuff and all your medications to your visit with Dr. Raymondo Band.   If you haven't already, sign up for My Chart to have easy access to your labs results, and communication with your primary care physician.  You should return to our clinic Return in about 2 weeks (around 01/13/2023) for appointment with Dr. Raymondo Band.  Please arrive 15 minutes before your appointment to ensure smooth check in process.  We appreciate your efforts in making this happen.  Thank you for allowing me to participate in your care, Sandra Curling, DO 12/30/2022, 2:46 PM PGY-1, [facility]

## 2023-01-14 ENCOUNTER — Ambulatory Visit (INDEPENDENT_AMBULATORY_CARE_PROVIDER_SITE_OTHER): Payer: Medicare Other | Admitting: Pharmacist

## 2023-01-14 ENCOUNTER — Encounter: Payer: Self-pay | Admitting: Pharmacist

## 2023-01-14 VITALS — BP 117/67 | HR 97 | Ht 61.42 in | Wt 126.6 lb

## 2023-01-14 DIAGNOSIS — E113293 Type 2 diabetes mellitus with mild nonproliferative diabetic retinopathy without macular edema, bilateral: Secondary | ICD-10-CM | POA: Diagnosis not present

## 2023-01-14 DIAGNOSIS — M545 Low back pain, unspecified: Secondary | ICD-10-CM

## 2023-01-14 DIAGNOSIS — N952 Postmenopausal atrophic vaginitis: Secondary | ICD-10-CM

## 2023-01-14 MED ORDER — TRULICITY 0.75 MG/0.5ML ~~LOC~~ SOAJ
0.7500 mg | SUBCUTANEOUS | 1 refills | Status: DC
Start: 2023-01-14 — End: 2023-02-03

## 2023-01-14 MED ORDER — DICLOFENAC SODIUM 1 % EX GEL: 4.0000 g | Freq: Four times a day (QID) | CUTANEOUS | 0 refills | Status: DC

## 2023-01-14 MED ORDER — ESTRADIOL 0.1 MG/GM VA CREA: TOPICAL_CREAM | VAGINAL | 11 refills | Status: DC

## 2023-01-14 NOTE — Progress Notes (Signed)
S:     Chief Complaint  Patient presents with   Medication Management    Type 2 Diabetes Mellitus    67 y.o. female who presents for diabetes evaluation, education, and management. Patient arrives in good spirits and presents without  any assistance. Patient is accompanied by family member, her son.   Patient was referred and last seen by Primary Care Provider, Dr. Fatima Blank, on 12/30/2022.   PMH is significant for hypertension and type 2 DM.  At last visit, patient was advised to take home blood pressure results.   Patient reports Diabetes was diagnosed in 2018.   Current diabetes medications include: metformin, glipizide  Patient reports adherence to taking all medications as prescribed.   Do you feel that your medications are working for you? yes Have you been experiencing any side effects to the medications prescribed? no Do you have any problems obtaining medications due to transportation or finances? no Insurance coverage: Medicaid and Medicare - Contacted pharmacy to clarify coverage. Informed patient and son that new/current insurance cards need to be taken to their current pharmacy.  - Spent > 20 minutes contacting pharmacy to determine prescription issue.   Patient denies hypoglycemic events.  Patient reports nocturia (nighttime urination). 3-4 times a night. Patient denies neuropathy (nerve pain). Patient denies visual changes. Patient denies self foot exams.   Patient reported dietary habits: Eats 1 meals/day.Reports consuming an inconsistent diet.  Within the past 12 months, did you worry whether your food would run out before you got money to buy more? no Within the past 12 months, did the food you bought run out, and you didn't have money to get more? no    O:   Review of Systems  All other systems reviewed and are negative.   Physical Exam Constitutional:      Appearance: Normal appearance. She is normal weight.  Pulmonary:     Effort: Pulmonary  effort is normal.  Neurological:     Mental Status: She is alert.  Psychiatric:        Mood and Affect: Mood normal.        Behavior: Behavior normal.        Thought Content: Thought content normal.        Judgment: Judgment normal.    Lab Results  Component Value Date   HGBA1C 8.8 (A) 12/30/2022   Vitals:   01/14/23 1424  BP: 117/67  Pulse: 97  SpO2: 98%    Lipid Panel     Component Value Date/Time   CHOL 219 (H) 01/15/2022 1534   TRIG 154 (H) 01/15/2022 1534   HDL 51 01/15/2022 1534   CHOLHDL 4.3 01/15/2022 1534   CHOLHDL 4.7 05/04/2015 1057   VLDL 47 (H) 05/04/2015 1057   LDLCALC 140 (H) 01/15/2022 1534   LDLDIRECT 139 (H) 01/31/2012 1634     Patient is participating in a Managed Medicaid Plan:  Yes   A/P: Diabetes longstanding currently and uncontrolled. Patient is able to verbalize appropriate hypoglycemia management plan. Medication adherence appears good. Control is suboptimal due to A1c of 8.8. -Re-Started GLP-1 Trulicity (dulaglutide)  0.75mg /0.45ml - previously used in 2021. -Continue glipizide 5mg  BID before meal. -Continued metformin 1000 mg 2 tab once daily - consider XR at next prescription fill.  -Patient educated on purpose, proper use, and potential adverse effects of Trulicity pen.  -Extensively discussed pathophysiology of diabetes, recommended lifestyle interventions, dietary effects on blood sugar control.  -Counseled on s/sx of and management of hypoglycemia.  -  Next A1c anticipated < 8.  -Consider for LIBERATE study enrollment at next visit.    Hypertension longstanding currently controlled. Blood pressure goal of <130/80 mmHg. Medication adherence appears good.   Refilled topical NSAID and estrogen cream per patient request.   Written patient instructions provided. Patient verbalized understanding of treatment plan.  Total time in face to face counseling 37 minutes.    Follow-up:  Pharmacist Dr. Raymondo Band on 01/20/2023 at 10:30 am PCP clinic  visit in Dr. Mliss Sax on 02/13/2023 at 11:00 am Patient seen with Caprice Beaver, PharmD Candidate and Shona Simpson, PharmD Candidate.

## 2023-01-14 NOTE — Patient Instructions (Signed)
It was nice to see you today!  Your goal blood sugar is 80-130 before eating and less than 180 after eating.  Medication Changes: START Trulicity 0.75mg /0.28ml inject once weekly  Continue all other medication the same.   Monitor blood sugars at home and keep a log (glucometer or piece of paper) to bring with you to your next visit.  Keep up the good work with diet and exercise. Aim for a diet full of vegetables, fruit and lean meats (chicken, [place], fish). Try to limit salt intake by eating fresh or frozen vegetables (instead of canned), rinse canned vegetables prior to cooking and do not add any additional salt to meals.   Monitor blood pressure at home daily and keep a log (on your phone or piece of paper) to bring with you to your next visit. Write down date, time, blood pressure and pulse.  Keep up the good work with diet and exercise. Aim for a diet full of vegetables, fruit and lean meats (chicken, [place], fish). Try to limit salt intake by eating fresh or frozen vegetables (instead of canned), rinse canned vegetables prior to cooking and do not add any additional salt to meals.

## 2023-01-15 NOTE — Progress Notes (Signed)
Reviewed and agree with Dr Koval's plan.   

## 2023-01-20 ENCOUNTER — Encounter: Payer: Self-pay | Admitting: Pharmacist

## 2023-01-20 ENCOUNTER — Ambulatory Visit (INDEPENDENT_AMBULATORY_CARE_PROVIDER_SITE_OTHER): Payer: Medicare Other | Admitting: Pharmacist

## 2023-01-20 VITALS — BP 139/78 | HR 99 | Wt 126.0 lb

## 2023-01-20 DIAGNOSIS — E113293 Type 2 diabetes mellitus with mild nonproliferative diabetic retinopathy without macular edema, bilateral: Secondary | ICD-10-CM

## 2023-01-20 LAB — POCT GLYCOSYLATED HEMOGLOBIN (HGB A1C): HbA1c, POC (controlled diabetic range): 8.1 % — AB (ref 0.0–7.0)

## 2023-01-20 NOTE — Progress Notes (Signed)
Reviewed and agree with Dr Koval's plan.   

## 2023-01-20 NOTE — Assessment & Plan Note (Signed)
LIBERATE Study:  -Patient provided verbal consent to participate in the study. Consent documented in electronic medical record.  -Provided education on Libre 3 CGM. Collaborated to ensure Josephine Igo 3 app was downloaded on patient's phone. Educated on how to place sensor every 14 days, patient placed first sensor correctly and verbalized understanding of use, removal, and how to place next sensor. Discussed alarms. 8 sensors provided for a 3 month supply. Educated to contact the office if the sensor falls off early and replacements are needed before their next [employer].    Diabetes longstanding currently uncontrolled. Patient and her son were able to verbalize appropriate hypoglycemia management plan. Medication adherence appears good. Control is suboptimal due to A1c of 8.1% with recent transition from empagliflozin (Jardiance) 25 mg once daily to glipizide 5 mg BID, due to dysuria.  -Continued Trulicity (dulaglutide) 0.75 mg once weekly, glipizide 5 mg BID, metformin 1000 mg BID -Patient educated on purpose, proper use, and potential adverse effects.  -Extensively discussed pathophysiology of diabetes, recommended lifestyle interventions, dietary effects on blood sugar control.  -Counseled on s/sx of and management of hypoglycemia.

## 2023-01-20 NOTE — Patient Instructions (Addendum)
????? ???????????? ??????, ????????, 11 ??  ?? ??????? ????? ???? ??????!   ?????? ???? ????????? ????? ?? ??? ???????? ????? ????? ??????? ??????   ?????? ?? ???? ????? ????????? ? ??? ?????? ??? ???? ???????? ????? ? ?????????????? ???????? ????????? ?????? ???? ??????   ?? ????????? ??????? ?????? ???? ?? ??????? ?????? ???? ? ???? ???????? ?? ??????? ????????? ???? ????? ?????? ????? ???? ??????? ?????????? ??????? ??????? ????????? ???? ??? ???????? ????????? Ark? ap?'in?am?n?a s?mab?ra, n?bh?mbara, 11 m?  ?ja tim?l?'? d?kh?ra khus? l?gy?!   S?nsara s?n? v??arapr?pha ?iska h? juna m?thill? h?tak? pach??i r?khi'?k? huncha.   Tyah?m? ?ka dh?rai p?tal? phil?m?n?a cha juna ch?l?k? sataha muni sam'milita huncha ra in?ars?i?iyala phlu'i?am? gluk?jak? m?tr? m?pana gardacha.   Y? pra??l?l? tap?'??k? cin?k? stara 14 dinasam'ma sa?kalana garcha ra yasal? praty?ka 15 min??am? gluk?jak? stara svata? r?kar?a garcha. Yasal? tap?'??k? prad?yakal?'? tap?'??k? gluk?ja starahar?m? [place] pani ?h?m?c?har? d?kh?'un?cha.  ????? ???... 1. ?????? 14 ??? ??????? 2. ?????? ???, ??????, ???, ? ???????? ?????? ????????? ???? ?????????? 3. ?????? ???? ?????: BG ???????? ?????? ???? ??? 1 ????? ????? ??   4. FreeStyle Libre 2 ?? ???? ??????? ???????? 8 ??????? ?????? ??????? ?????????? Libre 3 ??? ??????????? ???????? ???????  5. ??????? ???? ???????? 1.5 ???? ????? ???? ????? ????????? 6. ?? ????? ???? ? ?????????????? ????? ????? ???????, ????? ?????? ????? ??? ?????? ??? ??????? ??????? ?????????? 7. ??? ???????? ??????? ??????? ????? ????? ?????? ??? ?????? ????? ??????? ??????? ????????? 8. ???????? ?????? ?????? (MRI), ?????????? ?????????? (CT) ???????, ?? ???? ??????? ????????? ??? (????????) ????? ??? ?????? ??????????? 9. ?????????? ?????? ???-???? ???? ???????? ?????? ????? ??????? ?? ????? ?????? ?????????? (AIT) ??? ???????? (???????? ??? ???????? ??? ??????) ?? ??????? ????-?? ??????? ????????? ????  ????? ???? ?????, ??? ??????? ?? ?????-???? ?????-???? ? ?????? ?????????? ???? ???????????  10. ??????? ?? (????????? ????) ?? ????? > 500 ????????? ????? ??? ??? ???? ??? ??? ????? 11. ?? ????? 3 ??? ????? ??? ????? ?? 30 ????? ????? ??? ???? ???? ???????????? ???????? ?????? ????? ??????????  12. 39 ? 77 ?????? ????????? ??? ?????? ??? ?????? ?????????? ?? ?????? ????? ????? ????? ???? ???????  ?????????? ???? ???????? ??? ??????? 1. ????? Tegaderm I.V. ???????? ???? ?????????? ?????? ?????? ???? ????? ?????? 2. ???????? ????? ????? ??? ????? ???? ????? ??????? ????? ??????? ??????? ?????????? ?????? ?????????? ????? ????? ??????????? ??????? ????? ????????? ????? ?????? ?????, ????? ???????? ??????? ????? (???? ????? ???? ???????? ???? ????? ???? ????) ???? ????????? ? ????? ????????? ????? ??????? ????? ?????????? ?????? ???? ???? ?????????? Kr?pay? y?da... 1. S?nsara 14 dina ?ikn?cha 2. S?nsara d?ga, ?y???, jalana, ra ha???b??a ???h?k? k??tram? l?g? garnuparcha. 3. S?nsara suru gardai: BG ri?i?ahar? upalabdha hunu aghi 1 gha??? v?rma apa   4. FreeStyle Libre 2 k? l?gi kamtim? praty?ka 8 gha???m? s?nsara sky?na garnuh?s. Libre 3 l?'? sky?ni?ak? ?va?yakat? pardaina.  5. Sky?na garna s?nsarak? 1.5 Inca bhitra ri?ara h?l?a garnuh?s 6. Jaba ragatak? th?p? ra my?gniph?'i?ga gl?sa cinha d?khincha, upac?ra nir?aya garnu aghi aunl?l? ragata gluk?ja par?k?a?a garnuh?s. 7. Yadi s?nsarak? ri?i?al? tap?'?l?'? kast? mahasusa gardaina bhan? aunl?l? ragatak? gluk?ja par?k?a?a garnuh?s 8. [place]?ya anun?da im?ji?a (MRI), kampyu???a ??m?gr?ph? (CT) sky?na, v? ucca ?vr?tti vidyut?ya t?pa (??yatharm?) upac?ra aghi s?nsara ha??'unuh?s. 9. Phris??'ila libr? v?ka-thru m??ala ?i??k?ara m?rphata lag?'una sakincha. Y? unnata im?ji?a ??kn?l?j? (AIT) ba?? sky?nara (mil?mi?ara v?bha sky?nara pani bhanincha) v? by?g?ja ?ksa-r? m?sinak? samparkam? nahuna sakcha. Elam Dutch? sa???, h?ta ghum?'un? v? p?r?a-?ar?ra py??a-??'una ra  bhiju'ala nir?k?a?ak? l?gi s?dhnuh?s.  10. Bhi??mina s? (?skarbika ?si?a) k? khur?ka > 500 mil?gr?ma  prati dina galata ucca pa?hana huna sakcha. 11. ?ka pa?akam? 3 phi?a bhand? ba?h? ?ubna v? 30 min??a bhand? ba?h? p?n? muni nar?khnuh?s. Bist?rai suk?'una py??a garnuh?s.  12. 39 Ra 77 ?igr? ph?r?nah?'i?a b?ca s?nsara ki?a bha???ra garnuh?s. Y? t?pam?na d?yar? bhitra phrija garna sakincha.  Phr?s??'ila libara s?iki?ga sa?ga samasy?? 1. Ar?ara Tegaderm I.V. Philmahar? sidhai phris??'ila libr? s?nsara m?thi h?tam? r?khna. 2. Am?janab??a skina ?y?ka pani ar?ara garna sakcha. Alk?hala sv?ba k??tra tap?'?nl? phr?s??'ila libr? vyavasth?pana garn? y?jan? ban?'unuhuncha tyasapachi sukna dinuh?s. ?kapa?aka sukhkh? bha'?pachi, skina ?y?kal?'? g?l?k?ra gatim? (ch?l? ?y?ka bin? s?nsarak? l?gi b?cam? spa?a sahita) l?g? garnuh?s ra sukna dinuh?s. ?kapa?aka suk?pachi tap?'im? phr?s??'ila libr? l?g? garna saknuhuncha  Next Appointment on Monday, November, 11  It was a pleasure seeing you today!  The sensor is small waterproof disc that is placed on the back of the upper arm.  There is a very thin filament that is inserted under the surface of the skin and measures the amount of glucose in the interstitial fluid.  This system collects your sugar levels for up to 14 days and it automatically records the glucose level every 15 minutes. This will show your provider any patterns in your glucose levels.  Please remember... 1. Sensor will last 14 days 2. Sensor should be applied to area away from scarring, tattoos, irritation, and bones. 3. Starting the sensor: 1 hour warm up before BG readings available   4. Scan the sensor at least every 8 hours for the FreeStyle Libre 2. Libre 3 does not require scanning.  5. Hold reader within 1.5 inches of sensor to scan 6. When the blood drop and magnifying glass symbol appears, test fingerstick blood glucose prior to making treatment decisions 7. Do a fingerstick blood  glucose test if the sensor readings do not match how you feel 8. Remove sensor prior to magnetic resonance imaging (MRI), computed tomography (CT) scan, or high-frequency electrical heat (diathermy) treatment. 9. Freestyle Libre may be worn through a Industrial/product designer. It may not be exposed to an advanced Imaging Technology (AIT) body scanner (also called a millimeter wave scanner) or the baggage x-ray machine. Instead, ask for hand-wanding or full-body pat-down and visual inspection.  10. Doses of vitamin C (ascorbic acid) >500 mg every day may cause false high readings. 11. Do not submerge more than 3 feet or keep underwater longer than 30 minutes at a time. Gently pat to dry.  12. Store sensor kit between 39 and 77 degrees Farenheit. Can be refrigerated within this temperature range.  Problems with Freestyle Libre sticking? 1. Order Tegaderm I.V. films to place directly over [facility] sensor on arm. 2. May also order Skin Tac from [facility]. Alcohol swab area you plan to administer Freestyle Libre then let dry. Once dry, apply Skin Tac in a circular motion (with a spot in the middle for sensor without skin tac) and let dry. Once dry you can apply Freestyle Libre   Problems taking off Freestyle Staples? 1. Remember to try to shower before removing Freestyle Libre 2. Order Tac Away to help remove any extra adhesive left on your skin once you remove Freestyle Libre 3. May also try baby oil to loosen adhesive  Freestyle [facility] Phone number: [phone] Available 7 days a week; excluding holidays 8 AM to 8PM EST  [email or web address]  Please do the following:  Continue making the lifestyle changes we've discussed together during our visit. Diet and exercise play a significant role in improving your blood sugars.  Follow-up with me/PCP  in 2 weeks.   Hypoglycemia or low blood sugar:   Low blood sugar can happen quickly and may become an emergency if not treated right  away.   While this shouldn't happen often, it can be brought upon if you skip a meal or do not eat enough. Also, if your insulin or other diabetes medications are dosed too high, this can cause your blood sugar to go to low.   Warning signs of low blood sugar include: Feeling shaky or dizzy Feeling weak or tired  Excessive hunger Feeling anxious or upset  Sweating even when you aren't exercising  What to do if I experience low blood sugar? Follow the Rule of 15 Check your blood sugar. If lower than 70, proceed to step 2.  Treat with 15 grams of fast acting carbs which is found in 3-4 glucose tablets. If none are available you can try hard candy, 1 tablespoon of sugar or honey,4 ounces of fruit juice, or 6 ounces of REGULAR soda.  Re-check your sugar in 15 minutes. If it is still below 70, do what you did in step 2 again. If your blood sugar has come back up, go ahead and eat a snack or small meal made up of complex carbs (ex. Whole grains) and protein at this time to avoid recurrence of low blood sugar.

## 2023-01-20 NOTE — Progress Notes (Signed)
S:     Chief Complaint  Patient presents with   Medication Management    LIBERATE - Enrollment   67 y.o. female who presents for diabetes evaluation, education, and management in the context of the LIBERATE Study.   PMH is significant for T2DM, HLD, HTN.  Patient was referred and last seen by Primary Care Provider, Dr. Fatima Blank, on 12/30/2022.  At last visit with pharmacy on 01/14/2023 started Trulicity (dulaglutide) 0.75 mg once weekly.   Today, patient arrives in good spirits and presents with son, Doreatha Martin, for assistance, who acted as Passenger transport manager for appointment. Majority of visit focused on CGM study explanation, enrollment and implementation of technology for Kep'el 3 including phone app installation and sensor applicaton.   Patient reports Diabetes was diagnosed in 2018.   Current diabetes medications include: Trulicity (dulaglutide) 0.75 mg once weekly (no issues with restarting), glipizide 5 mg BID, metformin 1000 mg BID Current hypertension medications include: None Current hyperlipidemia medications include: atorvastatin 80 mg once daily  Patient's son reports adherence to taking all medications as prescribed.   O:   Review of Systems  All other systems reviewed and are negative.   Physical Exam Vitals reviewed.  Constitutional:      Appearance: Normal appearance.  Pulmonary:     Effort: Pulmonary effort is normal.  Neurological:     Mental Status: She is alert.  Psychiatric:        Mood and Affect: Mood normal.        Behavior: Behavior normal.        Thought Content: Thought content normal.        Judgment: Judgment normal.    Lab Results  Component Value Date   HGBA1C 8.1 (A) 01/20/2023   Vitals:   01/20/23 1051  BP: 139/78  Pulse: 99  SpO2: 97%    Lipid Panel     Component Value Date/Time   CHOL 219 (H) 01/15/2022 1534   TRIG 154 (H) 01/15/2022 1534   HDL 51 01/15/2022 1534   CHOLHDL 4.3 01/15/2022 1534   CHOLHDL 4.7 05/04/2015 1057    VLDL 47 (H) 05/04/2015 1057   LDLCALC 140 (H) 01/15/2022 1534   LDLDIRECT 139 (H) 01/31/2012 1634    Clinical Atherosclerotic Cardiovascular Disease (ASCVD): No  The 10-year ASCVD risk score (Arnett DK, et al., 2019) is: 17.5%   Values used to calculate the score:     Age: 62 years     Sex: Female     Is Non-Hispanic African American: No     Diabetic: Yes     Tobacco smoker: No     Systolic Blood Pressure: 139 mmHg     Is BP treated: No     HDL Cholesterol: 51 mg/dL     Total Cholesterol: 219 mg/dL   Patient is participating in a Managed Medicaid Plan:  Yes   A/P:  LIBERATE Study:  -Patient provided verbal consent to participate in the study. Consent documented in electronic medical record.  -Provided education on Libre 3 CGM. Collaborated to ensure Josephine Igo 3 app was downloaded on patient's phone. Educated on how to place sensor every 14 days, patient placed first sensor correctly and verbalized understanding of use, removal, and how to place next sensor. Discussed alarms. 8 sensors provided for a 3 month supply. Educated to contact the office if the sensor falls off early and replacements are needed before their next [employer].    Diabetes longstanding currently uncontrolled. Patient and her son were  able to verbalize appropriate hypoglycemia management plan. Medication adherence appears good. Control is suboptimal due to A1c of 8.1% with recent transition from empagliflozin (Jardiance) 25 mg once daily to glipizide 5 mg BID, due to dysuria.  -Continued Trulicity (dulaglutide) 0.75 mg once weekly, glipizide 5 mg BID, metformin 1000 mg BID -Patient educated on purpose, proper use, and potential adverse effects.  -Extensively discussed pathophysiology of diabetes, recommended lifestyle interventions, dietary effects on blood sugar control.  -Counseled on s/sx of and management of hypoglycemia.    Written patient instructions provided. Patient verbalized understanding of  treatment plan.  Total time in face to face counseling 49 minutes.    Follow-up:  Pharmacist 02/03/2023 for LIBERATE 2 week follow-up. PCP clinic visit in PRN.  Patient seen with Shona Simpson, PharmD Candidate.

## 2023-01-29 ENCOUNTER — Other Ambulatory Visit: Payer: Self-pay

## 2023-02-03 ENCOUNTER — Encounter: Payer: Self-pay | Admitting: Pharmacist

## 2023-02-03 ENCOUNTER — Ambulatory Visit (INDEPENDENT_AMBULATORY_CARE_PROVIDER_SITE_OTHER): Payer: Medicare Other | Admitting: Pharmacist

## 2023-02-03 VITALS — BP 139/72 | HR 85 | Wt 127.8 lb

## 2023-02-03 DIAGNOSIS — E113293 Type 2 diabetes mellitus with mild nonproliferative diabetic retinopathy without macular edema, bilateral: Secondary | ICD-10-CM | POA: Diagnosis not present

## 2023-02-03 MED ORDER — GLIPIZIDE 5 MG PO TABS: 5.0000 mg | ORAL_TABLET | Freq: Every day | ORAL | Status: DC

## 2023-02-03 MED ORDER — TRULICITY 1.5 MG/0.5ML ~~LOC~~ SOAJ: 1.5000 mg | SUBCUTANEOUS | 1 refills | Status: DC

## 2023-02-03 NOTE — Patient Instructions (Addendum)
?? ??????? ????? ?????? ??????!  ??????? ?????? ????? ???? ???? ???   80-130 ? ???? ??? 180 ????? ?? ??????  ???? ???????????:  ?????????? (????????????) ??????? ?? ??? ?.?? ????????? ??? ?.? ????????? ???? ?????????? ?? ??? ??????? ????? ????? ?.?? ????????? ??? ?????? ?????????  ?????????? 5 ????????? ?? ???????????? ????? ??? ??? ??????????  ???? ??? ??????? ???? ????? ???? ???????????   ???? ? ??????? ??? ?????? ??? ???? ??????????? ??????, ????? ? ????? ???? (??????, ?????, ????) ?? ?????? ?????? ???? ?????? ??????????? ???? ?? ????? ????????? (????????? ?????) ???? ????? ?????? ????? ????? ?????? ?????????, ???? ?????? ??? ????????? ????????? ?????? ????????? ? ?????? ???? ???????? ??? ?????????? ?ja tim?l?'? d?kh?ra r?mr? l?gy?!  Tap?'??k? lak?ya ragatam? cin? kh?nu aghi 80-130 ra kh?n? pachi 180 bhand? kama huncha.  Au?adhi parivartanahar?:  ?rulisi?? (?ul?glu??'i?a) hapt?m? ?ka pa?aka 0.75 Mil?gr?ma b??a 1.5 Mil?gr?ma sam'ma ba?h?'unuh?s ?ka pa?aka tap?'?nl? h?tam? rah?k? 0.75 Mil?gr?ma kalama sam?pta garnubhay?.  Glipij?'i?a 5 mil?gr?ma ?ka ?y?bl??ab??a dainika du'? pa?aka gha??'unuh?s  an'ya sabai au?adhihar? sam?na r?pam? j?r? r?khnuh?s.   ?h?ra ra vy?y?ma sa?ga r?mr? k?ma j?r? r?khnuh?s. Tarak?r?, phalaph?la ra dubal? m?su (kukhur?, ?ark?, m?ch?) l? bhari'?k? ?h?rak? l?gi lak?ya r?khnuh?s. T?j? v? jam?k? tarak?r?har? (ky?n?ak? sa???) kh?'?ra nunak? m?tr? s?mita garn? pray?sa garnuh?s, kh?n? pak?'unu aghi ?ibb?banda tarak?r?har? kull? garnuh?s ra kh?n?m? [place] atirikta nuna thapnuh?s.  ??????? ??????????? ?????? ?????? ?? ????? ?????? ?????????, ? ???? ????????? ???????? Tap?'?nl? kh?'irahanubha'?k? c?malak? m?tr? kama garn? pray?sa garnuh?s, ra adhika tarak?r?har? kh?nuh?s  It was nice to see you today!  Your goal blood sugar is 80-130 before eating and less than 180 after eating.  Medication Changes:  Increase Trulicity (dulaglutide) from 0.75 mg once weekly to 1.5 mg  once weekly once you finish the 0.75 mg pens that you have on hand  Decrease glipizide 5 mg from one tablet twice daily to one tablet once daily  Continue all other medication the same.   Keep up the good work with diet and exercise. Aim for a diet full of vegetables, fruit and lean meats (chicken, [place], fish). Try to limit salt intake by eating fresh or frozen vegetables (instead of canned), rinse canned vegetables prior to cooking and do not add any additional salt to meals.   Consider trying to reduce amount of rice you are eating, and eat more vegetables

## 2023-02-03 NOTE — Assessment & Plan Note (Signed)
Diabetes longstanding currently controlled with variability higher than desired (>36%). Patient's son is able to verbalize appropriate hypoglycemia management plan. Medication adherence appears good. Control is suboptimal due to variability of 40.3%. -Increased Trulicity (dulaglutide) from 0.75 mg once weekly to 1.5 mg once weekly once patient finishes 2 pens of 0.75 mg on hand -Decreased glipizide 5 mg from one tablet BID to one tablet once daily to try to reduce variability -Continued metformin 1000 mg BID -Patient educated on purpose, proper use, and potential adverse effects of increased Trulicity dose.  -Discussed eating snacks between meals to avoid lows, and what fruits are acceptable snacks for her -Extensively discussed pathophysiology of diabetes, recommended lifestyle interventions, dietary effects on blood sugar control.  -Counseled on s/sx of and management of hypoglycemia.

## 2023-02-03 NOTE — Progress Notes (Signed)
S:     Chief Complaint  Patient presents with   Medication Management    T2DM F/U; LIBERATE 2 week check-in   67 y.o. female who presents for diabetes evaluation, education, and management. Patient arrives in fair spirits and presents without any assistance. Patient is accompanied by her son, Doreatha Martin, who acted as Passenger transport manager for this encounter. Patient complained of right sided headaches that worry her, due to her daughter's history of stroke.   Patient was last seen by Primary Care Provider, Dr. Fatima Blank, on 12/30/2022.  At last pharmacy visit on 01/20/2023 enrolled patient in LIBERATE study and continued all medications  PMH is significant for T2DM, HTN, HLD.   Patient reports Diabetes was diagnosed in 2018.   Current diabetes medications include: glipizide 5 mg BID, Trulicity (dulaglutide) 0.75 mg once weekly, metformin 1000 mg BID Current hypertension medications include: None Current hyperlipidemia medications include: atorvastatin 80 mg once daily  Patient and her son report adherence to taking all medications as prescribed.   Do you feel that your medications are working for you? yes Have you been experiencing any side effects to the medications prescribed? no Insurance coverage: Medicare & [place] Medicaid  Patient reports hypoglycemic events, but denies symptoms of hypoglycemia  Patient reported dietary habits: Eats 2 meals/day - Patient is a vegetarian and eats a lot of rice, minimal protein  Breakfast: rice, lentil soup, greens Snacks: bananas, apples, pineapple Drinks: tea without sugar  O:   Review of Systems  Musculoskeletal:  Positive for joint pain. Back pain: Right Arm Pain. Neurological:  Positive for headaches (Right Sided).  All other systems reviewed and are negative.   Physical Exam Vitals reviewed.  Constitutional:      Appearance: Normal appearance.  Pulmonary:     Effort: Pulmonary effort is normal.  Neurological:     Mental Status: She is  alert.  Psychiatric:        Behavior: Behavior normal.    7 day average blood glucose: 147  Libre3 CGM Download today on 02/03/2023 % Time CGM is active: 53% Average Glucose: 147 mg/dL Glucose Management Indicator: 6.8  Glucose Variability: 40.3% (goal <36%) Time in Goal:  - Time in range 70-180: 66% - Time above range: 30% - Time below range: 4%   Lab Results  Component Value Date   HGBA1C 8.1 (A) 01/20/2023   Vitals:   02/03/23 1121 02/03/23 1125  BP: (!) 141/73 139/72  Pulse: 85   SpO2: 100%     Lipid Panel     Component Value Date/Time   CHOL 219 (H) 01/15/2022 1534   TRIG 154 (H) 01/15/2022 1534   HDL 51 01/15/2022 1534   CHOLHDL 4.3 01/15/2022 1534   CHOLHDL 4.7 05/04/2015 1057   VLDL 47 (H) 05/04/2015 1057   LDLCALC 140 (H) 01/15/2022 1534   LDLDIRECT 139 (H) 01/31/2012 1634    Clinical Atherosclerotic Cardiovascular Disease (ASCVD): No  The 10-year ASCVD risk score (Arnett DK, et al., 2019) is: 17.5%   Values used to calculate the score:     Age: 5 years     Sex: Female     Is Non-Hispanic African American: No     Diabetic: Yes     Tobacco smoker: No     Systolic Blood Pressure: 139 mmHg     Is BP treated: No     HDL Cholesterol: 51 mg/dL     Total Cholesterol: 219 mg/dL   Patient is participating in a Managed Medicaid Plan:  Yes   A/P: Diabetes longstanding currently controlled with variability higher than desired (>36%). Patient's son is able to verbalize appropriate hypoglycemia management plan. Medication adherence appears good. Control is suboptimal due to variability of 40.3%. -Increased Trulicity (dulaglutide) from 0.75 mg once weekly to 1.5 mg once weekly once patient finishes 2 pens of 0.75 mg on hand -Decreased glipizide 5 mg from one tablet BID to one tablet once daily to try to reduce variability -Continued metformin 1000 mg BID -Patient educated on purpose, proper use, and potential adverse effects of increased Trulicity dose.   -Discussed eating snacks between meals to avoid lows, and what fruits are acceptable snacks for her -Extensively discussed pathophysiology of diabetes, recommended lifestyle interventions, dietary effects on blood sugar control.  -Counseled on s/sx of and management of hypoglycemia.   ASCVD risk - primary prevention in patient with diabetes. Last LDL is 140 (2023) not at goal of <70 mg/dL. ASCVD risk factors include T2DM, HLD, HTN  -Continued atorvastatin 80 mg once daily  Hypertension longstanding currently uncontrolled. Blood pressure goal of <130/80 mmHg. Blood pressure control is suboptimal due to no BP medications.  History or tolerating Lisinopril in distant past. -No BP medications started today -Reevaluate and consider ARB at next visit.   Written patient instructions provided. Patient verbalized understanding of treatment plan.  Total time in face to face counseling 43 minutes.    Follow-up:  Pharmacist 03/10/2023 PCP clinic visit in 02/13/2023 w/ Dr. Mliss Sax Patient seen with Lendon Ka, PharmD Candidate and Shona Simpson, PharmD Candidate.

## 2023-02-05 NOTE — Progress Notes (Signed)
Reviewed and agree with Dr Koval's plan.   

## 2023-02-13 ENCOUNTER — Ambulatory Visit: Payer: Medicare Other | Admitting: Family Medicine

## 2023-02-13 NOTE — Progress Notes (Deleted)
    SUBJECTIVE:   CHIEF COMPLAINT / HPI:  Patient is accompanied by *** today. Visit also aided by in-person Nepali interpreter.  Follow up and monitoring of the following issues:  T2DM Unable to check sugars at home as patient has not been able to pick up glucose monitor from pharmacy/unsure if insurance covers device and supplies.  *** FOOT EXAM  HTN Checking BP at home:  Healthcare maintenance Needs colonoscopy Vaccines (Tdap, zoster, covid) Labs Foot exam Eye exam? DEXA screening   PERTINENT  PMH / PSH: ***  OBJECTIVE:   There were no vitals taken for this visit.  ***  ASSESSMENT/PLAN:   No problem-specific Assessment & Plan notes found for this encounter.     Cyndia Skeeters, DO [facility]

## 2023-02-24 ENCOUNTER — Ambulatory Visit (INDEPENDENT_AMBULATORY_CARE_PROVIDER_SITE_OTHER): Payer: Medicare Other | Admitting: Family Medicine

## 2023-02-24 ENCOUNTER — Encounter: Payer: Self-pay | Admitting: Family Medicine

## 2023-02-24 VITALS — BP 129/77 | HR 96 | Ht 61.0 in | Wt 129.0 lb

## 2023-02-24 DIAGNOSIS — H1013 Acute atopic conjunctivitis, bilateral: Secondary | ICD-10-CM | POA: Diagnosis not present

## 2023-02-24 DIAGNOSIS — E113293 Type 2 diabetes mellitus with mild nonproliferative diabetic retinopathy without macular edema, bilateral: Secondary | ICD-10-CM

## 2023-02-24 DIAGNOSIS — E1159 Type 2 diabetes mellitus with other circulatory complications: Secondary | ICD-10-CM | POA: Diagnosis not present

## 2023-02-24 DIAGNOSIS — Z0100 Encounter for examination of eyes and vision without abnormal findings: Secondary | ICD-10-CM | POA: Diagnosis not present

## 2023-02-24 DIAGNOSIS — E119 Type 2 diabetes mellitus without complications: Secondary | ICD-10-CM | POA: Diagnosis not present

## 2023-02-24 DIAGNOSIS — I152 Hypertension secondary to endocrine disorders: Secondary | ICD-10-CM | POA: Diagnosis not present

## 2023-02-24 DIAGNOSIS — R21 Rash and other nonspecific skin eruption: Secondary | ICD-10-CM | POA: Diagnosis not present

## 2023-02-24 DIAGNOSIS — E118 Type 2 diabetes mellitus with unspecified complications: Secondary | ICD-10-CM

## 2023-02-24 DIAGNOSIS — Z1211 Encounter for screening for malignant neoplasm of colon: Secondary | ICD-10-CM

## 2023-02-24 MED ORDER — METFORMIN HCL 1000 MG PO TABS: ORAL_TABLET | ORAL | 3 refills | Status: DC

## 2023-02-24 MED ORDER — OLOPATADINE HCL 0.2 % OP SOLN: 1.0000 [drp] | Freq: Every day | OPHTHALMIC | 3 refills | Status: DC

## 2023-02-24 NOTE — Assessment & Plan Note (Signed)
Goal <130/80. Pt is at goal 129/77 today. No BP medications currently. - continue to monitor - consider ARB if BP is uncontrolled in future

## 2023-02-24 NOTE — Progress Notes (Signed)
    SUBJECTIVE:   CHIEF COMPLAINT / HPI:  Pt presents with son who acts as interpreter. Declines virtual interpreter services.  HTN Denies HA, dizziness, weakness, vision changes. Does check BP at home but does not have a log of these numbers. BP today is 129/77.   Diabetes Last A1c 8.8 on 10/7. Seen by Dr. Raymondo Band 11/11 and medications were adjusted, however pt and son are unsure of exactly what adjustments were made. Pt is using Libre 3 CGM and notes sugars in the 300s overnight; initially reports sugars of 100-200s during the day, but during our appointment her alarm goes off and notifies of sugar >300.   Itching, rash  On arms and legs. No hives, swelling, redness. No known allergies or skin irritants. No new soaps, detergents, etc.    PERTINENT  PMH / PSH:  Past Medical History:  Diagnosis Date   Abnormal Pap smear    Cystocele    Diabetes mellitus without complication (HCC)    GERD (gastroesophageal reflux disease)    Hemorrhoid    Sleep apnea    Tuberculosis    took medication for 4 months     OBJECTIVE:   BP 129/77   Pulse 96   Ht 5\' 1"  (1.549 m)   Wt 129 lb (58.5 kg)   SpO2 99%   BMI 24.37 kg/m   General: Well-appearing, no acute distress. Cardio: Regular rate, regular rhythm, no murmurs on exam. Pulm: Clear, no wheezing, no crackles. No increased work of breathing. Abdominal: bowel sounds present, soft, non-tender, non-distended. Extremities: no peripheral edema. Moves all extremities equally. Skin: bilateral UE and LE without erythema, swelling, hives. Present on all four limbs is dry, somewhat flaky skin with minor excoriations.   ASSESSMENT/PLAN:   High blood pressure associated with diabetes (HCC) Goal <130/80. Pt is at goal 129/77 today. No BP medications currently. - continue to monitor - consider ARB if BP is uncontrolled in future  Diabetes (HCC) Pt should be taking Trulicity 1.5mg  weekly, glipizide 5mg  daily, and metformin 1000mg  BID per last  visit with Dr. Raymondo Band. - teaching provided and written instructions reviewed with pt and son on dosages as above; continue medications as above - Due for A1c check in Jan 2025 - due for diabetic eye exam; referral placed at pt request - follow up with Dr. Raymondo Band 12/16 as scheduled  Rash No lesions or erythema, but with some skin flaking present this appears to be dry skin. - may use any OTC skin lotion to protect and hydrate skin   Patient is also due for colonoscopy. Referral placed today.  Cyndia Skeeters, DO [facility]

## 2023-02-24 NOTE — Assessment & Plan Note (Addendum)
Pt should be taking Trulicity 1.5mg  weekly, glipizide 5mg  daily, and metformin 1000mg  BID per last visit with Dr. Raymondo Band. - teaching provided and written instructions reviewed with pt and son on dosages as above; continue medications as above - Due for A1c check in Jan 2025 - due for diabetic eye exam; referral placed at pt request - follow up with Dr. Raymondo Band 12/16 as scheduled

## 2023-02-24 NOTE — Patient Instructions (Signed)
Dear York Grice  Today we discussed the following concerns and plans:  Diabetes: -Trulicity (dulaglutide) 1.5 mg once weekly -Glipizide 5 mg from one tablet once daily -Metformin 1000 mg 2 times daily  Prescriptions I am sending to your pharmacy: - I refilled your metformin - Pataday eye drops  I have referred you for colonoscopy; the stomach doctors should call you to schedule this. I have also referred you to the eye doctors to have your eyes checked.  If you have any concerns, please call the clinic or schedule an appointment.  It was a pleasure to take care of you today. Be well!  Cyndia Skeeters, DO [facility], PGY-1

## 2023-02-24 NOTE — Assessment & Plan Note (Signed)
No lesions or erythema, but with some skin flaking present this appears to be dry skin. - may use any OTC skin lotion to protect and hydrate skin

## 2023-02-27 ENCOUNTER — Telehealth: Payer: Self-pay | Admitting: *Deleted

## 2023-02-27 NOTE — Progress Notes (Signed)
  Care Coordination Note  02/27/2023 Name: Sandra Ryan MRN: [id number] DOB: 21-Oct-1955  Sandra Ryan is a 67 y.o. year old female who is a primary care patient of Cyndia Skeeters, DO and is actively engaged with the care management team. I reached out to [facility] by phone today to assist with scheduling a follow up visit with the RN Case Manager  Follow up plan: Unsuccessful telephone outreach attempt made. A HIPAA compliant phone message was left for the patient providing contact information and requesting a return call.   Burman Nieves, CCMA Care Coordination Care Guide Direct Dial: [phone]

## 2023-03-10 ENCOUNTER — Encounter: Payer: Self-pay | Admitting: Pharmacist

## 2023-03-10 ENCOUNTER — Ambulatory Visit: Payer: Medicare Other | Admitting: Pharmacist

## 2023-03-10 VITALS — BP 144/85 | HR 86 | Wt 128.0 lb

## 2023-03-10 DIAGNOSIS — E113293 Type 2 diabetes mellitus with mild nonproliferative diabetic retinopathy without macular edema, bilateral: Secondary | ICD-10-CM | POA: Diagnosis not present

## 2023-03-10 DIAGNOSIS — Z794 Long term (current) use of insulin: Secondary | ICD-10-CM | POA: Diagnosis not present

## 2023-03-10 DIAGNOSIS — E118 Type 2 diabetes mellitus with unspecified complications: Secondary | ICD-10-CM

## 2023-03-10 MED ORDER — EMPAGLIFLOZIN 25 MG PO TABS: 25.0000 mg | ORAL_TABLET | Freq: Every day | ORAL | 6 refills | Status: DC

## 2023-03-10 MED ORDER — GLIPIZIDE 5 MG PO TABS: 10.0000 mg | ORAL_TABLET | Freq: Every day | ORAL | 3 refills | Status: DC

## 2023-03-10 NOTE — Progress Notes (Signed)
    S:     Chief Complaint  Patient presents with   Medication Management    Diabetes - CGM    67 y.o. female who presents for diabetes evaluation, education, and management. Patient arrives in fair spirits and presents without any assistance. Patient is accompanied by her son, Doreatha Martin, who acted as Passenger transport manager for this encounter.   Patient was referred and last seen by Primary Care Provider, Dr. Mliss Sax, on 02/04/2023.   PMH is significant for T2DM, HTN, HLD At last visit, Trulicity (dulaglutide) use was focus of visit and attempt to restart/use.   Patient reports Diabetes was diagnosed in 2018.   Current diabetes medications include: glipizide 5 mg daily, metformin 1000 mg BID   Patient reports adherence to taking all medications as prescribed with exception of Trulicity (dulaglutide).   Do you feel that your medications are working for you? no  Patient reports hypoglycemic events intermittently with symptoms however no "low" readings on CGM.  Patient remains reluctant to consider use of insulin "injection" therapy at this time.   O:   Review of Systems  Gastrointestinal:  Positive for abdominal pain (while taking Trulicity).  Neurological:  Positive for headaches (Associated with high and lower glucose readings.).    Physical Exam Vitals reviewed.  Constitutional:      Appearance: Normal appearance.  Pulmonary:     Effort: Pulmonary effort is normal.  Neurological:     Mental Status: She is alert. Mental status is at baseline.  Psychiatric:        Mood and Affect: Mood normal.     Libre3 CGM Download today 03/10/2023  % Time CGM is active: 96% Average Glucose: 242 mg/dL Glucose Management Indicator: 9.1  Glucose Variability: 32.5% (goal <36%) Time in Goal:  - Time in range 70-180: 27% (previously 66%) - Time above range: 73% - Time below range: 0% Observed patterns:   Lab Results  Component Value Date   HGBA1C 8.1 (A) 01/20/2023   Vitals:    03/10/23 1129  BP: (!) 161/89  Pulse: 86  SpO2: 97%    Lipid Panel     Component Value Date/Time   CHOL 219 (H) 01/15/2022 1534   TRIG 154 (H) 01/15/2022 1534   HDL 51 01/15/2022 1534   CHOLHDL 4.3 01/15/2022 1534   CHOLHDL 4.7 05/04/2015 1057   VLDL 47 (H) 05/04/2015 1057   LDLCALC 140 (H) 01/15/2022 1534   LDLDIRECT 139 (H) 01/31/2012 1634     Patient is participating in a Managed Medicaid Plan:  Yes   A/P: Diabetes longstanding currently worsened control since discontinuation of Trulicity (dulaglutide) due to GI side effects. Patient's son is able to verbalize appropriate hypoglycemia management plan. Medication adherence appears good. -Increased glipizide from 5 mg to 20mg  once daily reduce variability and nocturnal hypoglycemic risk.  -Continued metformin 1000 mg BID -Restarted Jardiance (empagliflozin) 25mg  which patient had taken with success in the past.  -Patient educated on purpose, proper use, and potential adverse effects of Jardiance.   Written patient instructions provided. Patient verbalized understanding of treatment plan.  Total time in face to face counseling 24 minutes.    Follow-up:  Pharmacist PRN PCP clinic visit next with Dr. Mliss Sax in ~ 1 month.

## 2023-03-10 NOTE — Patient Instructions (Addendum)
?? ??????? ????? ?????? ??????!  ??????? ?????? ????? ???? ???? ???   80-130 ? ???? ??? 180 ????? ?? ??????  ???? ???????????: START - Jardiance (empagliflozin) 25mg  ????? ?? ???  ????????????? ????? ?? ????????? (??? ?????????) ?? ?????????? - ??? ????? ?????????  ?????????? ???? ????????? (????? ??????? ????? ???? ??? ????? ?????? ???? ???????????)  ???? ??? ??????? ???? ????? ???? ??????????? ????????? 1000mg  ????? TWICE  ???? ????? ?????? ?????? ??????? ????????? ? ????? ??????? ??????? ?????? ?? (?????????? ?? ?????? ??????) ???????????  ???? ? ??????? ??? ?????? ??? ???? ??????????? ??????, ????? ? ????? ???? (??????, ?????, ????) ?? ?????? ?????? ???? ?????? ??????????? ???? ?? ????? ????????? (????????? ?????) ???? ????? ?????? ????? ????? ?????? ?????????, ???? ?????? ??? ????????? ????????? ?????? ????????? ? ?????? ???? ???????? ??? ?????????? ?ja tim?l?'? d?kh?ra r?mr? l?gy?!  Tap?'??k? lak?ya ragatam? cin? kh?nu aghi 80-130 ra kh?n? pachi 180 bhand? kama huncha.  Au?adhi parivartanahar?: START - Jardiance (empagliflozin) 25mg  dainika ?ka pa?aka  glipij?'i?al?'? bih?na 10 mil?gr?ma (du'? ?y?bl??a) m? ba?h?'unuh?s - ?kai samayam? linuh?s.  ?rulisi?? banda garnuh?s (ark? bhrama?am? b?m?k? [place] pani kh?r?ja garnak? l?gi ly?'unuh?s)  an'ya sabai au?adhihar? sam?na r?pam? j?r? r?khnuh?s. M??apharmina 1000mg  dainika TWICE  gharamai ragatam? cin?k? m?tr? nigar?n? garnuh?s ra ark? bhrama?am? ?ph?sam?gai ly?'una laga (gluk?mi?ara v? k?gajak? ?ukr?) r?khnuh?s.  ?h?ra ra vy?y?ma sa?ga r?mr? k?ma j?r? r?khnuh?s. Tarak?r?, phalaph?la ra dubal? m?su (kukhur?, ?ark?, m?ch?) l? bhari'?k? ?h?rak? l?gi lak?ya r?khnuh?s. T?j? v? jam?k? tarak?r?har? (ky?n?ak? sa???) kh?'?ra nunak? m?tr? s?mita garn? pray?sa garnuh?s, kh?n? pak?'unu aghi ?ibb?banda tarak?r?har? kull? garnuh?s ra kh?n?m? kunai atirikta nuna thapnuh?s.    It was nice to see you today!  Your goal blood sugar is 80-130 before  eating and less than 180 after eating.  Medication Changes: START - Jardiance (empagliflozin) 25mg  once daily  Increase Glipizide to 10mg  (two tablets) in the morning - Take at the same time.  Discontinue Trulicity  (bring any remaining to the next visit for [place] to discard)  Continue all other medication the same. Metformin 1000mg  TWICE Daily  Monitor blood sugars at home and keep a log (glucometer or piece of paper) to bring with you to your next visit.  Keep up the good work with diet and exercise. Aim for a diet full of vegetables, fruit and lean meats (chicken, [place], fish). Try to limit salt intake by eating fresh or frozen vegetables (instead of canned), rinse canned vegetables prior to cooking and do not add any additional salt to meals.

## 2023-03-10 NOTE — Assessment & Plan Note (Signed)
Diabetes longstanding currently worsened control since discontinuation of Trulicity (dulaglutide) due to GI side effects. Patient's son is able to verbalize appropriate hypoglycemia management plan. Medication adherence appears good. -Increased glipizide from 5 mg to 20mg  once daily reduce variability and nocturnal hypoglycemic risk.  -Continued metformin 1000 mg BID -Restarted Jardiance (empagliflozin) 25mg  which patient had taken with success in the past.  -Patient educated on purpose, proper use, and potential adverse effects of Jardiance.

## 2023-03-11 NOTE — Progress Notes (Signed)
Reviewed and agree with Dr Koval's plan.   

## 2023-03-11 NOTE — Telephone Encounter (Signed)
  Care Coordination Note  03/11/2023 Name: Sandra Ryan MRN: [id number] DOB: 1956/02/08  Sandra Ryan is a 67 y.o. year old female who is a primary care patient of Cyndia Skeeters, DO and is actively engaged with the care management team. I reached out to [facility] by phone today to assist with scheduling a follow up visit with the RN Case Manager  Follow up plan: We have been unable to make contact with the patient for follow up.   Burman Nieves, CCMA Care Coordination Care Guide Direct Dial: [phone]

## 2023-03-20 ENCOUNTER — Other Ambulatory Visit: Payer: Self-pay | Admitting: Family Medicine

## 2023-03-20 DIAGNOSIS — K219 Gastro-esophageal reflux disease without esophagitis: Secondary | ICD-10-CM

## 2023-04-14 ENCOUNTER — Encounter: Payer: Medicare Other | Admitting: Pharmacist

## 2023-04-14 ENCOUNTER — Encounter: Payer: Self-pay | Admitting: Pharmacist

## 2023-04-14 VITALS — BP 130/73 | HR 89 | Wt 122.0 lb

## 2023-04-14 DIAGNOSIS — M545 Low back pain, unspecified: Secondary | ICD-10-CM

## 2023-04-14 DIAGNOSIS — E113293 Type 2 diabetes mellitus with mild nonproliferative diabetic retinopathy without macular edema, bilateral: Secondary | ICD-10-CM

## 2023-04-14 DIAGNOSIS — G8929 Other chronic pain: Secondary | ICD-10-CM

## 2023-04-14 LAB — POCT GLYCOSYLATED HEMOGLOBIN (HGB A1C): HbA1c, POC (controlled diabetic range): 9.7 % — AB (ref 0.0–7.0)

## 2023-04-14 MED ORDER — DICLOFENAC SODIUM 1 % EX GEL: 4.0000 g | Freq: Four times a day (QID) | CUTANEOUS | 0 refills | Status: DC

## 2023-04-14 NOTE — Assessment & Plan Note (Signed)
Diabetes longstanding currently uncontrolled - worsened control over first 3 months of LIberatee Study - apprehensive and unwilling to use insulin therapy at visit today. Patient is able to verbalize appropriate hypoglycemia management plan. Medication adherence appears good. Control is suboptimal due to variability of 30.2%. -Continued SGLT2-I Jardiance (empagliflozin) 25 mg daily. -Continued metformin 1000 mg BID.  -Continued glipizide 10 mg daily - in AM Reluctant to use CGM as she believes it is related to her arthraliga/myalgias - scheduled for work-in with PCP later this week.

## 2023-04-14 NOTE — Patient Instructions (Signed)
It was nice to see you today!  Your goal blood sugar is 80-130 before eating and less than 180 after eating.  Medication Changes: Continue all other medication the same.   Keep up the good work with diet and exercise. Aim for a diet full of vegetables, fruit and lean meats (chicken, [place], fish). Try to limit salt intake by eating fresh or frozen vegetables (instead of canned), rinse canned vegetables prior to cooking and do not add any additional salt to meals.

## 2023-04-14 NOTE — Research (Signed)
S:     Chief Complaint  Patient presents with   Medication Management    Libre 3 Month visit   68 y.o. female who presents for diabetes evaluation, education, and management. Patient arrives in good spirits and presents without any assistance. Patient is accompanied by her son, Doreatha Martin, who acted as Passenger transport manager for this encounter. Patient complained with neurological pain.   Patient was referred and last seen by Primary Care Provider, Dr. Mliss Sax, on 02/24/2023.   PMH is significant for diabetes.  Current diabetes medications include: Jardiance (empagliflozin) 25 mg, glipizide 5 mg 2 tablets daily, metformin 1000 mg BID.   Current hyperlipidemia medications include: atorvastatin 80 mg daily.   Patient reports adherence to taking all medications as prescribed.   Insurance coverage: Medicare  Patient denies hypoglycemic events. Patient reports nocturia (nighttime urination).  Patient reports neuropathy (nerve pain). Patient reports visual changes.   O:   Review of Systems  Constitutional:  Positive for malaise/fatigue.  Eyes:  Positive for blurred vision.  Musculoskeletal:  Positive for joint pain.  Neurological:  Positive for tingling.  All other systems reviewed and are negative.   Physical Exam Constitutional:      Appearance: Normal appearance. She is normal weight.  Pulmonary:     Effort: Pulmonary effort is normal.  Neurological:     Mental Status: She is alert.  Psychiatric:        Mood and Affect: Mood normal.        Behavior: Behavior normal.     Libre3 CGM Download today 03/06/2023 % Time CGM is active: 94% Average Glucose: 237 mg/dL Glucose Management Indicator: 9.0  Glucose Variability: 30.2% (goal <36%) Time in Goal:  - Time in range 70-180: 30% - Time above range: 70% - Time below range: 0% Observed patterns: Mostly above range, elevated during meal time   Lab Results  Component Value Date   HGBA1C 9.7 (A) 04/14/2023   Vitals:    04/14/23 1119  BP: 130/73  Pulse: 89  SpO2: 99%    Lipid Panel     Component Value Date/Time   CHOL 219 (H) 01/15/2022 1534   TRIG 154 (H) 01/15/2022 1534   HDL 51 01/15/2022 1534   CHOLHDL 4.3 01/15/2022 1534   CHOLHDL 4.7 05/04/2015 1057   VLDL 47 (H) 05/04/2015 1057   LDLCALC 140 (H) 01/15/2022 1534   LDLDIRECT 139 (H) 01/31/2012 1634    Clinical Atherosclerotic Cardiovascular Disease (ASCVD): Yes  The 10-year ASCVD risk score (Arnett DK, et al., 2019) is: 17.1%   Values used to calculate the score:     Age: 43 years     Sex: Female     Is Non-Hispanic African American: No     Diabetic: Yes     Tobacco smoker: No     Systolic Blood Pressure: 130 mmHg     Is BP treated: No     HDL Cholesterol: 51 mg/dL     Total Cholesterol: 219 mg/dL   Patient is participating in a Managed Medicaid Plan:  Yes   A/P: Diabetes longstanding currently uncontrolled - worsened control over first 3 months of LIberatee Study - apprehensive and unwilling to use insulin therapy at visit today. Patient is able to verbalize appropriate hypoglycemia management plan. Medication adherence appears good. Control is suboptimal due to variability of 30.2%. -Continued SGLT2-I Jardiance (empagliflozin) 25 mg daily. -Continued metformin 1000 mg BID.  -Continued glipizide 10 mg daily - in AM Reluctant to use CGM as she  believes it is related to her arthralgia/myalgias - scheduled for work-in with PCP later this week.   Aches/Pain increased over the last several weeks.   Requested diclofenac gel refill.  Provided.  Reconsider use of CGM at PCP visit - 1 sensor on-hand at home today.   Additional 3 months of sensors available for her use during 3 additional months.   Written patient instructions provided. Patient verbalized understanding of treatment plan.  Total time in face to face counseling 37 minutes.    Follow-up:  Pharmacist PRN PCP clinic visit in 04/17/2023 with Dr. Mliss Sax Patient seen with  Lavona Mound, PharmD Candidate.

## 2023-04-14 NOTE — Progress Notes (Signed)
Reviewed

## 2023-04-17 ENCOUNTER — Ambulatory Visit: Payer: Medicare Other | Admitting: Family Medicine

## 2023-04-17 VITALS — BP 149/85 | HR 97 | Temp 97.9°F | Wt 126.2 lb

## 2023-04-17 DIAGNOSIS — E118 Type 2 diabetes mellitus with unspecified complications: Secondary | ICD-10-CM

## 2023-04-17 DIAGNOSIS — K649 Unspecified hemorrhoids: Secondary | ICD-10-CM

## 2023-04-17 DIAGNOSIS — E113293 Type 2 diabetes mellitus with mild nonproliferative diabetic retinopathy without macular edema, bilateral: Secondary | ICD-10-CM

## 2023-04-17 DIAGNOSIS — M791 Myalgia, unspecified site: Secondary | ICD-10-CM

## 2023-04-17 MED ORDER — GLIPIZIDE 5 MG PO TABS: 10.0000 mg | ORAL_TABLET | Freq: Two times a day (BID) | ORAL | 3 refills | Status: AC

## 2023-04-17 NOTE — Assessment & Plan Note (Signed)
Uncontrolled.  Most recent A1c 9.7 up from 8.1.  She is not taking all of her medications due to misunderstanding at prior appointment.  Stressed to patient that she needs to take all of her medicines every day in order to get her sugar under control. - Continue Jardiance 25 mg daily, metformin 1000 mg twice daily - Restart glipizide at new dose: 10 mg twice daily -Return to clinic for follow-up and A1c recheck in 3 months

## 2023-04-17 NOTE — Assessment & Plan Note (Signed)
Strongly suspect this is related to patient's uncontrolled DM.  Strongly encouraged the patient to take her diabetes medicine as prescribed above in order to hopefully reverse and improve the symptoms. - May also use Voltaren gel, Tylenol as needed for symptom relief - Follow-up sooner if worsening

## 2023-04-17 NOTE — Patient Instructions (Addendum)
Dear Sandra Ryan  Today we discussed the following concerns and plans:  Diabetes Take these medicines: - Jardiance 25mg  once a day - Metformin 1000mg  2 times a day (morning and night) - Glipizipe 10mg  2 times a day (morning and night)  I have also referred you to the GI doctors to discuss your hemorrhoids. They should call you to make an appointment.   If you have any concerns, please call the clinic or schedule an appointment.  It was a pleasure to take care of you today. Be well!  Cyndia Skeeters, DO [facility], PGY-1

## 2023-04-17 NOTE — Assessment & Plan Note (Signed)
Patient declined rectal exam today, but given her description strongly suspect she has internal hemorrhoids. -GI referral for consideration of hemorrhoid banding per patient request

## 2023-04-17 NOTE — Progress Notes (Signed)
    SUBJECTIVE:  Patient is accompanied by in person interpreter today who aids in this encounter.  CHIEF COMPLAINT / HPI:   Muscle aches all over New onset in the last few months.  Pain is constant, aching in her muscles and her joints.  She believes it is related to her recent use of continuous glucose monitor.  Sometimes keeps her up at night.  Has used Voltaren gel, Tylenol with mild relief.  She has not had any recent illnesses, no fevers.  No joint swelling or stiffness.  DM A1c 8.1 > 9.7.  She has seen Dr. Raymondo Band; she is reluctant to use insulin, continuous glucose monitor.  She is not monitoring her sugars. She is currently taking Jardiance 25 mg, metformin 1000 mg twice daily.  She is not taking glipizide, and states that she was told to stop taking this.  Hemorrhoids She describes feeling something "pop out" when she has a bowel movement.  She uses her finger to replace.  Scant blood in toilet after bowel movement.  No pain.  PERTINENT  PMH / PSH:  T2DM HTN GERD  OBJECTIVE:   BP (!) 149/85   Pulse 97   Temp 97.9 F (36.6 C)   Wt 126 lb 3.2 oz (57.2 kg)   SpO2 98%   BMI 23.85 kg/m   General: Well-appearing, no acute distress. HEENT: normocephalic, PERRLA, EOM grossly intact. Cardio: Regular rate, regular rhythm, no murmurs on exam. Pulm: No increased work of breathing. Extremities: no peripheral edema. Moves all extremities equally. No rectal exam today due to patient declined   ASSESSMENT/PLAN:   Diabetes (HCC) Uncontrolled.  Most recent A1c 9.7 up from 8.1.  She is not taking all of her medications due to misunderstanding at prior appointment.  Stressed to patient that she needs to take all of her medicines every day in order to get her sugar under control. - Continue Jardiance 25 mg daily, metformin 1000 mg twice daily - Restart glipizide at new dose: 10 mg twice daily -Return to clinic for follow-up and A1c recheck in 3 months  Generalized muscle  ache Strongly suspect this is related to patient's uncontrolled DM.  Strongly encouraged the patient to take her diabetes medicine as prescribed above in order to hopefully reverse and improve the symptoms. - May also use Voltaren gel, Tylenol as needed for symptom relief - Follow-up sooner if worsening  Hemorrhoid Patient declined rectal exam today, but given her description strongly suspect she has internal hemorrhoids. -GI referral for consideration of hemorrhoid banding per patient request     Cyndia Skeeters, DO [facility]

## 2023-06-16 LAB — HM DIABETES EYE EXAM

## 2023-07-07 ENCOUNTER — Ambulatory Visit: Admitting: Family Medicine

## 2023-07-07 NOTE — Progress Notes (Deleted)
    SUBJECTIVE:   CHIEF COMPLAINT / HPI:   T2DM Has been taking all medications as prescribed: Jardiance 25 mg daily, metformin 1000 mg twice daily, glipizide 10 mg twice daily ***. No issues. Blood sugars at home average *** She is not checking her sugars at home.  Most recent A1c 9.7. Today ***  PERTINENT  PMH / PSH: Reviewed. T2DM HTN GERD  OBJECTIVE:   There were no vitals taken for this visit.  *** General: ***-appearing, no acute distress. HEENT: normocephalic, PERRLA, MMM, bilateral TM visualized without erythema or bulging. Cardio: Regular rate, *** rhythm, no murmurs on exam. Pulm: Clear, no wheezing, no crackles. No increased work of breathing. Abdominal: bowel sounds present, soft, non-tender, non-distended. Extremities: no peripheral edema. Moves all extremities equally. Neuro: Alert and oriented x3, speech normal in content, no facial asymmetry, strength intact and equal bilaterally in UE and LE, pupils equal and reactive to light.  Psych:  Cognition and judgment appear intact. Alert, communicative, and cooperative with normal attention span and concentration. No apparent delusions, illusions, hallucinations    ASSESSMENT/PLAN:   Assessment & Plan      Omar Bibber, DO [facility]

## 2023-07-18 ENCOUNTER — Ambulatory Visit: Admitting: Family Medicine

## 2023-07-18 ENCOUNTER — Encounter: Payer: Self-pay | Admitting: Family Medicine

## 2023-07-18 VITALS — BP 110/60 | HR 100 | Ht 61.0 in | Wt 123.5 lb

## 2023-07-18 DIAGNOSIS — E113293 Type 2 diabetes mellitus with mild nonproliferative diabetic retinopathy without macular edema, bilateral: Secondary | ICD-10-CM

## 2023-07-18 LAB — POCT GLYCOSYLATED HEMOGLOBIN (HGB A1C): HbA1c, POC (controlled diabetic range): 9.3 % — AB (ref 0.0–7.0)

## 2023-07-18 NOTE — Patient Instructions (Signed)
 Dear Sandra Ryan  Today we discussed the following concerns and plans:  Your A1c is better!  This is good news. Continue taking your diabetes medicines: - Jardiance  25 mg 1 time a day - Metformin  1000 mg 2 times a day - Glipizide  10 mg 2 times a day - Follow-up to recheck your A1c in 3 months (August 2025)  If you have any concerns, please call the clinic or schedule an appointment.  It was a pleasure to take care of you today. Be well!  Omar Bibber, DO [facility], PGY-1

## 2023-07-18 NOTE — Assessment & Plan Note (Addendum)
 Achieving better control, today's A1c downtrending to 9.3.  No changes needed at this time. - Continue taking Jardiance  25 mg daily, metformin  1000 mg twice daily, glipizide  10 mg twice daily - Due for diabetic kidney eval with e GFR and urine ACR, pt declines today and would prefer to do this next visit. -Follow-up in 3 months to recheck A1c.

## 2023-07-18 NOTE — Progress Notes (Signed)
    SUBJECTIVE:   CHIEF COMPLAINT / HPI:  Accompanied by son who interprets.  Diabetes Most recent A1c 9.7, due for recheck today. She is taking all of her medications-Jardiance  25 mg daily, metformin  1000 mg twice daily, glipizide  10 mg twice daily She has not been checking her sugars at home.  PERTINENT  PMH / PSH:  T2DM HTN GERD  OBJECTIVE:   BP 110/60   Pulse 100   Ht 5\' 1"  (1.549 m)   Wt 123 lb 8 oz (56 kg)   SpO2 100%   BMI 23.34 kg/m   General: well-appearing, no acute distress. HEENT: normocephalic, EOM grossly intact, MMM. Cardio: Regular rate, Giller rhythm, no murmurs on exam. Pulm: No increased work of breathing. Abdominal: soft, non-tender, non-distended.   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  No changes needed at this time. - Continue taking Jardiance  25 mg daily, metformin  1000 mg twice daily, glipizide  10 mg twice daily - Due for diabetic kidney eval with e GFR and urine ACR, pt declines today and would prefer to do this next visit. -Follow-up in 3 months to recheck A1c.     Omar Bibber, DO [facility]

## 2023-07-21 ENCOUNTER — Other Ambulatory Visit: Payer: Self-pay

## 2023-07-21 DIAGNOSIS — K219 Gastro-esophageal reflux disease without esophagitis: Secondary | ICD-10-CM

## 2023-07-21 MED ORDER — OMEPRAZOLE 20 MG PO CPDR: 20.0000 mg | DELAYED_RELEASE_CAPSULE | Freq: Every day | ORAL | 3 refills | Status: DC

## 2023-07-24 ENCOUNTER — Encounter: Payer: Self-pay | Admitting: Family Medicine

## 2023-07-24 ENCOUNTER — Ambulatory Visit (INDEPENDENT_AMBULATORY_CARE_PROVIDER_SITE_OTHER): Payer: Self-pay | Admitting: Family Medicine

## 2023-07-24 VITALS — BP 143/76 | HR 91 | Ht 61.0 in | Wt 123.4 lb

## 2023-07-24 DIAGNOSIS — Z7984 Long term (current) use of oral hypoglycemic drugs: Secondary | ICD-10-CM | POA: Diagnosis not present

## 2023-07-24 DIAGNOSIS — E113293 Type 2 diabetes mellitus with mild nonproliferative diabetic retinopathy without macular edema, bilateral: Secondary | ICD-10-CM | POA: Diagnosis not present

## 2023-07-24 DIAGNOSIS — K649 Unspecified hemorrhoids: Secondary | ICD-10-CM | POA: Diagnosis present

## 2023-07-24 NOTE — Assessment & Plan Note (Signed)
 Patient declined rectal exam today, however given her description strongly suspect she has hemorrhoids. -Due for colonoscopy, she has been previously referred for this; again provided phone number for [facility] for son to schedule -Advised patient that GI will also be able to help with hemorrhoid management - Educated patient on avoiding constipation to keep from worsening hemorrhoids; it sounds like she is doing well with this currently

## 2023-07-24 NOTE — Assessment & Plan Note (Signed)
-   BMP, urine obtained today; will call patient with any abnormal results - Due for diabetic foot exam; patient declines today and wishes to do this at next visit

## 2023-07-24 NOTE — Progress Notes (Signed)
    SUBJECTIVE:   CHIEF COMPLAINT / HPI:  Accompanied by son who acts as interpreter.   Diabetes Needs BMP and urine today, declined these at last visit.  Hemorrhoids Every time she has a BM she feels something "come out" and she has to push it back in. Sometimes a burning sensation. Occasional bleeding. Regular BM daily. Denies straining, hard stool. Reports she had these "years ago" and had them "cut out."  PERTINENT  PMH / PSH:  T2DM HTN GERD  OBJECTIVE:   BP (!) 143/76   Pulse 91   Ht 5\' 1"  (1.549 m)   Wt 123 lb 6.4 oz (56 kg)   SpO2 99%   BMI 23.32 kg/m    General: Well-appearing, no acute distress. Cardio: RRR Pulm: No increased work of breathing. Abdominal: bowel sounds present, soft, non-tender, non-distended. Declined rectal exam today.   ASSESSMENT/PLAN:   Assessment & Plan Hemorrhoids, unspecified hemorrhoid type Patient declined rectal exam today, however given her description strongly suspect she has hemorrhoids. 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

## 2023-07-24 NOTE — Patient Instructions (Signed)
 Dear Sandra Ryan  Today we discussed the following concerns and plans:  Hemorrhoids - please call [facility] at [phone] to schedule your colonoscopy. They ca also hep you with your hemorrhoids.  You had labs done today to check on your diabetes. I will call you if the results are abnormal.   If you have any concerns, please call the clinic or schedule an appointment.  It was a pleasure to take care of you today. Be well!  Omar Bibber, DO [facility], PGY-1

## 2023-07-25 ENCOUNTER — Encounter: Payer: Self-pay | Admitting: Physician Assistant

## 2023-07-28 LAB — BASIC METABOLIC PANEL WITH GFR
BUN/Creatinine Ratio: 12 (ref 12–28)
BUN: 11 mg/dL (ref 8–27)
CO2: 21 mmol/L (ref 20–29)
Calcium: 9.7 mg/dL (ref 8.7–10.3)
Chloride: 100 mmol/L (ref 96–106)
Creatinine, Ser: 0.89 mg/dL (ref 0.57–1.00)
Glucose: 282 mg/dL — ABNORMAL HIGH (ref 70–99)
Potassium: 4.9 mmol/L (ref 3.5–5.2)
Sodium: 138 mmol/L (ref 134–144)
eGFR: 70 mL/min/{1.73_m2} (ref >59)

## 2023-07-28 LAB — MICROALBUMIN / CREATININE URINE RATIO
Creatinine, Urine: 14.3 mg/dL
Microalb/Creat Ratio: <21 mg/g{creat} (ref 0–29)
Microalbumin, Urine: <3 ug/mL

## 2023-09-16 ENCOUNTER — Ambulatory Visit: Admitting: Physician Assistant

## 2023-09-16 NOTE — Progress Notes (Unsigned)
 Sandra Console, PA-C [address] [place], [place]  [address] Phone: [phone]   Gastroenterology Consultation  Referring Provider:     Lafe Domino, DO Primary Care Physician:  Lafe Domino, DO Primary Gastroenterologist:  Sandra Console, PA-C / Sandra Kiang, MD  Reason for Consultation:     Hemorrhoids, Hx Colon Polyps        HPI:   Sandra Ryan is a 67 y.o. y/o female referred for consultation & management  by Lafe Domino, DO.  Patient is here with her son.  We are using a Nepali language interpreter through [employer] today [name]).  GI symptoms: Patient saw her PCP 07/24/2023 to evaluate hemorrhoids.  She reports feeling a painful nodule protruding from the rectum every time she had a bowel movement.  She had to push it back in.  She had a burning sensation with mild occasional bright red rectal bleeding.  Denies having constipation, straining, or hard stool.  Normal BM daily.  NO recent CBC since 2019.  Patient states she is not using any treatment for hemorrhoids.  She denies abdominal pain or weight loss.  She has had some low back pain.  03/2012 colonoscopy by Dr. Teressa showed 2 small (2 to 3 mm) sessile serrated adenoma polyps removed, small internal hemorrhoids, otherwise normal.  Good prep.  Overdue for repeat Colonoscopy.  PMH: Hemorrhoids, GERD, diabetes, diabetic retinopathy, peripheral neuropathy, OA, history of TB.  Past Medical History:  Diagnosis Date   Abnormal Pap smear    Cystocele    Diabetes mellitus without complication (HCC)    GERD (gastroesophageal reflux disease)    Hemorrhoid    Sleep apnea    Tuberculosis    took medication for 4 months    Past Surgical History:  Procedure Laterality Date   no signficant history      Prior to Admission medications   Medication Sig Start Date End Date Taking? Authorizing Provider  Accu-Chek Softclix Lancets lancets Use as instructed. Use ONCE daily.  Dx Code: E11.9 01/15/22   Macario Dorothyann HERO, MD   acetaminophen  (TYLENOL ) 500 MG tablet Take 1 tablet (500 mg total) by mouth every 6 (six) hours as needed. 03/09/21   Donzetta Rollene BRAVO, MD  aspirin  81 MG EC tablet Take 1 tablet (81 mg total) by mouth daily. Swallow whole. Patient not taking: Reported on 07/18/2023 12/10/17   Lanny Maxwell, MD  atorvastatin  (LIPITOR) 80 MG tablet TAKE 1 TABLET(80 MG) BY MOUTH DAILY 07/29/22   Macario Dorothyann HERO, MD  Blood Glucose Monitoring Suppl (ACCU-CHEK AVIVA PLUS) w/Device KIT 1 Device by Does not apply route in the morning and at bedtime. Patient not taking: Reported on 02/24/2023 12/16/22   Lafe Domino, DO  diclofenac  Sodium (VOLTAREN ) 1 % GEL Apply 4 g topically 4 (four) times daily. 04/14/23   McDiarmid, Krystal BIRCH, MD  empagliflozin  (JARDIANCE ) 25 MG TABS tablet Take 1 tablet (25 mg total) by mouth daily. TAKE 1 TABLET(25 MG) BY MOUTH DAILY BEFORE BREAKFAST 03/10/23   McDiarmid, Krystal BIRCH, MD  estradiol  (ESTRACE ) 0.1 MG/GM vaginal cream Place 0.5g nightly for two weeks then twice a week after Patient not taking: Reported on 04/14/2023 01/14/23   McDiarmid, Krystal BIRCH, MD  glipiZIDE  (GLUCOTROL ) 5 MG tablet Take 2 tablets (10 mg total) by mouth 2 (two) times daily before a meal. 04/17/23   Lafe Domino, DO  glucose blood (ACCU-CHEK GUIDE) test strip Use ONCE daily. E11.9 Patient not taking: Reported on 07/02/2022 06/25/22  Macario Dorothyann HERO, MD  Insulin  Pen Needle 32G X 4 MM MISC Please use to inject Lantus  daily. Patient not taking: Reported on 07/02/2022 01/04/21   Scarlet Elsie LABOR, MD  Lancets Misc. (ACCU-CHEK SOFTCLIX LANCET DEV) KIT Use with lancets Patient not taking: Reported on 07/02/2022 06/25/22   Macario Dorothyann HERO, MD  meloxicam  (MOBIC ) 7.5 MG tablet Take 1 tablet (7.5 mg total) by mouth daily as needed for pain. Use sparingly. Patient not taking: Reported on 04/14/2023 08/26/22   Macario Dorothyann HERO, MD  metFORMIN  (GLUCOPHAGE ) 1000 MG tablet TAKE 1 TABLET(1000 MG) BY MOUTH TWICE DAILY WITH A MEAL 02/24/23   Lafe Domino, DO   Olopatadine  HCl 0.2 % SOLN Apply 1-2 drops to eye daily. Patient not taking: Reported on 04/14/2023 02/24/23   Lafe Domino, DO  omeprazole  (PRILOSEC) 20 MG capsule Take 1 capsule (20 mg total) by mouth daily. 07/21/23   Lafe Domino, DO    Family History  Problem Relation Age of Onset   Asthma Father    Heart disease Father    Breast cancer Neg Hx      Social History   Tobacco Use   Smoking status: Former   Smokeless tobacco: Never   Tobacco comments:    occasional use  Vaping Use   Vaping status: Never Used  Substance Use Topics   Alcohol use: No   Drug use: No    Allergies as of 09/17/2023   (No Known Allergies)    Review of Systems:    All systems reviewed and negative except where noted in HPI.   Physical Exam:  BP 132/74 (BP Location: Right Arm, Patient Position: Sitting, Cuff Size: Normal)   Pulse 94   Ht 5' 1 (1.549 m)   Wt 121 lb (54.9 kg)   BMI 22.86 kg/m  No LMP recorded. Patient is postmenopausal.  General:   Alert,  Well-developed, well-nourished, pleasant and cooperative in NAD Lungs:  Respirations even and unlabored.  Clear throughout to auscultation.   No wheezes, crackles, or rhonchi. No acute distress. Heart:  Regular rate and rhythm; no murmurs, clicks, rubs, or gallops. Abdomen:  Normal bowel sounds.  No bruits.  Soft, and non-distended without masses, hepatosplenomegaly or hernias noted.  No Tenderness.  No guarding or rebound tenderness.    Rectum: 1 Small Pea-Sized swollen hemorrhoid protruding from the rectum, reducible and mildly tender.  No gross blood.  Hemoccult Negative.  No internal rectal masses or fissures. Neurologic:  Alert and oriented x3;  grossly normal neurologically. Psych:  Alert and cooperative. Normal mood and affect.  Imaging Studies: No results found.  Labs: CBC    Component Value Date/Time   WBC 7.1 09/17/2023 1636   RBC 4.28 09/17/2023 1636   HGB 11.6 (L) 09/17/2023 1636   HGB 12.3 10/01/2017 1131   HCT 35.9  (L) 09/17/2023 1636   HCT 38.2 10/01/2017 1131   PLT 323.0 09/17/2023 1636   PLT 333 10/01/2017 1131   MCV 84.0 09/17/2023 1636   MCV 88 10/01/2017 1131   MCH 28.3 10/01/2017 1131   MCH 28.9 03/12/2016 1621   MCHC 32.2 09/17/2023 1636   RDW 14.9 09/17/2023 1636   RDW 14.3 10/01/2017 1131   LYMPHSABS 2.4 09/17/2023 1636   MONOABS 0.6 09/17/2023 1636   EOSABS 0.1 09/17/2023 1636   BASOSABS 0.1 09/17/2023 1636    CMP     Component Value Date/Time   NA 138 07/24/2023 1428   K 4.9 07/24/2023 1428   CL 100  07/24/2023 1428   CO2 21 07/24/2023 1428   GLUCOSE 282 (H) 07/24/2023 1428   GLUCOSE 147 (A) 12/16/2022 1542   BUN 11 07/24/2023 1428   CREATININE 0.89 07/24/2023 1428   CREATININE 0.91 03/12/2016 1621   CALCIUM  9.7 07/24/2023 1428   PROT 7.8 03/12/2016 1621   ALBUMIN 4.4 03/12/2016 1621   AST 19 03/12/2016 1621   ALT 18 03/12/2016 1621   ALKPHOS 50 03/12/2016 1621   BILITOT 0.3 03/12/2016 1621   GFRNONAA 83 02/01/2020 1529   GFRNONAA 68 03/12/2016 1621   GFRAA 95 02/01/2020 1529   GFRAA 79 03/12/2016 1621    Assessment and Plan:   Markelle Newhouse is a 68 y.o. y/o female has been referred for:  1.  Hemorrhoids -1 small swollen nonthrombosed hemorrhoid at the rectum. - Rx hydrocortisone 2.5% cream apply 3 times daily as needed hemorrhoid swelling for up to 2 weeks, 30 g, 1 refill. - Patient education discussed about hemorrhoids. - Stressed importance of treating underlying constipation.  2.  Rectal bleeding - Lab: CBC - Scheduling colonoscopy  3  History of adenomatous colon polyps - Scheduling Colonoscopy I discussed risks of colonoscopy with patient to include risk of bleeding, colon perforation, and risk of sedation.  Patient expressed understanding and agrees to proceed with colonoscopy.   Follow up Based on Colonoscopy Results and GI symptoms.  Sandra Console, PA-C

## 2023-09-17 ENCOUNTER — Ambulatory Visit: Admitting: Physician Assistant

## 2023-09-17 ENCOUNTER — Encounter: Payer: Self-pay | Admitting: Physician Assistant

## 2023-09-17 ENCOUNTER — Other Ambulatory Visit (INDEPENDENT_AMBULATORY_CARE_PROVIDER_SITE_OTHER)

## 2023-09-17 VITALS — BP 132/74 | HR 94 | Ht 61.0 in | Wt 121.0 lb

## 2023-09-17 DIAGNOSIS — Z860100 Personal history of colon polyps, unspecified: Secondary | ICD-10-CM | POA: Diagnosis not present

## 2023-09-17 DIAGNOSIS — K649 Unspecified hemorrhoids: Secondary | ICD-10-CM

## 2023-09-17 DIAGNOSIS — K625 Hemorrhage of anus and rectum: Secondary | ICD-10-CM

## 2023-09-17 DIAGNOSIS — Z860101 Personal history of adenomatous and serrated colon polyps: Secondary | ICD-10-CM | POA: Diagnosis not present

## 2023-09-17 LAB — CBC WITH DIFFERENTIAL/PLATELET
Basophils Absolute: 0.1 10*3/uL (ref 0.0–0.1)
Basophils Relative: 0.7 % (ref 0.0–3.0)
Eosinophils Absolute: 0.1 10*3/uL (ref 0.0–0.7)
Eosinophils Relative: 1.8 % (ref 0.0–5.0)
HCT: 35.9 % — ABNORMAL LOW (ref 36.0–46.0)
Hemoglobin: 11.6 g/dL — ABNORMAL LOW (ref 12.0–15.0)
Lymphocytes Relative: 34.1 % (ref 12.0–46.0)
Lymphs Abs: 2.4 10*3/uL (ref 0.7–4.0)
MCHC: 32.2 g/dL (ref 30.0–36.0)
MCV: 84 fl (ref 78.0–100.0)
Monocytes Absolute: 0.6 10*3/uL (ref 0.1–1.0)
Monocytes Relative: 8.3 % (ref 3.0–12.0)
Neutro Abs: 3.9 10*3/uL (ref 1.4–7.7)
Neutrophils Relative %: 55.1 % (ref 43.0–77.0)
Platelets: 323 10*3/uL (ref 150.0–400.0)
RBC: 4.28 Mil/uL (ref 3.87–5.11)
RDW: 14.9 % (ref 11.5–15.5)
WBC: 7.1 10*3/uL (ref 4.0–10.5)

## 2023-09-17 MED ORDER — HYDROCORTISONE (PERIANAL) 2.5 % EX CREA: 1.0000 | TOPICAL_CREAM | Freq: Two times a day (BID) | CUTANEOUS | 1 refills | Status: DC

## 2023-09-17 MED ORDER — NA SULFATE-K SULFATE-MG SULF 17.5-3.13-1.6 GM/177ML PO SOLN: 1.0000 | Freq: Once | ORAL | 0 refills | Status: AC

## 2023-09-17 NOTE — Patient Instructions (Addendum)
 Your provider has requested that you go to the basement level for lab work before leaving today. Press B on the elevator. The lab is located at the first door on the left as you exit the elevator.  We have sent the following medications to your pharmacy for you to pick up at your convenience: Hydrocortisone 2.5% cream apply 3 times daily for two weeks for hemorrhoids.   Please follow up as needed if symptoms increase or worsen  Due to recent changes in healthcare laws, you may see the results of your imaging and laboratory studies on MyChart before your provider has had a chance to review them.  We understand that in some cases there may be results that are confusing or concerning to you. Not all laboratory results come back in the same time frame and the provider may be waiting for multiple results in order to interpret others.  Please give us  48 hours in order for your provider to thoroughly review all the results before contacting the office for clarification of your results.   Thank you for trusting me with your gastrointestinal care!   Ellouise Console, PA-C _______________________________________________________  If your blood pressure at your visit was 140/90 or greater, please contact your primary care physician to follow up on this.  _______________________________________________________  If you are age 69 or older, your body mass index should be between 23-30. Your Body mass index is 22.86 kg/m. If this is out of the aforementioned range listed, please consider follow up with your Primary Care Provider.  If you are age 69 or younger, your body mass index should be between 19-25. Your Body mass index is 22.86 kg/m. If this is out of the aformentioned range listed, please consider follow up with your Primary Care Provider.   ________________________________________________________  The [facility] providers would like to encourage you to use MYCHART to communicate with providers for  non-urgent requests or questions.  Due to long hold times on the telephone, sending your provider a message by [facility] may be a faster and more efficient way to get a response.  Please allow 48 business hours for a response.  Please remember that this is for non-urgent requests.  _______________________________________________________

## 2023-09-18 ENCOUNTER — Encounter: Payer: Self-pay | Admitting: Physician Assistant

## 2023-09-18 NOTE — Progress Notes (Signed)
 Noted

## 2023-09-19 ENCOUNTER — Ambulatory Visit: Payer: Self-pay | Admitting: Physician Assistant

## 2023-09-19 ENCOUNTER — Telehealth: Payer: Self-pay

## 2023-09-19 ENCOUNTER — Other Ambulatory Visit (INDEPENDENT_AMBULATORY_CARE_PROVIDER_SITE_OTHER)

## 2023-09-19 DIAGNOSIS — D649 Anemia, unspecified: Secondary | ICD-10-CM | POA: Diagnosis not present

## 2023-09-19 LAB — VITAMIN B12: Vitamin B-12: 122 pg/mL — ABNORMAL LOW (ref 211–911)

## 2023-09-19 LAB — IBC + FERRITIN
Ferritin: 7 ng/mL — ABNORMAL LOW (ref 10.0–291.0)
Iron: 37 ug/dL — ABNORMAL LOW (ref 42–145)
Saturation Ratios: 8.3 % — ABNORMAL LOW (ref 20.0–50.0)
TIBC: 448 ug/dL (ref 250.0–450.0)
Transferrin: 320 mg/dL (ref 212.0–360.0)

## 2023-09-19 LAB — FOLATE: Folate: 22.1 ng/mL (ref >5.9)

## 2023-09-19 NOTE — Progress Notes (Signed)
 Patient needs interpreter (Napali).  Call and notify patient labs show: Mild low hemoglobin 11.6.  Mild anemia. Schedule another lab appointment to check iron panel, ferritin, vitamin B12, and folate.  Diagnosis anemia unspecified. Continue with plan for colonoscopy as scheduled. Ellouise Console, PA-C

## 2023-09-19 NOTE — Telephone Encounter (Signed)
 I called the patients son and he did not answer. I left a voicemail informing him that his Mothers updated procedure time is 2:00 pm. He was asked to give me a call back with any questions comments of concerns.

## 2023-09-21 ENCOUNTER — Ambulatory Visit: Payer: Self-pay | Admitting: Physician Assistant

## 2023-10-14 ENCOUNTER — Other Ambulatory Visit: Payer: Self-pay | Admitting: Family Medicine

## 2023-10-14 DIAGNOSIS — E118 Type 2 diabetes mellitus with unspecified complications: Secondary | ICD-10-CM

## 2023-10-28 ENCOUNTER — Telehealth: Payer: Self-pay | Admitting: Gastroenterology

## 2023-10-28 NOTE — Telephone Encounter (Signed)
 Inbound call from patient's son requesting a call to be directed further on prep instructions for patient procedure tomorrow. States he is unsure how to proceed. Please advise, thank you

## 2023-10-28 NOTE — Telephone Encounter (Signed)
 Returned call and spoke with patient's son.  Patient had eaten breakfast this morning.  Advised him that she should not eat anything else today.  Reviewed instructions with son and answered all questions.

## 2023-10-29 ENCOUNTER — Encounter: Payer: Self-pay | Admitting: Gastroenterology

## 2023-10-29 ENCOUNTER — Ambulatory Visit: Admitting: Gastroenterology

## 2023-10-29 VITALS — BP 136/73 | HR 69 | Resp 10

## 2023-10-29 DIAGNOSIS — D509 Iron deficiency anemia, unspecified: Secondary | ICD-10-CM | POA: Diagnosis not present

## 2023-10-29 DIAGNOSIS — K5710 Diverticulosis of small intestine without perforation or abscess without bleeding: Secondary | ICD-10-CM

## 2023-10-29 DIAGNOSIS — K642 Third degree hemorrhoids: Secondary | ICD-10-CM | POA: Diagnosis not present

## 2023-10-29 DIAGNOSIS — K2940 Chronic atrophic gastritis without bleeding: Secondary | ICD-10-CM | POA: Diagnosis not present

## 2023-10-29 DIAGNOSIS — K5730 Diverticulosis of large intestine without perforation or abscess without bleeding: Secondary | ICD-10-CM | POA: Diagnosis not present

## 2023-10-29 DIAGNOSIS — K2950 Unspecified chronic gastritis without bleeding: Secondary | ICD-10-CM | POA: Diagnosis not present

## 2023-10-29 DIAGNOSIS — K625 Hemorrhage of anus and rectum: Secondary | ICD-10-CM

## 2023-10-29 DIAGNOSIS — D649 Anemia, unspecified: Secondary | ICD-10-CM

## 2023-10-29 MED ORDER — SODIUM CHLORIDE 0.9 % IV SOLN: 500.0000 mL | Freq: Once | INTRAVENOUS | Status: DC

## 2023-10-29 NOTE — Progress Notes (Signed)
 Called to room to assist during endoscopic procedure.  Patient ID and intended procedure confirmed with present staff. Received instructions for my participation in the procedure from the performing physician.

## 2023-10-29 NOTE — Patient Instructions (Signed)
 Please read handouts provided. Continue present medications. Await pathology results. Resume previous diet.   YOU HAD AN ENDOSCOPIC PROCEDURE TODAY AT THE [facility]:   Refer to the procedure report that was given to you for any specific questions about what was found during the examination.  If the procedure report does not answer your questions, please call your gastroenterologist to clarify.  If you requested that your care partner not be given the details of your procedure findings, then the procedure report has been included in a sealed envelope for you to review at your convenience later.  YOU SHOULD EXPECT: Some feelings of bloating in the abdomen. Passage of more gas than usual.  Walking can help get rid of the air that was put into your GI tract during the procedure and reduce the bloating. If you had a lower endoscopy (such as a colonoscopy or flexible sigmoidoscopy) you may notice spotting of blood in your stool or on the toilet paper. If you underwent a bowel prep for your procedure, you may not have a normal bowel movement for a few days.  Please Note:  You might notice some irritation and congestion in your nose or some drainage.  This is from the oxygen used during your procedure.  There is no need for concern and it should clear up in a day or so.  SYMPTOMS TO REPORT IMMEDIATELY:  Following lower endoscopy (colonoscopy or flexible sigmoidoscopy):  Excessive amounts of blood in the stool  Significant tenderness or worsening of abdominal pains  Swelling of the abdomen that is new, acute  Fever of 100F or higher  Following upper endoscopy (EGD)  Vomiting of blood or coffee ground material  New chest pain or pain under the shoulder blades  Painful or persistently difficult swallowing  New shortness of breath  Fever of 100F or higher  Black, tarry-looking stools  For urgent or emergent issues, a gastroenterologist can be reached at any hour by calling [phone]  [phone]. Do not use MyChart messaging for urgent concerns.    DIET:  We do recommend a small meal at first, but then you may proceed to your regular diet.  Drink plenty of fluids but you should avoid alcoholic beverages for 24 hours.  ACTIVITY:  You should plan to take it easy for the rest of today and you should NOT DRIVE or use heavy machinery until tomorrow (because of the sedation medicines used during the test).    FOLLOW UP: Our staff will call the number listed on your records the next business day following your procedure.  We will call around 7:15- 8:00 am to check on you and address any questions or concerns that you may have regarding the information given to you following your procedure. If we do not reach you, we will leave a message.     If any biopsies were taken you will be contacted by phone or by letter within the next 1-3 weeks.  Please call us at [phone] if you have not heard about the biopsies in 3 weeks.    SIGNATURES/CONFIDENTIALITY: You and/or your care partner have signed paperwork which will be entered into your electronic medical record.  These signatures attest to the fact that that the information above on your After Visit Summary has been reviewed and is understood.  Full responsibility of the confidentiality of this discharge information lies with you and/or your care-partner.

## 2023-10-29 NOTE — Op Note (Signed)
 [facility] Patient Name: Sandra Ryan Procedure Date: 10/29/2023 2:57 PM MRN: [id number] Endoscopist: Glendia E. Stacia , MD, [id number] Age: 68 Referring MD:  Date of Birth: [birth year] Gender: Female Account #: [id number] Procedure:                Colonoscopy Indications:              Iron deficiency anemia Medicines:                Monitored Anesthesia Care Procedure:                Pre-Anesthesia Assessment:                           - Prior to the procedure, a History and Physical                            was performed, and patient medications and                            allergies were reviewed. The patient's tolerance of                            previous anesthesia was also reviewed. The risks                            and benefits of the procedure and the sedation                            options and risks were discussed with the patient.                            All questions were answered, and informed consent                            was obtained. Prior Anticoagulants: The patient has                            taken no anticoagulant or antiplatelet agents. ASA                            Grade Assessment: II - A patient with mild systemic                            disease. After reviewing the risks and benefits,                            the patient was deemed in satisfactory condition to                            undergo the procedure.                           After obtaining informed consent, the colonoscope  was passed under direct vision. Throughout the                            procedure, the patient's blood pressure, pulse, and                            oxygen saturations were monitored continuously. The                            [id number]-HQ190L Colonoscope [id number] was introduced                            through the anus and advanced to the the terminal                            ileum, with identification of the  appendiceal                            orifice and IC valve. The colonoscopy was performed                            without difficulty. The patient tolerated the                            procedure well. The quality of the bowel                            preparation was good. The terminal ileum, ileocecal                            valve, appendiceal orifice, and rectum were                            photographed. The bowel preparation used was SUPREP                            via split dose instruction. Scope In: 3:23:23 PM Scope Out: 3:34:40 PM Scope Withdrawal Time: 0 hours 7 minutes 7 seconds  Total Procedure Duration: 0 hours 11 minutes 17 seconds  Findings:                 The perianal and digital rectal examinations were                            normal. Pertinent negatives include normal                            sphincter tone and no palpable rectal lesions.                           A single medium-mouthed diverticulum was found in                            the cecum.  The colon (entire examined portion) appeared normal.                           The terminal ileum appeared normal.                           Non-bleeding internal hemorrhoids were found during                            retroflexion. The hemorrhoids were Grade III                            (internal hemorrhoids that prolapse but require                            manual reduction).                           No additional abnormalities were found on                            retroflexion. Complications:            No immediate complications. Estimated Blood Loss:     Estimated blood loss: none. Impression:               - Diverticulosis in the cecum.                           - The entire examined colon is normal.                           - The examined portion of the ileum was normal.                           - Non-bleeding internal hemorrhoids.                           -  No specimens collected. Recommendation:           - Patient has a contact number available for                            emergencies. The signs and symptoms of potential                            delayed complications were discussed with the                            patient. Return to normal activities tomorrow.                            Written discharge instructions were provided to the                            patient.                           -  Resume previous diet.                           - Continue present medications.                           - Recommend against further colon cancer screening                            given patient's age and lack of polyps.                           - Further recommendations will be based on                            pathology results from upper endoscopy Tobie Hellen E. Stacia, MD 10/29/2023 3:46:42 PM This report has been signed electronically.

## 2023-10-29 NOTE — Op Note (Signed)
 [facility] Patient Name: Sandra Ryan Procedure Date: 10/29/2023 2:57 PM MRN: [id number] Endoscopist: Glendia E. Stacia , MD, [id number] Age: 68 Referring MD:  Date of Birth: July 19, 1955 Gender: Female Account #: [id number] Procedure:                Upper GI endoscopy Indications:              Iron deficiency anemia Medicines:                Monitored Anesthesia Care Procedure:                Pre-Anesthesia Assessment:                           - Prior to the procedure, a History and Physical                            was performed, and patient medications and                            allergies were reviewed. The patient's tolerance of                            previous anesthesia was also reviewed. The risks                            and benefits of the procedure and the sedation                            options and risks were discussed with the patient.                            All questions were answered, and informed consent                            was obtained. Prior Anticoagulants: The patient has                            taken no anticoagulant or antiplatelet agents. ASA                            Grade Assessment: II - A patient with mild systemic                            disease. After reviewing the risks and benefits,                            the patient was deemed in satisfactory condition to                            undergo the procedure.                           After obtaining informed consent, the endoscope was  passed under direct vision. Throughout the                            procedure, the patient's blood pressure, pulse, and                            oxygen saturations were monitored continuously. The                            Olympus Scope [id number] was introduced through the                            mouth, and advanced to the second part of duodenum.                            The upper GI endoscopy  was accomplished without                            difficulty. The patient tolerated the procedure                            well. Scope In: Scope Out: Findings:                 The examined portions of the nasopharynx,                            oropharynx and larynx were normal.                           The examined esophagus was normal.                           Diffuse atrophic mucosa was found in the gastric                            antrum. Biopsies were taken with a cold forceps for                            Helicobacter pylori testing. Estimated blood loss                            was minimal.                           The exam of the stomach was otherwise normal.                           The examined duodenum was normal. Biopsies for                            histology were taken with a cold forceps for                            evaluation of celiac disease. Estimated blood loss  was minimal.                           A large diverticulum was found in the second                            portion of the duodenum. Complications:            No immediate complications. Estimated Blood Loss:     Estimated blood loss was minimal. Impression:               - The examined portions of the nasopharynx,                            oropharynx and larynx were normal.                           - Normal esophagus.                           - Gastric mucosal atrophy. Biopsied.                           - Normal examined duodenum. Biopsied.                           - Duodenal diverticulum. Recommendation:           - Patient has a contact number available for                            emergencies. The signs and symptoms of potential                            delayed complications were discussed with the                            patient. Return to normal activities tomorrow.                            Written discharge instructions were provided to the                             patient.                           - Resume previous diet.                           - Continue present medications.                           - Await pathology results. Opel Lejeune E. Stacia, MD 10/29/2023 3:41:40 PM This report has been signed electronically.

## 2023-10-29 NOTE — Progress Notes (Signed)
 Vss nad trans to pacu

## 2023-10-29 NOTE — Progress Notes (Signed)
 [facility] History and Physical   Primary Care Physician:  Lafe Domino, DO   Reason for Procedure:   Iron deficiency anemia  Plan:    EGD, colonoscopy     HPI: Sandra Ryan is a 68 y.o. female undergoing EGD and colonoscopy to evaluate iron deficiency anemia.  She has no family history of colon cancer and no chronic GI symptoms other than symptomatic hemorrhoids.  She does see blood in her stool and reports black tarry stools on occasion. Hgb 11.6, MCV 84. Iron panel from June with ferritin 7, sat 8%.  B12 122.  She had 2 small SSPs removed in 2014.  Past Medical History:  Diagnosis Date   Abnormal Pap smear    Cystocele    Diabetes mellitus without complication (HCC)    GERD (gastroesophageal reflux disease)    Hemorrhoid    Sleep apnea    Tuberculosis    took medication for 4 months    Past Surgical History:  Procedure Laterality Date   no signficant history      Prior to Admission medications   Medication Sig Start Date End Date Taking? Authorizing Provider  Accu-Chek Softclix Lancets lancets Use as instructed. Use ONCE daily.  Dx Code: E11.9 01/15/22  Yes Macario Dorothyann HERO, MD  Blood Glucose Monitoring Suppl (ACCU-CHEK AVIVA PLUS) w/Device KIT 1 Device by Does not apply route in the morning and at bedtime. 12/16/22  Yes Lafe Domino, DO  empagliflozin  (JARDIANCE ) 25 MG TABS tablet TAKE 1 TABLET(25 MG) BY MOUTH DAILY BEFORE BREAKFAST 10/14/23  Yes Lafe Domino, DO  glucose blood (ACCU-CHEK GUIDE) test strip Use ONCE daily. E11.9 06/25/22  Yes Macario Dorothyann HERO, MD  hydrocortisone  (ANUSOL -HC) 2.5 % rectal cream Place 1 Application rectally 2 (two) times daily. 09/17/23  Yes [place], PA-C  Insulin  Pen Needle 32G X 4 MM MISC Please use to inject Lantus  daily. 01/04/21  Yes Hensel, Elsie LABOR, MD  Lancets Misc. (ACCU-CHEK SOFTCLIX LANCET DEV) KIT Use with lancets 06/25/22  Yes Macario Dorothyann HERO, MD  metFORMIN  (GLUCOPHAGE ) 1000 MG tablet TAKE 1 TABLET(1000  MG) BY MOUTH TWICE DAILY WITH A MEAL 02/24/23  Yes Lafe Domino, DO  omeprazole  (PRILOSEC) 20 MG capsule Take 1 capsule (20 mg total) by mouth daily. 07/21/23  Yes Lafe Domino, DO  acetaminophen  (TYLENOL ) 500 MG tablet Take 1 tablet (500 mg total) by mouth every 6 (six) hours as needed. 03/09/21   Donzetta Rollene BRAVO, MD  aspirin  81 MG EC tablet Take 1 tablet (81 mg total) by mouth daily. Swallow whole. Patient not taking: Reported on 09/17/2023 12/10/17   Lanny Maxwell, MD  atorvastatin  (LIPITOR) 80 MG tablet TAKE 1 TABLET(80 MG) BY MOUTH DAILY 07/29/22   Macario Dorothyann HERO, MD  diclofenac  Sodium (VOLTAREN ) 1 % GEL Apply 4 g topically 4 (four) times daily. Patient not taking: Reported on 10/29/2023 04/14/23   McDiarmid, Krystal BIRCH, MD  estradiol  (ESTRACE ) 0.1 MG/GM vaginal cream Place 0.5g nightly for two weeks then twice a week after 01/14/23   McDiarmid, Krystal BIRCH, MD  glipiZIDE  (GLUCOTROL ) 5 MG tablet Take 2 tablets (10 mg total) by mouth 2 (two) times daily before a meal. 04/17/23   Lafe Domino, DO  meloxicam  (MOBIC ) 7.5 MG tablet Take 1 tablet (7.5 mg total) by mouth daily as needed for pain. Use sparingly. 08/26/22   Macario Dorothyann HERO, MD  Olopatadine  HCl 0.2 % SOLN Apply 1-2 drops to eye daily. Patient not taking: Reported on 04/14/2023 02/24/23   Lafe,  Lauraine, DO    Current Outpatient Medications  Medication Sig Dispense Refill   Accu-Chek Softclix Lancets lancets Use as instructed. Use ONCE daily.  Dx Code: E11.9 100 each 11   Blood Glucose Monitoring Suppl (ACCU-CHEK AVIVA PLUS) w/Device KIT 1 Device by Does not apply route in the morning and at bedtime. 3 kit 0   empagliflozin  (JARDIANCE ) 25 MG TABS tablet TAKE 1 TABLET(25 MG) BY MOUTH DAILY BEFORE BREAKFAST 30 tablet 11   glucose blood (ACCU-CHEK GUIDE) test strip Use ONCE daily. E11.9 100 each 12   hydrocortisone  (ANUSOL -HC) 2.5 % rectal cream Place 1 Application rectally 2 (two) times daily. 30 g 1   Insulin  Pen Needle 32G X 4 MM MISC Please use to  inject Lantus  daily. 100 each 0   Lancets Misc. (ACCU-CHEK SOFTCLIX LANCET DEV) KIT Use with lancets 1 kit 0   metFORMIN  (GLUCOPHAGE ) 1000 MG tablet TAKE 1 TABLET(1000 MG) BY MOUTH TWICE DAILY WITH A MEAL 180 tablet 3   omeprazole  (PRILOSEC) 20 MG capsule Take 1 capsule (20 mg total) by mouth daily. 30 capsule 3   acetaminophen  (TYLENOL ) 500 MG tablet Take 1 tablet (500 mg total) by mouth every 6 (six) hours as needed. 30 tablet 2   aspirin  81 MG EC tablet Take 1 tablet (81 mg total) by mouth daily. Swallow whole. (Patient not taking: Reported on 09/17/2023) 90 tablet 3   atorvastatin  (LIPITOR) 80 MG tablet TAKE 1 TABLET(80 MG) BY MOUTH DAILY 90 tablet 0   diclofenac  Sodium (VOLTAREN ) 1 % GEL Apply 4 g topically 4 (four) times daily. (Patient not taking: Reported on 10/29/2023) 350 g 0   estradiol  (ESTRACE ) 0.1 MG/GM vaginal cream Place 0.5g nightly for two weeks then twice a week after 30 g 11   glipiZIDE  (GLUCOTROL ) 5 MG tablet Take 2 tablets (10 mg total) by mouth 2 (two) times daily before a meal. 180 tablet 3   meloxicam  (MOBIC ) 7.5 MG tablet Take 1 tablet (7.5 mg total) by mouth daily as needed for pain. Use sparingly. 30 tablet 0   Olopatadine  HCl 0.2 % SOLN Apply 1-2 drops to eye daily. (Patient not taking: Reported on 04/14/2023) 2.5 mL 3   Current Facility-Administered Medications  Medication Dose Route Frequency Provider Last Rate Last Admin   0.9 %  sodium chloride  infusion  500 mL Intravenous Once Stacia Glendia BRAVO, MD        Allergies as of 10/29/2023   (No Known Allergies)    Family History  Problem Relation Age of Onset   Asthma Father    Heart disease Father    Breast cancer Neg Hx    Colon cancer Neg Hx    Esophageal cancer Neg Hx    Rectal cancer Neg Hx    Stomach cancer Neg Hx     Social History   Socioeconomic History   Marital status: Married    Spouse name: Not on file   Number of children: 6   Years of education: Not on file   Highest education level: Not  on file  Occupational History   Occupation: Retired  Tobacco Use   Smoking status: Former   Smokeless tobacco: Never   Tobacco comments:    occasional use  Vaping Use   Vaping status: Never Used  Substance and Sexual Activity   Alcohol use: No   Drug use: No   Sexual activity: Not Currently    Birth control/protection: Post-menopausal  Other Topics Concern   Not on file  Social History Narrative   Not on file   Social Drivers of Health   Financial Resource Strain: Medium Risk (07/02/2022)   Overall Financial Resource Strain (CARDIA)    Difficulty of Paying Living Expenses: Somewhat hard  Food Insecurity: No Food Insecurity (07/02/2022)   Hunger Vital Sign    Worried About Running Out of Food in the Last Year: Never true    Ran Out of Food in the Last Year: Never true  Recent Concern: Food Insecurity - Food Insecurity Present (06/21/2022)   Hunger Vital Sign    Worried About Running Out of Food in the Last Year: Sometimes true    Ran Out of Food in the Last Year: Sometimes true  Transportation Needs: No Transportation Needs (07/02/2022)   PRAPARE - Administrator, Civil Service (Medical): No    Lack of Transportation (Non-Medical): No  Recent Concern: Transportation Needs - Unmet Transportation Needs (06/21/2022)   PRAPARE - Administrator, Civil Service (Medical): Yes    Lack of Transportation (Non-Medical): No  Physical Activity: Inactive (07/02/2022)   Exercise Vital Sign    Days of Exercise per Week: 0 days    Minutes of Exercise per Session: 0 min  Stress: No Stress Concern Present (07/02/2022)   [employer] of Occupational Health - Occupational Stress Questionnaire    Feeling of Stress : Not at all  Social Connections: Moderately Isolated (07/02/2022)   Social Connection and Isolation Panel    Frequency of Communication with Friends and Family: Twice a week    Frequency of Social Gatherings with Friends and Family: Once a week    Attends  Religious Services: 1 to 4 times per year    Active Member of [employer] or Organizations: No    Attends Banker Meetings: Never    Marital Status: Widowed  Intimate Partner Violence: Not At Risk (06/21/2022)   Humiliation, Afraid, Rape, and Kick questionnaire    Fear of Current or Ex-Partner: No    Emotionally Abused: No    Physically Abused: No    Sexually Abused: No    Review of Systems:  All other review of systems negative except as mentioned in the HPI.  Physical Exam: Vital signs BP (!) 145/82   General:   Alert,  Well-developed, well-nourished, pleasant and cooperative in NAD Airway:  Mallampati 2 Lungs:  Clear throughout to auscultation.   Heart:  Regular rate and rhythm; no murmurs, clicks, rubs,  or gallops. Abdomen:  Soft, nontender and nondistended. Normal bowel sounds.   Neuro/Psych:  Normal mood and affect. A and O x 3   Reinhard Schack E. Stacia, MD [facility]

## 2023-10-29 NOTE — Progress Notes (Signed)
 Pt's states no medical or surgical changes since previsit or office visit.   Interpreter used today at the [facility] for this pt.  Interpreter's name is- Rexene

## 2023-10-30 ENCOUNTER — Telehealth: Payer: Self-pay | Admitting: *Deleted

## 2023-10-30 NOTE — Telephone Encounter (Signed)
 Attempted f/u phone call. No answer. Left message.

## 2023-11-03 LAB — SURGICAL PATHOLOGY

## 2023-11-06 ENCOUNTER — Ambulatory Visit: Payer: Self-pay | Admitting: Gastroenterology

## 2023-11-06 DIAGNOSIS — D649 Anemia, unspecified: Secondary | ICD-10-CM

## 2023-11-06 NOTE — Progress Notes (Signed)
 Ms. Sandra Ryan,  The biopsies taken from your stomach were notable for mild chronic gastritis (inflammation) which is a common finding, but there was no evidence of Helicobacter pylori infection. This common finding is not likely to explain iron deficiency and there is no specific treatment or further evaluation recommended. The biopsies taken from you duodenum were unremarkable, with no evidence of celiac disease.  I recommend you continue the iron tablet and repeat a CBC and iron panel in 3 months.  Team, Can you please relay the above message and place orders for the labs above in 3 months?

## 2023-11-28 ENCOUNTER — Other Ambulatory Visit: Payer: Self-pay | Admitting: Family Medicine

## 2023-11-28 DIAGNOSIS — K219 Gastro-esophageal reflux disease without esophagitis: Secondary | ICD-10-CM

## 2023-12-04 ENCOUNTER — Other Ambulatory Visit (INDEPENDENT_AMBULATORY_CARE_PROVIDER_SITE_OTHER)

## 2023-12-04 ENCOUNTER — Encounter: Payer: Self-pay | Admitting: Physician Assistant

## 2023-12-04 ENCOUNTER — Ambulatory Visit: Admitting: Physician Assistant

## 2023-12-04 VITALS — BP 120/70 | HR 83 | Ht 60.0 in | Wt 119.0 lb

## 2023-12-04 DIAGNOSIS — K2970 Gastritis, unspecified, without bleeding: Secondary | ICD-10-CM

## 2023-12-04 DIAGNOSIS — D509 Iron deficiency anemia, unspecified: Secondary | ICD-10-CM | POA: Diagnosis not present

## 2023-12-04 DIAGNOSIS — E538 Deficiency of other specified B group vitamins: Secondary | ICD-10-CM

## 2023-12-04 DIAGNOSIS — K648 Other hemorrhoids: Secondary | ICD-10-CM | POA: Diagnosis not present

## 2023-12-04 DIAGNOSIS — K2930 Chronic superficial gastritis without bleeding: Secondary | ICD-10-CM

## 2023-12-04 DIAGNOSIS — Z860101 Personal history of adenomatous and serrated colon polyps: Secondary | ICD-10-CM

## 2023-12-04 LAB — CBC WITH DIFFERENTIAL/PLATELET
Basophils Absolute: 0.1 K/uL (ref 0.0–0.1)
Basophils Relative: 0.7 % (ref 0.0–3.0)
Eosinophils Absolute: 0.1 K/uL (ref 0.0–0.7)
Eosinophils Relative: 1.5 % (ref 0.0–5.0)
HCT: 38.2 % (ref 36.0–46.0)
Hemoglobin: 12.5 g/dL (ref 12.0–15.0)
Lymphocytes Relative: 25.5 % (ref 12.0–46.0)
Lymphs Abs: 1.9 K/uL (ref 0.7–4.0)
MCHC: 32.7 g/dL (ref 30.0–36.0)
MCV: 86 fl (ref 78.0–100.0)
Monocytes Absolute: 0.6 K/uL (ref 0.1–1.0)
Monocytes Relative: 7.6 % (ref 3.0–12.0)
Neutro Abs: 4.8 K/uL (ref 1.4–7.7)
Neutrophils Relative %: 64.7 % (ref 43.0–77.0)
Platelets: 266 K/uL (ref 150.0–400.0)
RBC: 4.44 Mil/uL (ref 3.87–5.11)
RDW: 13.7 % (ref 11.5–15.5)
WBC: 7.4 K/uL (ref 4.0–10.5)

## 2023-12-04 LAB — IRON,TIBC AND FERRITIN PANEL
%SAT: 23 % (ref 16–45)
Ferritin: 29 ng/mL (ref 16–288)
Iron: 82 ug/dL (ref 45–160)
TIBC: 351 ug/dL (ref 250–450)

## 2023-12-04 LAB — VITAMIN B12: Vitamin B-12: 364 pg/mL (ref 211–911)

## 2023-12-04 MED ORDER — HYDROCORTISONE ACETATE 25 MG RE SUPP: 25.0000 mg | Freq: Two times a day (BID) | RECTAL | 1 refills | Status: DC

## 2023-12-04 NOTE — Patient Instructions (Addendum)
 Your provider has requested that you go to the basement level for lab work before leaving today. Press B on the elevator. The lab is located at the first door on the left as you exit the elevator.  We have sent the following medications to your pharmacy for you to pick up at your convenience: Hydrocortisone  Suppositories 25 mg once daily at bedtime  Please follow up sooner if symptoms increase or worsen  Due to recent changes in healthcare laws, you may see the results of your imaging and laboratory studies on MyChart before your provider has had a chance to review them.  We understand that in some cases there may be results that are confusing or concerning to you. Not all laboratory results come back in the same time frame and the provider may be waiting for multiple results in order to interpret others.  Please give us  48 hours in order for your provider to thoroughly review all the results before contacting the office for clarification of your results.   Thank you for trusting me with your gastrointestinal care!   Ellouise Console, PA-C _______________________________________________________  If your blood pressure at your visit was 140/90 or greater, please contact your primary care physician to follow up on this.  _______________________________________________________  If you are age 69 or older, your body mass index should be between 23-30. Your Body mass index is 23.24 kg/m. If this is out of the aforementioned range listed, please consider follow up with your Primary Care Provider.  If you are age 69 or younger, your body mass index should be between 19-25. Your Body mass index is 23.24 kg/m. If this is out of the aformentioned range listed, please consider follow up with your Primary Care Provider.   ________________________________________________________  The [facility] providers would like to encourage you to use MYCHART to communicate with providers for non-urgent requests or  questions.  Due to long hold times on the telephone, sending your provider a message by [facility] may be a faster and more efficient way to get a response.  Please allow 48 business hours for a response.  Please remember that this is for non-urgent requests.  _______________________________________________________

## 2023-12-04 NOTE — Progress Notes (Signed)
 Ellouise Console, PA-C [address] [place], [place]  [address] Phone: [phone]   Primary Care Physician: Lafe Domino, DO  Primary Gastroenterologist:  Ellouise Console, PA-C / Glendia Holt, MD   Chief Complaint:  F/U Rectal Bleeding, IDA, Hemorrhoids       HPI:   Arliss Flood is a 68 y.o. female returns for 7-month follow-up of hemorrhoids, rectal bleeding, and iron deficiency anemia..   Patient is here with her son.  We are using a Nepali language interpreter through [facility] language services today.  10/29/2023 colonoscopy by Dr. Holt: Nonbleeding grade 3 internal hemorrhoids.  1 small diverticulum in the cecum.  Otherwise normal.  No polyps.  No evidence of IBD.  Good prep.  No further colonoscopies recommended due to advanced age.  10/29/2023 EGD: Normal esophagus.  Mild gastritis.  Normal duodenum.  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  Biopsies were negative for celiac and H. pylori.  There was mild chronic inactive gastritis.  08/2023 last labs: Hemoglobin 11.6, hematocrit 35, MCV 84, low iron 37, ferritin 7, saturation 8.3.  Low vitamin B12 of 122.  Normal folate.  She was started on OTC iron and B12 supplements.   Current GI symptoms: Patient has not had any more episodes of rectal bleeding since her colonoscopy procedure a month ago.  She has mild occasional constipation.  She denies abdominal pain, weight loss, or any other GI symptoms.  She still feels protruding hemorrhoid at times.  We discussed treatment for internal hemorrhoids at length.   03/2012 colonoscopy by Dr. Teressa showed 2 small (2 to 3 mm) sessile serrated adenoma polyps removed, small internal hemorrhoids, otherwise normal.  Good prep.  Overdue for repeat Colonoscopy.   PMH: Hemorrhoids, GERD, diabetes, diabetic retinopathy, peripheral neuropathy, OA, history of TB.    Current Outpatient Medications  Medication Sig Dispense Refill   Accu-Chek Softclix Lancets  lancets Use as instructed. Use ONCE daily.  Dx Code: E11.9 100 each 11   acetaminophen  (TYLENOL ) 500 MG tablet Take 1 tablet (500 mg total) by mouth every 6 (six) hours as needed. 30 tablet 2   Blood Glucose Monitoring Suppl (ACCU-CHEK AVIVA PLUS) w/Device KIT 1 Device by Does not apply route in the morning and at bedtime. 3 kit 0   empagliflozin  (JARDIANCE ) 25 MG TABS tablet TAKE 1 TABLET(25 MG) BY MOUTH DAILY BEFORE BREAKFAST 30 tablet 11   glipiZIDE  (GLUCOTROL ) 5 MG tablet Take 2 tablets (10 mg total) by mouth 2 (two) times daily before a meal. 180 tablet 3   glucose blood (ACCU-CHEK GUIDE) test strip Use ONCE daily. E11.9 100 each 12   Insulin  Pen Needle 32G X 4 MM MISC Please use to inject Lantus  daily. 100 each 0   Lancets Misc. (ACCU-CHEK SOFTCLIX LANCET DEV) KIT Use with lancets 1 kit 0   meloxicam  (MOBIC ) 7.5 MG tablet Take 1 tablet (7.5 mg total) by mouth daily as needed for pain. Use sparingly. 30 tablet 0   metFORMIN  (GLUCOPHAGE ) 1000 MG tablet TAKE 1 TABLET(1000 MG) BY MOUTH TWICE DAILY WITH A MEAL 180 tablet 3   omeprazole  (PRILOSEC) 20 MG capsule TAKE 1 CAPSULE(20 MG) BY MOUTH DAILY 30 capsule 3   No current facility-administered medications for this visit.    Allergies as of 12/04/2023   (No Known Allergies)    Past Medical History:  Diagnosis Date   Abnormal Pap smear    Cystocele    Diabetes mellitus without complication (  HCC)    GERD (gastroesophageal reflux disease)    Hemorrhoid    IDA (iron deficiency anemia)    Sleep apnea    Tuberculosis    took medication for 4 months    Past Surgical History:  Procedure Laterality Date   colonoscopy     ESOPHAGOGASTRODUODENOSCOPY      Review of Systems:    All systems reviewed and negative except where noted in HPI.    Physical Exam:  BP 120/70   Pulse 83   Ht 5' (1.524 m)   Wt 119 lb (54 kg)   BMI 23.24 kg/m  No LMP recorded. Patient is postmenopausal.  General: Well-nourished, well-developed in no acute  distress.  Neuro: Alert and oriented x 3.  Grossly intact.  Psych: Alert and cooperative, normal mood and affect.   Imaging Studies: No results found.  Labs: CBC    Component Value Date/Time   WBC 7.1 09/17/2023 1636   RBC 4.28 09/17/2023 1636   HGB 11.6 (L) 09/17/2023 1636   HGB 12.3 10/01/2017 1131   HCT 35.9 (L) 09/17/2023 1636   HCT 38.2 10/01/2017 1131   PLT 323.0 09/17/2023 1636   PLT 333 10/01/2017 1131   MCV 84.0 09/17/2023 1636   MCV 88 10/01/2017 1131   MCH 28.3 10/01/2017 1131   MCH 28.9 03/12/2016 1621   MCHC 32.2 09/17/2023 1636   RDW 14.9 09/17/2023 1636   RDW 14.3 10/01/2017 1131   LYMPHSABS 2.4 09/17/2023 1636   MONOABS 0.6 09/17/2023 1636   EOSABS 0.1 09/17/2023 1636   BASOSABS 0.1 09/17/2023 1636    CMP     Component Value Date/Time   NA 138 07/24/2023 1428   K 4.9 07/24/2023 1428   CL 100 07/24/2023 1428   CO2 21 07/24/2023 1428   GLUCOSE 282 (H) 07/24/2023 1428   GLUCOSE 147 (A) 12/16/2022 1542   BUN 11 07/24/2023 1428   CREATININE 0.89 07/24/2023 1428   CREATININE 0.91 03/12/2016 1621   CALCIUM  9.7 07/24/2023 1428   PROT 7.8 03/12/2016 1621   ALBUMIN 4.4 03/12/2016 1621   AST 19 03/12/2016 1621   ALT 18 03/12/2016 1621   ALKPHOS 50 03/12/2016 1621   BILITOT 0.3 03/12/2016 1621   GFRNONAA 83 02/01/2020 1529   GFRNONAA 68 03/12/2016 1621   GFRAA 95 02/01/2020 1529   GFRAA 79 03/12/2016 1621       Assessment and Plan:   Arlanda Warburton is a 68 y.o. y/o female returns for follow-up of rectal bleeding attributed to internal hemorrhoids.  Recent colonoscopy showed no polyps, colon cancer, or IBD.  She has had anemia attributed to iron deficiency and B12 deficiency.  Recent EGD showed no evidence of celiac, H. pylori or stomach ulcers.  EGD showed mild gastritis.  1.  Internal hemorrhoids - Rx Hydrocortisone  Suppositories 25mg  Insert 1 into rectum twice daily for 7 days.   - Stressed importance of treating underlying constipation. -  Recommend OTC MiraLAX  powder or Colace stool softener to treat mild constipation. - Discussed Internal Hemorrhoid Banding if no improvement with conservative treament.   2.  Iron deficiency anemia - Continue ferrous sulfate 325 mg daily - Labs CBC, iron panel - Decide about stopping iron based on lab results. - Follow-up with PCP to monitor labs in the future.  3.  B12 deficiency - Continue OTC vitamin B12 - Labs vitamin B12  4.  Mild gastritis (H. pylori negative) - Continue Omeprazole  20mg  once daily. - Avoid NSAIDs   Ellouise Console,  PA-C  Follow up as needed if GI symptoms worsen or persist.

## 2023-12-05 ENCOUNTER — Telehealth: Payer: Self-pay | Admitting: Physician Assistant

## 2023-12-05 ENCOUNTER — Ambulatory Visit: Payer: Self-pay | Admitting: Physician Assistant

## 2023-12-05 MED ORDER — HYDROCORTISONE ACETATE 25 MG RE SUPP: 25.0000 mg | Freq: Two times a day (BID) | RECTAL | 1 refills | Status: DC

## 2023-12-05 NOTE — Telephone Encounter (Signed)
 See 12/04/23 results follow up note for additional details.

## 2023-12-05 NOTE — Progress Notes (Signed)
 Patient needs Nepali interpreter. Call and notify patient and her son labs have improved to normal. 1.  Iron has improved to normal. 2.  Vitamin B12 has improved to normal. 3.  Hemoglobin has improved to normal.  Anemia has resolved. Patient can stop her iron and B12 supplements.  I recommend she take an OTC general multivitamin daily which includes iron and B12. Ellouise Console, PA-C

## 2023-12-07 NOTE — Progress Notes (Signed)
 Agree with the assessment and plan as outlined by Brigitte Canard, PA-C.

## 2024-01-08 ENCOUNTER — Telehealth: Payer: Self-pay | Admitting: Physician Assistant

## 2024-01-08 NOTE — Telephone Encounter (Signed)
Left message for pt's son to call back

## 2024-01-08 NOTE — Telephone Encounter (Signed)
 Patient son requesting f/u call to discuss symptoms patient is having. Please advise.SABRA

## 2024-01-09 NOTE — Telephone Encounter (Signed)
 Spoke to patient's son, Sheril who states that patient used suppositories provided to her for internal hemorrhoids. However, states that she has not had any relief and continues with symptoms. When asked what symptoms patient is experiencing, she denies any rectal bleeding. She says that she has burning when she uses the restroom. Patient denies use of miralax  for constipation because she says she is not constipated and has normal stools.  Patient then complains that she has to sit on the toilet for 15 minutes at a time to urinate. I have asked her to elaborate on if the burning occurs with urination of if the burning is in the rectum. This remains unclear but she also says there continues to be a bump at the rectum.  Patient/son advised that patient should seek evaluation by her PCP regarding urination issues.  Please advise regarding GI symptoms.

## 2024-01-12 NOTE — Telephone Encounter (Signed)
 Left message for Sandra Ryan to call back.

## 2024-01-13 NOTE — Telephone Encounter (Signed)
 Patient's son returning call. Please advise, thank you

## 2024-01-13 NOTE — Telephone Encounter (Signed)
 Left additional voicemail to call back.

## 2024-01-13 NOTE — Telephone Encounter (Signed)
 I have spoken to patient's son Sheril and he has scheduled patient for hemorrhoidal banding with Dr Stacia on 02/05/24 at 9:30 am.

## 2024-01-17 ENCOUNTER — Other Ambulatory Visit: Payer: Self-pay | Admitting: Family Medicine

## 2024-01-17 DIAGNOSIS — N952 Postmenopausal atrophic vaginitis: Secondary | ICD-10-CM

## 2024-02-03 NOTE — Telephone Encounter (Signed)
 With help of [employer] ([place]ali) interpreter ID [phone] attempted to reach patient to remind her that she is due for repeat labs. No answer. Interpreter left voicemail asking patient to come for labs this upcoming week.  Also of note, patient is scheduled for hemorrhoidal banding on 02/05/24 and a note has been placed in scheduling notes to indicate patient need for repeat labs.

## 2024-02-05 ENCOUNTER — Encounter: Payer: Self-pay | Admitting: Gastroenterology

## 2024-02-05 ENCOUNTER — Ambulatory Visit: Admitting: Gastroenterology

## 2024-02-05 VITALS — BP 128/72 | HR 86 | Ht 64.0 in | Wt 120.0 lb

## 2024-02-05 DIAGNOSIS — K642 Third degree hemorrhoids: Secondary | ICD-10-CM

## 2024-02-05 MED ORDER — HYDROCORTISONE ACETATE 25 MG RE SUPP
25.0000 mg | Freq: Two times a day (BID) | RECTAL | 0 refills | Status: AC
Start: 2024-02-05 — End: ?

## 2024-02-05 NOTE — Patient Instructions (Signed)
 We have sent the following medications to your pharmacy for you to pick up at your convenience: Anusol  suppositories to use twice a day x 14 days.   Please contact our office if you decide to schedule a hemorrhoid banding.  _______________________________________________________  If your blood pressure at your visit was 140/90 or greater, please contact your primary care physician to follow up on this.  _______________________________________________________  If you are age 74 or older, your body mass index should be between 23-30. Your Body mass index is 20.6 kg/m. If this is out of the aforementioned range listed, please consider follow up with your Primary Care Provider.  If you are age 89 or younger, your body mass index should be between 19-25. Your Body mass index is 20.6 kg/m. If this is out of the aformentioned range listed, please consider follow up with your Primary Care Provider.   ________________________________________________________  The [facility] providers would like to encourage you to use MYCHART to communicate with providers for non-urgent requests or questions.  Due to long hold times on the telephone, sending your provider a message by [facility] may be a faster and more efficient way to get a response.  Please allow 48 business hours for a response.  Please remember that this is for non-urgent requests.  _______________________________________________________  [facility] is using a team-based approach to care.  Your team is made up of your doctor and two to three APPS. Our APPS (Nurse Practitioners and Physician Assistants) work with your physician to ensure care continuity for you. They are fully qualified to address your health concerns and develop a treatment plan. They communicate directly with your gastroenterologist to care for you. Seeing the Advanced Practice Practitioners on your physician's team can help you by facilitating care more promptly, often  allowing for earlier appointments, access to diagnostic testing, procedures, and other specialty referrals.

## 2024-02-05 NOTE — Progress Notes (Signed)
 Discussed the use of AI scribe software for clinical note transcription with the patient, who gave verbal consent to proceed.  HPI :  Sandra Ryan is a 68 year old female who presents with painful prolapsing hemorrhoids.  She experiences a burning pain during bowel movements, which has been ongoing for a couple of years and occurs every time she has a bowel movement. The hemorrhoid prolapses during bowel movements, urination, sitting, and walking, requiring her to manually reduce it.  She has previously used suppositories to reduce inflammation, which provided relief while in use, but symptoms returned immediately after discontinuation. She describes the sensation of the hemorrhoid as a 'big piece coming out' and notes that it becomes uncomfortable and burning when it prolapses.  During the review of symptoms, she mentions that the hemorrhoid sometimes prolapses when she sneezes or coughs, but it does not reduce spontaneously; she always has to manually reduce it. No pain while sitting or walking unless the hemorrhoid prolapses.   She used to have issues with rectal bleeding, but this has resolved.     10/29/2023 colonoscopy by Dr. Stacia: Nonbleeding grade 3 internal hemorrhoids.  1 small diverticulum in the cecum.  Otherwise normal.  No polyps.  No evidence of IBD.  Good prep.  No further colonoscopies recommended due to advanced age.   10/29/2023 EGD: Normal esophagus.  Mild gastritis.  Normal duodenum.  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  Biopsies were negative for celiac and H. pylori.  There was mild chronic inactive gastritis.   Past Medical History:  Diagnosis Date   Abnormal Pap smear    Cystocele    Diabetes mellitus without complication (HCC)    GERD (gastroesophageal reflux disease)    Hemorrhoid    IDA (iron deficiency anemia)    Sleep apnea    Tuberculosis    took medication for 4 months     Past Surgical History:  Procedure Laterality  Date   colonoscopy     ESOPHAGOGASTRODUODENOSCOPY     Family History  Problem Relation Age of Onset   Asthma Father    Heart disease Father    Breast cancer Neg Hx    Colon cancer Neg Hx    Esophageal cancer Neg Hx    Rectal cancer Neg Hx    Stomach cancer Neg Hx    Social History   Tobacco Use   Smoking status: Former    Types: Cigarettes   Smokeless tobacco: Never   Tobacco comments:    occasional use  Vaping Use   Vaping status: Never Used  Substance Use Topics   Alcohol use: No   Drug use: No   Current Outpatient Medications  Medication Sig Dispense Refill   Accu-Chek Softclix Lancets lancets Use as instructed. Use ONCE daily.  Dx Code: E11.9 100 each 11   acetaminophen  (TYLENOL ) 500 MG tablet Take 1 tablet (500 mg total) by mouth every 6 (six) hours as needed. 30 tablet 2   Blood Glucose Monitoring Suppl (ACCU-CHEK AVIVA PLUS) w/Device KIT 1 Device by Does not apply route in the morning and at bedtime. 3 kit 0   empagliflozin  (JARDIANCE ) 25 MG TABS tablet TAKE 1 TABLET(25 MG) BY MOUTH DAILY BEFORE BREAKFAST 30 tablet 11   glipiZIDE  (GLUCOTROL ) 5 MG tablet Take 2 tablets (10 mg total) by mouth 2 (two) times daily before a meal. 180 tablet 3   glucose blood (ACCU-CHEK GUIDE) test strip Use ONCE daily. E11.9 100 each 12  hydrocortisone  (ANUSOL -HC) 25 MG suppository Place 1 suppository (25 mg total) rectally 2 (two) times daily. 14 suppository 1   Insulin  Pen Needle 32G X 4 MM MISC Please use to inject Lantus  daily. 100 each 0   Lancets Misc. (ACCU-CHEK SOFTCLIX LANCET DEV) KIT Use with lancets 1 kit 0   meloxicam  (MOBIC ) 7.5 MG tablet Take 1 tablet (7.5 mg total) by mouth daily as needed for pain. Use sparingly. 30 tablet 0   metFORMIN  (GLUCOPHAGE ) 1000 MG tablet TAKE 1 TABLET(1000 MG) BY MOUTH TWICE DAILY WITH A MEAL 180 tablet 3   omeprazole  (PRILOSEC) 20 MG capsule TAKE 1 CAPSULE(20 MG) BY MOUTH DAILY 30 capsule 3   No current facility-administered medications for  this visit.   No Known Allergies   Review of Systems: All systems reviewed and negative except where noted in HPI.    No results found.  Physical Exam: BP 128/72   Pulse 86   Ht 5' 4 (1.626 m)   Wt 120 lb (54.4 kg)   BMI 20.60 kg/m  Constitutional: Pleasant,well-developed, Nepali female in no acute distress.  Accompanied by medical interpreter in the clinic today HEENT: Normocephalic and atraumatic. Conjunctivae are normal. No scleral icterus. Neck supple.  Cardiovascular: Normal rate, regular rhythm.  Pulmonary/chest: Effort normal and breath sounds normal. No wheezing, rales or rhonchi. Abdominal: Soft, nondistended, nontender. Bowel sounds active throughout. There are no masses palpable. No hepatomegaly. Extremities: no edema Rectal: CMA Amanda Madan present for rectal exam.  Small skin tag along left lateral aspect of anus.  Perianal exam otherwise unremarkable.  No anal fissure noted.  Digital rectal exam unremarkable.  No mass lesion.  Normal resting tone.  No significant tenderness with digital exam.  With straining, the patient is able to prolapse the hemorrhoid, which appears to be coming from the left lateral column.  Anoscopic exam notable for prominent hemorrhoid arising from the right posterior column as well as the left lateral.  Suspect that the prolapsing hemorrhoid is coming from the left lateral column based on the external appearance. Neurological: Alert and oriented to person place and time. Skin: Skin is warm and dry. No rashes noted. Psychiatric: Normal mood and affect. Behavior is normal.  CBC    Component Value Date/Time   WBC 7.4 12/04/2023 1150   RBC 4.44 12/04/2023 1150   HGB 12.5 12/04/2023 1150   HGB 12.3 10/01/2017 1131   HCT 38.2 12/04/2023 1150   HCT 38.2 10/01/2017 1131   PLT 266.0 12/04/2023 1150   PLT 333 10/01/2017 1131   MCV 86.0 12/04/2023 1150   MCV 88 10/01/2017 1131   MCH 28.3 10/01/2017 1131   MCH 28.9 03/12/2016 1621   MCHC 32.7  12/04/2023 1150   RDW 13.7 12/04/2023 1150   RDW 14.3 10/01/2017 1131   LYMPHSABS 1.9 12/04/2023 1150   MONOABS 0.6 12/04/2023 1150   EOSABS 0.1 12/04/2023 1150   BASOSABS 0.1 12/04/2023 1150    CMP     Component Value Date/Time   NA 138 07/24/2023 1428   K 4.9 07/24/2023 1428   CL 100 07/24/2023 1428   CO2 21 07/24/2023 1428   GLUCOSE 282 (H) 07/24/2023 1428   GLUCOSE 147 (A) 12/16/2022 1542   BUN 11 07/24/2023 1428   CREATININE 0.89 07/24/2023 1428   CREATININE 0.91 03/12/2016 1621   CALCIUM  9.7 07/24/2023 1428   PROT 7.8 03/12/2016 1621   ALBUMIN 4.4 03/12/2016 1621   AST 19 03/12/2016 1621   ALT 18 03/12/2016 1621  ALKPHOS 50 03/12/2016 1621   BILITOT 0.3 03/12/2016 1621   GFRNONAA 83 02/01/2020 1529   GFRNONAA 68 03/12/2016 1621   GFRAA 95 02/01/2020 1529   GFRAA 79 03/12/2016 1621       Latest Ref Rng & Units 12/04/2023   11:50 AM 09/17/2023    4:36 PM 10/01/2017   11:31 AM  CBC EXTENDED  WBC 4.0 - 10.5 K/uL 7.4  7.1  6.7   RBC 3.87 - 5.11 Mil/uL 4.44  4.28  4.34   Hemoglobin 12.0 - 15.0 g/dL 87.4  88.3  87.6   HCT 36.0 - 46.0 % 38.2  35.9  38.2   Platelets 150.0 - 400.0 K/uL 266.0  323.0  333   NEUT# 1.4 - 7.7 K/uL 4.8  3.9    Lymph# 0.7 - 4.0 K/uL 1.9  2.4        ASSESSMENT AND PLAN:  68 year old female with symptomatic grade 3 internal hemorrhoids.  She had been scheduled for banding today, but the patient had lots of questions and concerns about the banding, and is not quite ready to proceed with banding.  She would like to try using the suppositories for a little bit longer and reconsider banding in the future.  Grade 3 internal hemorrhoid Chronic prolapsing internal hemorrhoid causing discomfort and burning pain. Examination suggests hemorrhoid over other causes.  I do think that hemorrhoid banding would likely be helpful for the patient, but there is no rush or urgency to proceed with banding.  We can continue supportive care with suppositories for  now.   - Prescribed Anusol  suppositories for 14 days with refill. - Discussed hemorrhoid banding as a treatment option, involving rubber band application to induce necrosis and shedding. Explained potential discomfort post-procedure. Surgery is an alternative with a 4-6 week painful recovery. - Patient declined banding today. - Advised to contact clinic if she decides to proceed with banding.  Recording duration: 26 minutes   I spent a total of 35 minutes reviewing the patient's medical record, interviewing and examining the patient, discussing her diagnosis and management of her condition going forward, and documenting in the medical record     Linnaea Ahn E. Stacia, MD [facility]    CC:  Lafe Domino, DO

## 2024-02-26 ENCOUNTER — Other Ambulatory Visit: Payer: Self-pay | Admitting: Family Medicine

## 2024-02-26 DIAGNOSIS — E118 Type 2 diabetes mellitus with unspecified complications: Secondary | ICD-10-CM

## 2024-03-03 ENCOUNTER — Ambulatory Visit: Admitting: Family Medicine

## 2024-03-03 NOTE — Progress Notes (Deleted)
° ° °  SUBJECTIVE:   CHIEF COMPLAINT / HPI:   ***  PERTINENT  PMH / PSH: ***  OBJECTIVE:   There were no vitals taken for this visit.  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

## 2024-03-28 ENCOUNTER — Other Ambulatory Visit: Payer: Self-pay | Admitting: Family Medicine

## 2024-03-28 DIAGNOSIS — K219 Gastro-esophageal reflux disease without esophagitis: Secondary | ICD-10-CM

## 2024-03-29 DIAGNOSIS — E118 Type 2 diabetes mellitus with unspecified complications: Secondary | ICD-10-CM

## 2024-04-16 ENCOUNTER — Other Ambulatory Visit: Payer: Self-pay | Admitting: Family Medicine

## 2024-04-16 DIAGNOSIS — E118 Type 2 diabetes mellitus with unspecified complications: Secondary | ICD-10-CM

## 2024-04-20 ENCOUNTER — Telehealth: Payer: Self-pay

## 2024-04-20 DIAGNOSIS — E118 Type 2 diabetes mellitus with unspecified complications: Secondary | ICD-10-CM

## 2024-04-20 MED ORDER — METFORMIN HCL 1000 MG PO TABS: ORAL_TABLET | ORAL | 3 refills | Status: AC

## 2024-04-20 NOTE — Telephone Encounter (Signed)
 Patient's son presents to front office regarding issues with getting metformin  refill.   Called pharmacy. They did not receive refill from 12/10. They are asking if we can resend rx.   Rx resent electronically.   Chiquita JAYSON English, RN

## 2024-04-26 ENCOUNTER — Ambulatory Visit: Payer: Self-pay | Admitting: Family Medicine

## 2024-05-10 ENCOUNTER — Ambulatory Visit: Admitting: Family Medicine
